# Patient Record
Sex: Female | Born: 1975
Health system: Southern US, Community
[De-identification: ages and names within clinical notes are randomized; demographics above are authoritative.]

## PROBLEM LIST (undated history)

## (undated) DIAGNOSIS — N83209 Unspecified ovarian cyst, unspecified side: Secondary | ICD-10-CM

## (undated) DIAGNOSIS — N760 Acute vaginitis: Secondary | ICD-10-CM

## (undated) DIAGNOSIS — B9689 Other specified bacterial agents as the cause of diseases classified elsewhere: Secondary | ICD-10-CM

## (undated) DIAGNOSIS — B3731 Acute candidiasis of vulva and vagina: Secondary | ICD-10-CM

## (undated) DIAGNOSIS — Z87442 Personal history of urinary calculi: Secondary | ICD-10-CM

## (undated) DIAGNOSIS — N390 Urinary tract infection, site not specified: Secondary | ICD-10-CM

## (undated) DIAGNOSIS — N119 Chronic tubulo-interstitial nephritis, unspecified: Secondary | ICD-10-CM

## (undated) DIAGNOSIS — N200 Calculus of kidney: Secondary | ICD-10-CM

## (undated) DIAGNOSIS — T8859XA Other complications of anesthesia, initial encounter: Secondary | ICD-10-CM

## (undated) DIAGNOSIS — J45909 Unspecified asthma, uncomplicated: Secondary | ICD-10-CM

## (undated) DIAGNOSIS — I10 Essential (primary) hypertension: Secondary | ICD-10-CM

## (undated) DIAGNOSIS — T4145XA Adverse effect of unspecified anesthetic, initial encounter: Secondary | ICD-10-CM

## (undated) DIAGNOSIS — B373 Candidiasis of vulva and vagina: Secondary | ICD-10-CM

## (undated) HISTORY — PX: CYSTOSCOPY: SUR368

## (undated) HISTORY — PX: APPENDECTOMY: SHX54

## (undated) HISTORY — PX: OTHER SURGICAL HISTORY: SHX169

---

## 1999-05-27 ENCOUNTER — Inpatient Hospital Stay (HOSPITAL_COMMUNITY): Admission: AD | Admit: 1999-05-27 | Discharge: 1999-05-29 | Payer: Self-pay | Admitting: *Deleted

## 1999-06-02 ENCOUNTER — Encounter: Payer: Self-pay | Admitting: Family Medicine

## 1999-06-02 ENCOUNTER — Ambulatory Visit (HOSPITAL_COMMUNITY): Admission: RE | Admit: 1999-06-02 | Discharge: 1999-06-02 | Payer: Self-pay | Admitting: Family Medicine

## 1999-06-05 ENCOUNTER — Encounter: Admission: RE | Admit: 1999-06-05 | Discharge: 1999-06-05 | Payer: Self-pay | Admitting: Obstetrics

## 1999-06-12 ENCOUNTER — Inpatient Hospital Stay (HOSPITAL_COMMUNITY): Admission: AD | Admit: 1999-06-12 | Discharge: 1999-06-17 | Payer: Self-pay | Admitting: *Deleted

## 1999-06-20 HISTORY — PX: LITHOTRIPSY: SUR834

## 1999-06-22 ENCOUNTER — Inpatient Hospital Stay (HOSPITAL_COMMUNITY): Admission: AD | Admit: 1999-06-22 | Discharge: 1999-06-26 | Payer: Self-pay | Admitting: Obstetrics

## 1999-06-23 ENCOUNTER — Encounter: Payer: Self-pay | Admitting: Obstetrics

## 1999-06-25 ENCOUNTER — Encounter: Admission: RE | Admit: 1999-06-25 | Discharge: 1999-06-25 | Payer: Self-pay | Admitting: Obstetrics & Gynecology

## 1999-06-25 ENCOUNTER — Encounter: Payer: Self-pay | Admitting: Obstetrics

## 1999-06-26 ENCOUNTER — Encounter: Payer: Self-pay | Admitting: *Deleted

## 1999-07-01 ENCOUNTER — Inpatient Hospital Stay (HOSPITAL_COMMUNITY): Admission: AD | Admit: 1999-07-01 | Discharge: 1999-07-01 | Payer: Self-pay | Admitting: *Deleted

## 1999-07-02 ENCOUNTER — Inpatient Hospital Stay (HOSPITAL_COMMUNITY): Admission: AD | Admit: 1999-07-02 | Discharge: 1999-07-02 | Payer: Self-pay | Admitting: Obstetrics

## 1999-07-02 ENCOUNTER — Encounter: Payer: Self-pay | Admitting: Obstetrics

## 1999-07-02 ENCOUNTER — Encounter (HOSPITAL_COMMUNITY): Admission: RE | Admit: 1999-07-02 | Discharge: 1999-08-18 | Payer: Self-pay | Admitting: Obstetrics & Gynecology

## 1999-07-02 ENCOUNTER — Encounter: Admission: RE | Admit: 1999-07-02 | Discharge: 1999-07-02 | Payer: Self-pay | Admitting: Obstetrics & Gynecology

## 1999-07-16 ENCOUNTER — Inpatient Hospital Stay (HOSPITAL_COMMUNITY): Admission: AD | Admit: 1999-07-16 | Discharge: 1999-07-16 | Payer: Self-pay | Admitting: Obstetrics

## 1999-07-19 ENCOUNTER — Inpatient Hospital Stay (HOSPITAL_COMMUNITY): Admission: AD | Admit: 1999-07-19 | Discharge: 1999-07-23 | Payer: Self-pay | Admitting: *Deleted

## 1999-07-30 ENCOUNTER — Encounter: Admission: RE | Admit: 1999-07-30 | Discharge: 1999-07-30 | Payer: Self-pay | Admitting: Obstetrics & Gynecology

## 1999-08-02 ENCOUNTER — Inpatient Hospital Stay (HOSPITAL_COMMUNITY): Admission: AD | Admit: 1999-08-02 | Discharge: 1999-08-02 | Payer: Self-pay | Admitting: *Deleted

## 1999-08-13 ENCOUNTER — Encounter: Admission: RE | Admit: 1999-08-13 | Discharge: 1999-08-13 | Payer: Self-pay | Admitting: Obstetrics & Gynecology

## 1999-08-13 ENCOUNTER — Inpatient Hospital Stay (HOSPITAL_COMMUNITY): Admission: AD | Admit: 1999-08-13 | Discharge: 1999-08-13 | Payer: Self-pay | Admitting: Obstetrics

## 1999-08-15 ENCOUNTER — Inpatient Hospital Stay (HOSPITAL_COMMUNITY): Admission: AD | Admit: 1999-08-15 | Discharge: 1999-08-17 | Payer: Self-pay | Admitting: Obstetrics & Gynecology

## 1999-08-25 ENCOUNTER — Ambulatory Visit (HOSPITAL_COMMUNITY): Admission: RE | Admit: 1999-08-25 | Discharge: 1999-08-25 | Payer: Self-pay | Admitting: Urology

## 1999-08-25 ENCOUNTER — Encounter: Payer: Self-pay | Admitting: Urology

## 1999-09-15 ENCOUNTER — Emergency Department (HOSPITAL_COMMUNITY): Admission: EM | Admit: 1999-09-15 | Discharge: 1999-09-15 | Payer: Self-pay | Admitting: Emergency Medicine

## 1999-09-22 ENCOUNTER — Encounter: Payer: Self-pay | Admitting: Urology

## 1999-09-22 ENCOUNTER — Ambulatory Visit (HOSPITAL_COMMUNITY): Admission: RE | Admit: 1999-09-22 | Discharge: 1999-09-22 | Payer: Self-pay | Admitting: Urology

## 1999-09-30 ENCOUNTER — Encounter: Admission: RE | Admit: 1999-09-30 | Discharge: 1999-09-30 | Payer: Self-pay | Admitting: Obstetrics & Gynecology

## 1999-10-08 ENCOUNTER — Ambulatory Visit (HOSPITAL_COMMUNITY): Admission: RE | Admit: 1999-10-08 | Discharge: 1999-10-08 | Payer: Self-pay | Admitting: Urology

## 1999-10-08 ENCOUNTER — Encounter: Payer: Self-pay | Admitting: Urology

## 1999-10-09 ENCOUNTER — Encounter: Payer: Self-pay | Admitting: Urology

## 1999-10-09 ENCOUNTER — Ambulatory Visit (HOSPITAL_COMMUNITY): Admission: RE | Admit: 1999-10-09 | Discharge: 1999-10-09 | Payer: Self-pay | Admitting: Urology

## 1999-10-22 ENCOUNTER — Encounter: Payer: Self-pay | Admitting: Urology

## 1999-10-22 ENCOUNTER — Ambulatory Visit (HOSPITAL_COMMUNITY): Admission: RE | Admit: 1999-10-22 | Discharge: 1999-10-22 | Payer: Self-pay | Admitting: Family Medicine

## 1999-10-29 ENCOUNTER — Encounter: Payer: Self-pay | Admitting: Urology

## 1999-10-29 ENCOUNTER — Ambulatory Visit (HOSPITAL_COMMUNITY): Admission: RE | Admit: 1999-10-29 | Discharge: 1999-10-29 | Payer: Self-pay | Admitting: Urology

## 1999-11-20 ENCOUNTER — Encounter: Payer: Self-pay | Admitting: Emergency Medicine

## 1999-11-21 ENCOUNTER — Inpatient Hospital Stay (HOSPITAL_COMMUNITY): Admission: EM | Admit: 1999-11-21 | Discharge: 1999-11-26 | Payer: Self-pay | Admitting: Emergency Medicine

## 1999-11-24 ENCOUNTER — Encounter: Payer: Self-pay | Admitting: Urology

## 1999-12-02 ENCOUNTER — Ambulatory Visit (HOSPITAL_COMMUNITY): Admission: RE | Admit: 1999-12-02 | Discharge: 1999-12-02 | Payer: Self-pay | Admitting: Urology

## 1999-12-18 ENCOUNTER — Ambulatory Visit (HOSPITAL_COMMUNITY): Admission: RE | Admit: 1999-12-18 | Discharge: 1999-12-18 | Payer: Self-pay | Admitting: Urology

## 2000-05-03 ENCOUNTER — Encounter: Payer: Self-pay | Admitting: Emergency Medicine

## 2000-05-03 ENCOUNTER — Emergency Department (HOSPITAL_COMMUNITY): Admission: EM | Admit: 2000-05-03 | Discharge: 2000-05-03 | Payer: Self-pay | Admitting: Emergency Medicine

## 2000-06-07 ENCOUNTER — Inpatient Hospital Stay (HOSPITAL_COMMUNITY): Admission: AD | Admit: 2000-06-07 | Discharge: 2000-06-07 | Payer: Self-pay | Admitting: Obstetrics

## 2000-06-20 ENCOUNTER — Encounter: Payer: Self-pay | Admitting: *Deleted

## 2000-06-20 ENCOUNTER — Inpatient Hospital Stay (HOSPITAL_COMMUNITY): Admission: AD | Admit: 2000-06-20 | Discharge: 2000-06-23 | Payer: Self-pay | Admitting: *Deleted

## 2000-06-25 ENCOUNTER — Encounter: Payer: Self-pay | Admitting: Urology

## 2000-06-25 ENCOUNTER — Inpatient Hospital Stay (HOSPITAL_COMMUNITY): Admission: AD | Admit: 2000-06-25 | Discharge: 2000-06-25 | Payer: Self-pay | Admitting: Obstetrics & Gynecology

## 2000-06-25 ENCOUNTER — Ambulatory Visit (HOSPITAL_COMMUNITY): Admission: RE | Admit: 2000-06-25 | Discharge: 2000-06-25 | Payer: Self-pay | Admitting: Urology

## 2000-06-29 ENCOUNTER — Inpatient Hospital Stay (HOSPITAL_COMMUNITY): Admission: AD | Admit: 2000-06-29 | Discharge: 2000-06-29 | Payer: Self-pay | Admitting: *Deleted

## 2000-06-30 ENCOUNTER — Emergency Department (HOSPITAL_COMMUNITY): Admission: EM | Admit: 2000-06-30 | Discharge: 2000-06-30 | Payer: Self-pay | Admitting: Emergency Medicine

## 2000-07-15 ENCOUNTER — Encounter: Admission: RE | Admit: 2000-07-15 | Discharge: 2000-07-15 | Payer: Self-pay | Admitting: Obstetrics

## 2000-07-21 ENCOUNTER — Encounter: Admission: RE | Admit: 2000-07-21 | Discharge: 2000-07-21 | Payer: Self-pay | Admitting: Obstetrics & Gynecology

## 2000-07-30 ENCOUNTER — Encounter: Payer: Self-pay | Admitting: Emergency Medicine

## 2000-07-30 ENCOUNTER — Encounter: Payer: Self-pay | Admitting: Obstetrics

## 2000-07-31 ENCOUNTER — Inpatient Hospital Stay (HOSPITAL_COMMUNITY): Admission: AD | Admit: 2000-07-31 | Discharge: 2000-08-02 | Payer: Self-pay | Admitting: Obstetrics

## 2000-10-06 ENCOUNTER — Encounter: Admission: RE | Admit: 2000-10-06 | Discharge: 2000-10-06 | Payer: Self-pay | Admitting: Obstetrics & Gynecology

## 2000-10-14 ENCOUNTER — Inpatient Hospital Stay (HOSPITAL_COMMUNITY): Admission: AD | Admit: 2000-10-14 | Discharge: 2000-10-20 | Payer: Self-pay | Admitting: Obstetrics & Gynecology

## 2000-10-15 ENCOUNTER — Encounter: Payer: Self-pay | Admitting: *Deleted

## 2000-10-15 ENCOUNTER — Encounter: Payer: Self-pay | Admitting: Urology

## 2000-10-16 ENCOUNTER — Encounter: Payer: Self-pay | Admitting: *Deleted

## 2000-10-18 ENCOUNTER — Encounter: Payer: Self-pay | Admitting: Urology

## 2000-10-19 HISTORY — PX: TUBAL LIGATION: SHX77

## 2000-10-20 ENCOUNTER — Inpatient Hospital Stay (HOSPITAL_COMMUNITY): Admission: AD | Admit: 2000-10-20 | Discharge: 2000-10-20 | Payer: Self-pay | Admitting: Obstetrics

## 2000-10-28 ENCOUNTER — Inpatient Hospital Stay (HOSPITAL_COMMUNITY): Admission: AD | Admit: 2000-10-28 | Discharge: 2000-10-28 | Payer: Self-pay | Admitting: Obstetrics & Gynecology

## 2000-10-29 ENCOUNTER — Encounter (INDEPENDENT_AMBULATORY_CARE_PROVIDER_SITE_OTHER): Payer: Self-pay | Admitting: Specialist

## 2000-10-29 ENCOUNTER — Inpatient Hospital Stay (HOSPITAL_COMMUNITY): Admission: AD | Admit: 2000-10-29 | Discharge: 2000-10-31 | Payer: Self-pay | Admitting: *Deleted

## 2000-11-24 ENCOUNTER — Encounter: Payer: Self-pay | Admitting: Urology

## 2000-11-24 ENCOUNTER — Ambulatory Visit (HOSPITAL_COMMUNITY): Admission: RE | Admit: 2000-11-24 | Discharge: 2000-11-24 | Payer: Self-pay | Admitting: Urology

## 2000-12-02 ENCOUNTER — Encounter: Payer: Self-pay | Admitting: Urology

## 2000-12-02 ENCOUNTER — Ambulatory Visit (HOSPITAL_COMMUNITY): Admission: RE | Admit: 2000-12-02 | Discharge: 2000-12-02 | Payer: Self-pay | Admitting: Urology

## 2000-12-15 ENCOUNTER — Ambulatory Visit (HOSPITAL_COMMUNITY): Admission: RE | Admit: 2000-12-15 | Discharge: 2000-12-15 | Payer: Self-pay | Admitting: Urology

## 2000-12-15 ENCOUNTER — Encounter: Payer: Self-pay | Admitting: Urology

## 2002-03-25 ENCOUNTER — Encounter: Payer: Self-pay | Admitting: Emergency Medicine

## 2002-03-25 ENCOUNTER — Emergency Department (HOSPITAL_COMMUNITY): Admission: EM | Admit: 2002-03-25 | Discharge: 2002-03-26 | Payer: Self-pay | Admitting: *Deleted

## 2002-05-12 ENCOUNTER — Emergency Department (HOSPITAL_COMMUNITY): Admission: EM | Admit: 2002-05-12 | Discharge: 2002-05-12 | Payer: Self-pay | Admitting: Emergency Medicine

## 2002-06-07 ENCOUNTER — Emergency Department (HOSPITAL_COMMUNITY): Admission: EM | Admit: 2002-06-07 | Discharge: 2002-06-07 | Payer: Self-pay | Admitting: Emergency Medicine

## 2002-08-07 ENCOUNTER — Emergency Department (HOSPITAL_COMMUNITY): Admission: EM | Admit: 2002-08-07 | Discharge: 2002-08-07 | Payer: Self-pay | Admitting: Emergency Medicine

## 2002-12-06 ENCOUNTER — Encounter: Payer: Self-pay | Admitting: *Deleted

## 2002-12-07 ENCOUNTER — Encounter: Payer: Self-pay | Admitting: Urology

## 2002-12-07 ENCOUNTER — Observation Stay (HOSPITAL_COMMUNITY): Admission: AD | Admit: 2002-12-07 | Discharge: 2002-12-09 | Payer: Self-pay | Admitting: Urology

## 2003-01-03 ENCOUNTER — Emergency Department (HOSPITAL_COMMUNITY): Admission: EM | Admit: 2003-01-03 | Discharge: 2003-01-03 | Payer: Self-pay | Admitting: Emergency Medicine

## 2003-01-22 ENCOUNTER — Ambulatory Visit (HOSPITAL_BASED_OUTPATIENT_CLINIC_OR_DEPARTMENT_OTHER): Admission: RE | Admit: 2003-01-22 | Discharge: 2003-01-22 | Payer: Self-pay | Admitting: Urology

## 2003-01-25 ENCOUNTER — Emergency Department (HOSPITAL_COMMUNITY): Admission: AD | Admit: 2003-01-25 | Discharge: 2003-01-25 | Payer: Self-pay | Admitting: Emergency Medicine

## 2003-03-04 ENCOUNTER — Emergency Department (HOSPITAL_COMMUNITY): Admission: EM | Admit: 2003-03-04 | Discharge: 2003-03-04 | Payer: Self-pay

## 2003-04-14 ENCOUNTER — Emergency Department (HOSPITAL_COMMUNITY): Admission: AD | Admit: 2003-04-14 | Discharge: 2003-04-14 | Payer: Self-pay | Admitting: Emergency Medicine

## 2003-04-16 ENCOUNTER — Observation Stay (HOSPITAL_COMMUNITY): Admission: EM | Admit: 2003-04-16 | Discharge: 2003-04-17 | Payer: Self-pay | Admitting: Emergency Medicine

## 2004-02-27 ENCOUNTER — Emergency Department (HOSPITAL_COMMUNITY): Admission: EM | Admit: 2004-02-27 | Discharge: 2004-02-27 | Payer: Self-pay | Admitting: Emergency Medicine

## 2004-07-05 ENCOUNTER — Emergency Department (HOSPITAL_COMMUNITY): Admission: EM | Admit: 2004-07-05 | Discharge: 2004-07-05 | Payer: Self-pay | Admitting: Emergency Medicine

## 2005-01-08 ENCOUNTER — Encounter: Admission: RE | Admit: 2005-01-08 | Discharge: 2005-01-08 | Payer: Self-pay | Admitting: Orthopedic Surgery

## 2005-04-27 ENCOUNTER — Emergency Department (HOSPITAL_COMMUNITY): Admission: EM | Admit: 2005-04-27 | Discharge: 2005-04-27 | Payer: Self-pay | Admitting: Emergency Medicine

## 2005-04-28 ENCOUNTER — Emergency Department (HOSPITAL_COMMUNITY): Admission: EM | Admit: 2005-04-28 | Discharge: 2005-04-29 | Payer: Self-pay | Admitting: Emergency Medicine

## 2006-02-15 ENCOUNTER — Emergency Department (HOSPITAL_COMMUNITY): Admission: EM | Admit: 2006-02-15 | Discharge: 2006-02-16 | Payer: Self-pay | Admitting: Emergency Medicine

## 2006-02-20 ENCOUNTER — Inpatient Hospital Stay (HOSPITAL_COMMUNITY): Admission: EM | Admit: 2006-02-20 | Discharge: 2006-02-25 | Payer: Self-pay | Admitting: Emergency Medicine

## 2006-02-26 ENCOUNTER — Emergency Department (HOSPITAL_COMMUNITY): Admission: EM | Admit: 2006-02-26 | Discharge: 2006-02-26 | Payer: Self-pay | Admitting: Emergency Medicine

## 2006-02-27 ENCOUNTER — Emergency Department (HOSPITAL_COMMUNITY): Admission: EM | Admit: 2006-02-27 | Discharge: 2006-02-28 | Payer: Self-pay | Admitting: Emergency Medicine

## 2006-03-31 ENCOUNTER — Emergency Department (HOSPITAL_COMMUNITY): Admission: EM | Admit: 2006-03-31 | Discharge: 2006-04-01 | Payer: Self-pay | Admitting: Emergency Medicine

## 2006-04-14 ENCOUNTER — Emergency Department (HOSPITAL_COMMUNITY): Admission: EM | Admit: 2006-04-14 | Discharge: 2006-04-15 | Payer: Self-pay | Admitting: Emergency Medicine

## 2006-04-16 ENCOUNTER — Inpatient Hospital Stay (HOSPITAL_COMMUNITY): Admission: EM | Admit: 2006-04-16 | Discharge: 2006-04-19 | Payer: Self-pay | Admitting: Emergency Medicine

## 2006-05-13 ENCOUNTER — Emergency Department (HOSPITAL_COMMUNITY): Admission: EM | Admit: 2006-05-13 | Discharge: 2006-05-13 | Payer: Self-pay | Admitting: Emergency Medicine

## 2007-09-23 ENCOUNTER — Emergency Department (HOSPITAL_COMMUNITY): Admission: EM | Admit: 2007-09-23 | Discharge: 2007-09-23 | Payer: Self-pay | Admitting: Family Medicine

## 2007-10-26 ENCOUNTER — Emergency Department (HOSPITAL_COMMUNITY): Admission: EM | Admit: 2007-10-26 | Discharge: 2007-10-26 | Payer: Self-pay | Admitting: Emergency Medicine

## 2007-10-27 ENCOUNTER — Inpatient Hospital Stay (HOSPITAL_COMMUNITY): Admission: EM | Admit: 2007-10-27 | Discharge: 2007-10-30 | Payer: Self-pay | Admitting: Emergency Medicine

## 2007-11-25 ENCOUNTER — Emergency Department (HOSPITAL_COMMUNITY): Admission: EM | Admit: 2007-11-25 | Discharge: 2007-11-25 | Payer: Self-pay | Admitting: Emergency Medicine

## 2007-12-18 ENCOUNTER — Emergency Department (HOSPITAL_COMMUNITY): Admission: EM | Admit: 2007-12-18 | Discharge: 2007-12-18 | Payer: Self-pay | Admitting: Emergency Medicine

## 2008-01-14 ENCOUNTER — Emergency Department (HOSPITAL_COMMUNITY): Admission: EM | Admit: 2008-01-14 | Discharge: 2008-01-14 | Payer: Self-pay | Admitting: Emergency Medicine

## 2008-01-28 ENCOUNTER — Emergency Department (HOSPITAL_COMMUNITY): Admission: EM | Admit: 2008-01-28 | Discharge: 2008-01-28 | Payer: Self-pay | Admitting: Emergency Medicine

## 2008-03-24 ENCOUNTER — Emergency Department (HOSPITAL_COMMUNITY): Admission: EM | Admit: 2008-03-24 | Discharge: 2008-03-24 | Payer: Self-pay | Admitting: Emergency Medicine

## 2008-03-29 ENCOUNTER — Emergency Department (HOSPITAL_COMMUNITY): Admission: EM | Admit: 2008-03-29 | Discharge: 2008-03-29 | Payer: Self-pay | Admitting: Emergency Medicine

## 2008-04-21 ENCOUNTER — Emergency Department (HOSPITAL_COMMUNITY): Admission: EM | Admit: 2008-04-21 | Discharge: 2008-04-21 | Payer: Self-pay | Admitting: Emergency Medicine

## 2008-05-14 ENCOUNTER — Emergency Department (HOSPITAL_COMMUNITY): Admission: EM | Admit: 2008-05-14 | Discharge: 2008-05-14 | Payer: Self-pay | Admitting: Emergency Medicine

## 2008-05-30 ENCOUNTER — Emergency Department (HOSPITAL_COMMUNITY): Admission: EM | Admit: 2008-05-30 | Discharge: 2008-05-30 | Payer: Self-pay | Admitting: Emergency Medicine

## 2008-06-13 ENCOUNTER — Emergency Department (HOSPITAL_COMMUNITY): Admission: EM | Admit: 2008-06-13 | Discharge: 2008-06-13 | Payer: Self-pay | Admitting: Emergency Medicine

## 2008-07-08 ENCOUNTER — Emergency Department (HOSPITAL_COMMUNITY): Admission: EM | Admit: 2008-07-08 | Discharge: 2008-07-09 | Payer: Self-pay | Admitting: Emergency Medicine

## 2008-07-11 ENCOUNTER — Emergency Department (HOSPITAL_COMMUNITY): Admission: EM | Admit: 2008-07-11 | Discharge: 2008-07-11 | Payer: Self-pay | Admitting: Emergency Medicine

## 2008-09-02 ENCOUNTER — Emergency Department (HOSPITAL_COMMUNITY): Admission: EM | Admit: 2008-09-02 | Discharge: 2008-09-02 | Payer: Self-pay | Admitting: Emergency Medicine

## 2008-09-28 ENCOUNTER — Emergency Department (HOSPITAL_COMMUNITY): Admission: EM | Admit: 2008-09-28 | Discharge: 2008-09-28 | Payer: Self-pay | Admitting: Emergency Medicine

## 2008-11-24 ENCOUNTER — Emergency Department (HOSPITAL_COMMUNITY): Admission: EM | Admit: 2008-11-24 | Discharge: 2008-11-24 | Payer: Self-pay | Admitting: Emergency Medicine

## 2009-02-11 ENCOUNTER — Inpatient Hospital Stay (HOSPITAL_COMMUNITY): Admission: EM | Admit: 2009-02-11 | Discharge: 2009-02-14 | Payer: Self-pay | Admitting: Emergency Medicine

## 2009-03-07 ENCOUNTER — Inpatient Hospital Stay (HOSPITAL_COMMUNITY): Admission: EM | Admit: 2009-03-07 | Discharge: 2009-03-08 | Payer: Self-pay | Admitting: Emergency Medicine

## 2009-03-29 ENCOUNTER — Emergency Department (HOSPITAL_COMMUNITY): Admission: EM | Admit: 2009-03-29 | Discharge: 2009-03-30 | Payer: Self-pay | Admitting: Emergency Medicine

## 2009-04-13 ENCOUNTER — Inpatient Hospital Stay (HOSPITAL_COMMUNITY): Admission: EM | Admit: 2009-04-13 | Discharge: 2009-04-17 | Payer: Self-pay | Admitting: Emergency Medicine

## 2009-06-07 ENCOUNTER — Inpatient Hospital Stay (HOSPITAL_COMMUNITY): Admission: EM | Admit: 2009-06-07 | Discharge: 2009-06-09 | Payer: Self-pay | Admitting: Emergency Medicine

## 2009-06-10 ENCOUNTER — Emergency Department (HOSPITAL_COMMUNITY): Admission: EM | Admit: 2009-06-10 | Discharge: 2009-06-10 | Payer: Self-pay | Admitting: Emergency Medicine

## 2009-07-15 ENCOUNTER — Emergency Department (HOSPITAL_COMMUNITY): Admission: EM | Admit: 2009-07-15 | Discharge: 2009-07-15 | Payer: Self-pay | Admitting: Emergency Medicine

## 2009-07-17 ENCOUNTER — Emergency Department (HOSPITAL_COMMUNITY): Admission: EM | Admit: 2009-07-17 | Discharge: 2009-07-17 | Payer: Self-pay | Admitting: Emergency Medicine

## 2009-07-30 ENCOUNTER — Emergency Department (HOSPITAL_COMMUNITY): Admission: EM | Admit: 2009-07-30 | Discharge: 2009-07-30 | Payer: Self-pay | Admitting: Emergency Medicine

## 2009-10-07 ENCOUNTER — Emergency Department (HOSPITAL_COMMUNITY): Admission: EM | Admit: 2009-10-07 | Discharge: 2009-10-07 | Payer: Self-pay | Admitting: Emergency Medicine

## 2009-10-31 ENCOUNTER — Emergency Department (HOSPITAL_COMMUNITY): Admission: EM | Admit: 2009-10-31 | Discharge: 2009-11-01 | Payer: Self-pay | Admitting: Emergency Medicine

## 2010-01-20 ENCOUNTER — Inpatient Hospital Stay (HOSPITAL_COMMUNITY): Admission: EM | Admit: 2010-01-20 | Discharge: 2010-01-23 | Payer: Self-pay | Admitting: Emergency Medicine

## 2010-02-07 ENCOUNTER — Inpatient Hospital Stay (HOSPITAL_COMMUNITY): Admission: EM | Admit: 2010-02-07 | Discharge: 2010-02-12 | Payer: Self-pay | Admitting: Emergency Medicine

## 2010-03-04 ENCOUNTER — Emergency Department (HOSPITAL_COMMUNITY): Admission: EM | Admit: 2010-03-04 | Discharge: 2010-03-04 | Payer: Self-pay | Admitting: Emergency Medicine

## 2010-04-09 ENCOUNTER — Emergency Department (HOSPITAL_COMMUNITY): Admission: EM | Admit: 2010-04-09 | Discharge: 2010-04-09 | Payer: Self-pay | Admitting: Emergency Medicine

## 2010-04-11 ENCOUNTER — Emergency Department (HOSPITAL_COMMUNITY): Admission: EM | Admit: 2010-04-11 | Discharge: 2010-04-11 | Payer: Self-pay | Admitting: Emergency Medicine

## 2010-05-12 ENCOUNTER — Emergency Department (HOSPITAL_COMMUNITY): Admission: EM | Admit: 2010-05-12 | Discharge: 2010-05-12 | Payer: Self-pay | Admitting: Emergency Medicine

## 2010-06-08 ENCOUNTER — Emergency Department (HOSPITAL_COMMUNITY): Admission: EM | Admit: 2010-06-08 | Discharge: 2010-06-08 | Payer: Self-pay | Admitting: Emergency Medicine

## 2010-07-07 ENCOUNTER — Emergency Department (HOSPITAL_COMMUNITY): Admission: EM | Admit: 2010-07-07 | Discharge: 2010-07-07 | Payer: Self-pay | Admitting: Emergency Medicine

## 2010-07-10 ENCOUNTER — Emergency Department (HOSPITAL_COMMUNITY): Admission: EM | Admit: 2010-07-10 | Discharge: 2010-07-10 | Payer: Self-pay | Admitting: Emergency Medicine

## 2010-07-23 ENCOUNTER — Emergency Department (HOSPITAL_COMMUNITY): Admission: EM | Admit: 2010-07-23 | Discharge: 2010-07-23 | Payer: Self-pay | Admitting: Emergency Medicine

## 2010-08-12 ENCOUNTER — Emergency Department (HOSPITAL_COMMUNITY): Admission: EM | Admit: 2010-08-12 | Discharge: 2010-08-13 | Payer: Self-pay | Admitting: Emergency Medicine

## 2010-09-08 ENCOUNTER — Emergency Department (HOSPITAL_COMMUNITY): Admission: EM | Admit: 2010-09-08 | Discharge: 2010-09-08 | Payer: Self-pay | Admitting: Emergency Medicine

## 2010-09-15 ENCOUNTER — Emergency Department (HOSPITAL_COMMUNITY): Admission: EM | Admit: 2010-09-15 | Discharge: 2010-09-16 | Payer: Self-pay | Admitting: Emergency Medicine

## 2010-10-09 ENCOUNTER — Emergency Department (HOSPITAL_COMMUNITY)
Admission: EM | Admit: 2010-10-09 | Discharge: 2010-10-09 | Payer: Self-pay | Source: Home / Self Care | Admitting: Emergency Medicine

## 2010-10-24 ENCOUNTER — Emergency Department (HOSPITAL_COMMUNITY)
Admission: EM | Admit: 2010-10-24 | Discharge: 2010-10-24 | Payer: Self-pay | Source: Home / Self Care | Admitting: Emergency Medicine

## 2010-11-03 LAB — DIFFERENTIAL
Basophils Absolute: 0 10*3/uL (ref 0.0–0.1)
Basophils Relative: 0 % (ref 0–1)
Eosinophils Absolute: 0.1 10*3/uL (ref 0.0–0.7)
Eosinophils Relative: 1 % (ref 0–5)
Lymphocytes Relative: 39 % (ref 12–46)
Lymphs Abs: 2.2 10*3/uL (ref 0.7–4.0)
Monocytes Absolute: 0.3 10*3/uL (ref 0.1–1.0)
Monocytes Relative: 6 % (ref 3–12)
Neutro Abs: 3 10*3/uL (ref 1.7–7.7)
Neutrophils Relative %: 54 % (ref 43–77)

## 2010-11-03 LAB — URINALYSIS, ROUTINE W REFLEX MICROSCOPIC
Bilirubin Urine: NEGATIVE
Hgb urine dipstick: NEGATIVE
Nitrite: NEGATIVE
Protein, ur: NEGATIVE mg/dL
Specific Gravity, Urine: 1.025 (ref 1.005–1.030)
Urine Glucose, Fasting: NEGATIVE mg/dL
Urobilinogen, UA: 1 mg/dL (ref 0.0–1.0)
pH: 6 (ref 5.0–8.0)

## 2010-11-03 LAB — CBC
HCT: 37 % (ref 36.0–46.0)
Hemoglobin: 12.3 g/dL (ref 12.0–15.0)
MCH: 32.1 pg (ref 26.0–34.0)
MCHC: 33.2 g/dL (ref 30.0–36.0)
MCV: 96.6 fL (ref 78.0–100.0)
Platelets: 255 10*3/uL (ref 150–400)
RBC: 3.83 MIL/uL — ABNORMAL LOW (ref 3.87–5.11)
RDW: 12.7 % (ref 11.5–15.5)
WBC: 5.6 10*3/uL (ref 4.0–10.5)

## 2010-11-03 LAB — BASIC METABOLIC PANEL
BUN: 8 mg/dL (ref 6–23)
CO2: 23 mEq/L (ref 19–32)
Calcium: 9 mg/dL (ref 8.4–10.5)
Chloride: 110 mEq/L (ref 96–112)
Creatinine, Ser: 0.89 mg/dL (ref 0.4–1.2)
GFR calc Af Amer: >60 mL/min (ref >60)
GFR calc non Af Amer: >60 mL/min (ref >60)
Glucose, Bld: 87 mg/dL (ref 70–99)
Potassium: 4 mEq/L (ref 3.5–5.1)
Sodium: 140 mEq/L (ref 135–145)

## 2010-11-03 LAB — URINE MICROSCOPIC-ADD ON

## 2010-11-03 LAB — POCT PREGNANCY, URINE: Preg Test, Ur: NEGATIVE

## 2010-12-29 LAB — URINALYSIS, ROUTINE W REFLEX MICROSCOPIC
Glucose, UA: NEGATIVE mg/dL
Ketones, ur: NEGATIVE mg/dL
Protein, ur: NEGATIVE mg/dL
Urobilinogen, UA: 1 mg/dL (ref 0.0–1.0)

## 2010-12-29 LAB — POCT I-STAT, CHEM 8
BUN: 10 mg/dL (ref 6–23)
Calcium, Ion: 1.16 mmol/L (ref 1.12–1.32)
Creatinine, Ser: 0.9 mg/dL (ref 0.4–1.2)
Hemoglobin: 12.9 g/dL (ref 12.0–15.0)
TCO2: 25 mmol/L (ref 0–100)

## 2010-12-29 LAB — URINE CULTURE: Colony Count: >=100000

## 2010-12-29 LAB — POCT PREGNANCY, URINE: Preg Test, Ur: NEGATIVE

## 2010-12-31 LAB — DIFFERENTIAL
Basophils Relative: 0 % (ref 0–1)
Lymphocytes Relative: 40 % (ref 12–46)
Lymphocytes Relative: 42 % (ref 12–46)
Lymphs Abs: 2.3 10*3/uL (ref 0.7–4.0)
Lymphs Abs: 2.7 10*3/uL (ref 0.7–4.0)
Monocytes Relative: 5 % (ref 3–12)
Neutro Abs: 2.8 10*3/uL (ref 1.7–7.7)
Neutro Abs: 3.5 10*3/uL (ref 1.7–7.7)
Neutrophils Relative %: 52 % (ref 43–77)
Neutrophils Relative %: 52 % (ref 43–77)

## 2010-12-31 LAB — URINE MICROSCOPIC-ADD ON

## 2010-12-31 LAB — URINALYSIS, ROUTINE W REFLEX MICROSCOPIC
Glucose, UA: NEGATIVE mg/dL
Glucose, UA: NEGATIVE mg/dL
Hgb urine dipstick: NEGATIVE
Ketones, ur: NEGATIVE mg/dL
Protein, ur: NEGATIVE mg/dL
Protein, ur: NEGATIVE mg/dL
pH: 6 (ref 5.0–8.0)

## 2010-12-31 LAB — COMPREHENSIVE METABOLIC PANEL
BUN: 7 mg/dL (ref 6–23)
CO2: 23 mEq/L (ref 19–32)
Calcium: 9.3 mg/dL (ref 8.4–10.5)
Creatinine, Ser: 0.88 mg/dL (ref 0.4–1.2)
GFR calc Af Amer: >60 mL/min (ref >60)
GFR calc non Af Amer: >60 mL/min (ref >60)
Glucose, Bld: 86 mg/dL (ref 70–99)

## 2010-12-31 LAB — CBC
HCT: 37.3 % (ref 36.0–46.0)
Hemoglobin: 12.7 g/dL (ref 12.0–15.0)
Hemoglobin: 13.3 g/dL (ref 12.0–15.0)
MCH: 33.2 pg (ref 26.0–34.0)
MCHC: 34.1 g/dL (ref 30.0–36.0)
RBC: 4 MIL/uL (ref 3.87–5.11)
WBC: 5.5 10*3/uL (ref 4.0–10.5)

## 2010-12-31 LAB — URINE CULTURE
Colony Count: >=100000
Culture  Setup Time: 201110260851

## 2010-12-31 LAB — LIPASE, BLOOD: Lipase: 24 U/L (ref 11–59)

## 2010-12-31 LAB — POCT PREGNANCY, URINE: Preg Test, Ur: NEGATIVE

## 2011-01-01 LAB — URINE MICROSCOPIC-ADD ON

## 2011-01-01 LAB — URINALYSIS, ROUTINE W REFLEX MICROSCOPIC
Glucose, UA: NEGATIVE mg/dL
Glucose, UA: NEGATIVE mg/dL
Ketones, ur: NEGATIVE mg/dL
Ketones, ur: 15 mg/dL — AB
Nitrite: NEGATIVE
Nitrite: NEGATIVE
Protein, ur: NEGATIVE mg/dL
Protein, ur: 30 mg/dL — AB
Specific Gravity, Urine: 1.029 (ref 1.005–1.030)
pH: 6 (ref 5.0–8.0)
pH: 6 (ref 5.0–8.0)
pH: 7 (ref 5.0–8.0)

## 2011-01-01 LAB — POCT I-STAT, CHEM 8
BUN: 7 mg/dL (ref 6–23)
Calcium, Ion: 0.96 mmol/L — ABNORMAL LOW (ref 1.12–1.32)
Chloride: 108 mEq/L (ref 96–112)
Chloride: 110 mEq/L (ref 96–112)
Creatinine, Ser: 0.9 mg/dL (ref 0.4–1.2)
HCT: 45 % (ref 36.0–46.0)
Potassium: 4.2 mEq/L (ref 3.5–5.1)
Sodium: 138 mEq/L (ref 135–145)

## 2011-01-01 LAB — URINE CULTURE
Colony Count: >=100000
Colony Count: >=100000
Culture  Setup Time: 201110060116

## 2011-01-01 LAB — POCT PREGNANCY, URINE: Preg Test, Ur: NEGATIVE

## 2011-01-02 LAB — URINALYSIS, ROUTINE W REFLEX MICROSCOPIC
Glucose, UA: NEGATIVE mg/dL
Hgb urine dipstick: NEGATIVE
Specific Gravity, Urine: 1.025 (ref 1.005–1.030)
pH: 6 (ref 5.0–8.0)

## 2011-01-03 LAB — URINE CULTURE: Colony Count: >=100000

## 2011-01-03 LAB — SAMPLE TO BLOOD BANK

## 2011-01-03 LAB — URINE MICROSCOPIC-ADD ON

## 2011-01-03 LAB — BASIC METABOLIC PANEL
BUN: 8 mg/dL (ref 6–23)
Calcium: 8.9 mg/dL (ref 8.4–10.5)
GFR calc non Af Amer: >60 mL/min (ref >60)
Potassium: 4 mEq/L (ref 3.5–5.1)

## 2011-01-03 LAB — URINALYSIS, ROUTINE W REFLEX MICROSCOPIC
Ketones, ur: NEGATIVE mg/dL
Nitrite: NEGATIVE
Specific Gravity, Urine: 1.019 (ref 1.005–1.030)
Urobilinogen, UA: 0.2 mg/dL (ref 0.0–1.0)
pH: 7 (ref 5.0–8.0)

## 2011-01-03 LAB — PREGNANCY, URINE: Preg Test, Ur: NEGATIVE

## 2011-01-04 LAB — URINE CULTURE: Colony Count: >=100000

## 2011-01-04 LAB — URINALYSIS, ROUTINE W REFLEX MICROSCOPIC
Bilirubin Urine: NEGATIVE
Ketones, ur: NEGATIVE mg/dL
Nitrite: NEGATIVE
Protein, ur: NEGATIVE mg/dL
Urobilinogen, UA: 1 mg/dL (ref 0.0–1.0)
pH: 6 (ref 5.0–8.0)

## 2011-01-04 LAB — URINE MICROSCOPIC-ADD ON

## 2011-01-05 LAB — URINALYSIS, ROUTINE W REFLEX MICROSCOPIC
Bilirubin Urine: NEGATIVE
Bilirubin Urine: NEGATIVE
Glucose, UA: NEGATIVE mg/dL
Hgb urine dipstick: NEGATIVE
Ketones, ur: NEGATIVE mg/dL
Ketones, ur: NEGATIVE mg/dL
Leukocytes, UA: NEGATIVE
Nitrite: NEGATIVE
Nitrite: NEGATIVE
Protein, ur: NEGATIVE mg/dL
Protein, ur: NEGATIVE mg/dL
Specific Gravity, Urine: 1.027 (ref 1.005–1.030)
Urobilinogen, UA: 1 mg/dL (ref 0.0–1.0)
Urobilinogen, UA: 1 mg/dL (ref 0.0–1.0)
pH: 7 (ref 5.0–8.0)

## 2011-01-05 LAB — CBC
HCT: 37.1 % (ref 36.0–46.0)
HCT: 38 % (ref 36.0–46.0)
Hemoglobin: 12.4 g/dL (ref 12.0–15.0)
Hemoglobin: 12.6 g/dL (ref 12.0–15.0)
MCH: 32.3 pg (ref 26.0–34.0)
MCHC: 33.1 g/dL (ref 30.0–36.0)
MCHC: 33.5 g/dL (ref 30.0–36.0)
MCV: 96.7 fL (ref 78.0–100.0)
RBC: 3.84 MIL/uL — ABNORMAL LOW (ref 3.87–5.11)
RDW: 13.6 % (ref 11.5–15.5)

## 2011-01-05 LAB — COMPREHENSIVE METABOLIC PANEL
ALT: 13 U/L (ref 0–35)
ALT: 16 U/L (ref 0–35)
Alkaline Phosphatase: 68 U/L (ref 39–117)
BUN: 8 mg/dL (ref 6–23)
CO2: 21 mEq/L (ref 19–32)
CO2: 25 mEq/L (ref 19–32)
Calcium: 8.7 mg/dL (ref 8.4–10.5)
Calcium: 8.9 mg/dL (ref 8.4–10.5)
Creatinine, Ser: 0.85 mg/dL (ref 0.4–1.2)
GFR calc Af Amer: >60 mL/min (ref >60)
GFR calc non Af Amer: >60 mL/min (ref >60)
GFR calc non Af Amer: >60 mL/min (ref >60)
Glucose, Bld: 114 mg/dL — ABNORMAL HIGH (ref 70–99)
Glucose, Bld: 91 mg/dL (ref 70–99)
Sodium: 134 mEq/L — ABNORMAL LOW (ref 135–145)
Sodium: 139 mEq/L (ref 135–145)
Total Protein: 7.5 g/dL (ref 6.0–8.3)

## 2011-01-05 LAB — URINE CULTURE
Colony Count: NO GROWTH
Culture: NO GROWTH

## 2011-01-05 LAB — DIFFERENTIAL
Basophils Relative: 1 % (ref 0–1)
Eosinophils Absolute: 0 10*3/uL (ref 0.0–0.7)
Lymphocytes Relative: 34 % (ref 12–46)
Lymphs Abs: 1.9 10*3/uL (ref 0.7–4.0)
Lymphs Abs: 2.2 10*3/uL (ref 0.7–4.0)
Monocytes Relative: 5 % (ref 3–12)
Neutrophils Relative %: 56 % (ref 43–77)
Neutrophils Relative %: 59 % (ref 43–77)

## 2011-01-05 LAB — URINE MICROSCOPIC-ADD ON

## 2011-01-05 LAB — POCT PREGNANCY, URINE: Preg Test, Ur: NEGATIVE

## 2011-01-06 LAB — URINALYSIS, ROUTINE W REFLEX MICROSCOPIC
Nitrite: POSITIVE — AB
Protein, ur: NEGATIVE mg/dL

## 2011-01-06 LAB — COMPREHENSIVE METABOLIC PANEL
ALT: 13 U/L (ref 0–35)
Alkaline Phosphatase: 64 U/L (ref 39–117)
CO2: 25 mEq/L (ref 19–32)
Calcium: 8.6 mg/dL (ref 8.4–10.5)
Chloride: 106 mEq/L (ref 96–112)
GFR calc non Af Amer: >60 mL/min (ref >60)
Glucose, Bld: 99 mg/dL (ref 70–99)
Sodium: 139 mEq/L (ref 135–145)
Total Bilirubin: 0.5 mg/dL (ref 0.3–1.2)

## 2011-01-06 LAB — CBC
Hemoglobin: 11.3 g/dL — ABNORMAL LOW (ref 12.0–15.0)
Hemoglobin: 11.6 g/dL — ABNORMAL LOW (ref 12.0–15.0)
Hemoglobin: 11.7 g/dL — ABNORMAL LOW (ref 12.0–15.0)
Hemoglobin: 12.4 g/dL (ref 12.0–15.0)
MCHC: 32.7 g/dL (ref 30.0–36.0)
MCHC: 33 g/dL (ref 30.0–36.0)
MCHC: 33 g/dL (ref 30.0–36.0)
MCHC: 33.3 g/dL (ref 30.0–36.0)
MCHC: 33.4 g/dL (ref 30.0–36.0)
MCV: 97.4 fL (ref 78.0–100.0)
MCV: 98.4 fL (ref 78.0–100.0)
MCV: 99.2 fL (ref 78.0–100.0)
Platelets: 284 10*3/uL (ref 150–400)
Platelets: 288 10*3/uL (ref 150–400)
Platelets: 309 10*3/uL (ref 150–400)
RBC: 3.46 MIL/uL — ABNORMAL LOW (ref 3.87–5.11)
RBC: 3.59 MIL/uL — ABNORMAL LOW (ref 3.87–5.11)
RBC: 3.62 MIL/uL — ABNORMAL LOW (ref 3.87–5.11)
RBC: 3.81 MIL/uL — ABNORMAL LOW (ref 3.87–5.11)
RDW: 13.6 % (ref 11.5–15.5)
RDW: 13.9 % (ref 11.5–15.5)
WBC: 4.3 10*3/uL (ref 4.0–10.5)
WBC: 7.7 10*3/uL (ref 4.0–10.5)

## 2011-01-06 LAB — BASIC METABOLIC PANEL
BUN: 9 mg/dL (ref 6–23)
CO2: 22 mEq/L (ref 19–32)
CO2: 23 mEq/L (ref 19–32)
CO2: 23 mEq/L (ref 19–32)
CO2: 24 mEq/L (ref 19–32)
Calcium: 8.1 mg/dL — ABNORMAL LOW (ref 8.4–10.5)
Calcium: 8.4 mg/dL (ref 8.4–10.5)
Calcium: 9 mg/dL (ref 8.4–10.5)
Chloride: 101 mEq/L (ref 96–112)
Chloride: 104 mEq/L (ref 96–112)
Chloride: 106 mEq/L (ref 96–112)
Creatinine, Ser: 0.85 mg/dL (ref 0.4–1.2)
Creatinine, Ser: 0.87 mg/dL (ref 0.4–1.2)
Creatinine, Ser: 0.93 mg/dL (ref 0.4–1.2)
Creatinine, Ser: 1.02 mg/dL (ref 0.4–1.2)
GFR calc Af Amer: >60 mL/min (ref >60)
GFR calc Af Amer: >60 mL/min (ref >60)
GFR calc non Af Amer: >60 mL/min (ref >60)
GFR calc non Af Amer: >60 mL/min (ref >60)
Glucose, Bld: 160 mg/dL — ABNORMAL HIGH (ref 70–99)
Glucose, Bld: 73 mg/dL (ref 70–99)
Sodium: 137 mEq/L (ref 135–145)
Sodium: 138 mEq/L (ref 135–145)
Sodium: 138 mEq/L (ref 135–145)

## 2011-01-06 LAB — DIFFERENTIAL
Basophils Relative: 0 % (ref 0–1)
Eosinophils Absolute: 0.1 10*3/uL (ref 0.0–0.7)
Monocytes Absolute: 0.3 10*3/uL (ref 0.1–1.0)
Neutrophils Relative %: 62 % (ref 43–77)

## 2011-01-06 LAB — URINE CULTURE

## 2011-01-06 LAB — PREGNANCY, URINE: Preg Test, Ur: NEGATIVE

## 2011-01-07 LAB — BASIC METABOLIC PANEL
BUN: 11 mg/dL (ref 6–23)
CO2: 21 mEq/L (ref 19–32)
CO2: 23 mEq/L (ref 19–32)
Calcium: 8.1 mg/dL — ABNORMAL LOW (ref 8.4–10.5)
Chloride: 107 mEq/L (ref 96–112)
Chloride: 108 mEq/L (ref 96–112)
Creatinine, Ser: 0.87 mg/dL (ref 0.4–1.2)
Creatinine, Ser: 1 mg/dL (ref 0.4–1.2)
GFR calc Af Amer: >60 mL/min (ref >60)
Glucose, Bld: 141 mg/dL — ABNORMAL HIGH (ref 70–99)
Potassium: 5 mEq/L (ref 3.5–5.1)

## 2011-01-07 LAB — HEPATIC FUNCTION PANEL
Alkaline Phosphatase: 57 U/L (ref 39–117)
Indirect Bilirubin: 0.5 mg/dL (ref 0.3–0.9)
Total Bilirubin: 0.7 mg/dL (ref 0.3–1.2)
Total Protein: 6.6 g/dL (ref 6.0–8.3)

## 2011-01-07 LAB — CBC
Hemoglobin: 11.9 g/dL — ABNORMAL LOW (ref 12.0–15.0)
MCHC: 32.6 g/dL (ref 30.0–36.0)
MCHC: 33 g/dL (ref 30.0–36.0)
MCV: 97.5 fL (ref 78.0–100.0)
MCV: 98.7 fL (ref 78.0–100.0)
Platelets: 235 10*3/uL (ref 150–400)
RBC: 3.69 MIL/uL — ABNORMAL LOW (ref 3.87–5.11)
RDW: 13.6 % (ref 11.5–15.5)
RDW: 13.8 % (ref 11.5–15.5)

## 2011-01-07 LAB — URINE CULTURE

## 2011-01-07 LAB — URINALYSIS, ROUTINE W REFLEX MICROSCOPIC
Ketones, ur: NEGATIVE mg/dL
Nitrite: POSITIVE — AB
Protein, ur: NEGATIVE mg/dL
Urobilinogen, UA: 1 mg/dL (ref 0.0–1.0)

## 2011-01-07 LAB — DIFFERENTIAL
Basophils Absolute: 0 10*3/uL (ref 0.0–0.1)
Basophils Relative: 1 % (ref 0–1)
Eosinophils Absolute: 0.1 10*3/uL (ref 0.0–0.7)
Neutro Abs: 1.8 10*3/uL (ref 1.7–7.7)
Neutrophils Relative %: 38 % — ABNORMAL LOW (ref 43–77)

## 2011-01-07 LAB — URINE MICROSCOPIC-ADD ON

## 2011-01-07 LAB — PREGNANCY, URINE: Preg Test, Ur: NEGATIVE

## 2011-01-07 LAB — HEMOGLOBIN A1C
Hgb A1c MFr Bld: 5.6 % (ref 4.6–6.1)
Mean Plasma Glucose: 114 mg/dL

## 2011-01-18 ENCOUNTER — Emergency Department (HOSPITAL_BASED_OUTPATIENT_CLINIC_OR_DEPARTMENT_OTHER)
Admission: EM | Admit: 2011-01-18 | Discharge: 2011-01-18 | Disposition: A | Discharge status: Home / Self Care | Payer: Self-pay | Attending: Emergency Medicine | Admitting: Emergency Medicine

## 2011-01-18 ENCOUNTER — Emergency Department (INDEPENDENT_AMBULATORY_CARE_PROVIDER_SITE_OTHER): Payer: Self-pay

## 2011-01-18 DIAGNOSIS — R109 Unspecified abdominal pain: Secondary | ICD-10-CM

## 2011-01-18 DIAGNOSIS — N390 Urinary tract infection, site not specified: Secondary | ICD-10-CM | POA: Insufficient documentation

## 2011-01-18 DIAGNOSIS — I10 Essential (primary) hypertension: Secondary | ICD-10-CM | POA: Insufficient documentation

## 2011-01-18 LAB — DIFFERENTIAL
Eosinophils Relative: 1 % (ref 0–5)
Lymphocytes Relative: 36 % (ref 12–46)
Lymphs Abs: 2.6 10*3/uL (ref 0.7–4.0)
Monocytes Relative: 11 % (ref 3–12)

## 2011-01-18 LAB — URINALYSIS, ROUTINE W REFLEX MICROSCOPIC
Hgb urine dipstick: NEGATIVE
Nitrite: NEGATIVE
Protein, ur: NEGATIVE mg/dL
Urobilinogen, UA: 1 mg/dL (ref 0.0–1.0)

## 2011-01-18 LAB — URINE MICROSCOPIC-ADD ON

## 2011-01-18 LAB — BASIC METABOLIC PANEL
BUN: 10 mg/dL (ref 6–23)
Chloride: 105 mEq/L (ref 96–112)
GFR calc non Af Amer: >60 mL/min (ref >60)
Glucose, Bld: 71 mg/dL (ref 70–99)
Potassium: 4.3 mEq/L (ref 3.5–5.1)

## 2011-01-18 LAB — CBC
HCT: 34.5 % — ABNORMAL LOW (ref 36.0–46.0)
MCV: 94 fL (ref 78.0–100.0)
RDW: 12.2 % (ref 11.5–15.5)
WBC: 7.2 10*3/uL (ref 4.0–10.5)

## 2011-01-18 LAB — PREGNANCY, URINE: Preg Test, Ur: NEGATIVE

## 2011-01-19 LAB — URINALYSIS, ROUTINE W REFLEX MICROSCOPIC
Glucose, UA: NEGATIVE mg/dL
pH: 5.5 (ref 5.0–8.0)

## 2011-01-19 LAB — URINE MICROSCOPIC-ADD ON

## 2011-01-19 LAB — URINE CULTURE

## 2011-01-19 LAB — DIFFERENTIAL
Eosinophils Absolute: 0.1 10*3/uL (ref 0.0–0.7)
Eosinophils Relative: 1 % (ref 0–5)
Lymphs Abs: 1.9 10*3/uL (ref 0.7–4.0)
Monocytes Relative: 7 % (ref 3–12)
Neutrophils Relative %: 48 % (ref 43–77)

## 2011-01-19 LAB — CBC
HCT: 39.4 % (ref 36.0–46.0)
MCV: 97.9 fL (ref 78.0–100.0)
RBC: 4.02 MIL/uL (ref 3.87–5.11)
WBC: 4.7 10*3/uL (ref 4.0–10.5)

## 2011-01-19 LAB — POCT I-STAT, CHEM 8
BUN: 7 mg/dL (ref 6–23)
Creatinine, Ser: 0.9 mg/dL (ref 0.4–1.2)
Glucose, Bld: 92 mg/dL (ref 70–99)
Hemoglobin: 14.3 g/dL (ref 12.0–15.0)
Potassium: 4 mEq/L (ref 3.5–5.1)

## 2011-01-22 LAB — DIFFERENTIAL
Eosinophils Relative: 1 % (ref 0–5)
Lymphocytes Relative: 30 % (ref 12–46)
Lymphs Abs: 1.6 10*3/uL (ref 0.7–4.0)
Monocytes Absolute: 0.3 10*3/uL (ref 0.1–1.0)

## 2011-01-22 LAB — POCT I-STAT, CHEM 8
BUN: 7 mg/dL (ref 6–23)
Creatinine, Ser: 0.9 mg/dL (ref 0.4–1.2)
Sodium: 138 mEq/L (ref 135–145)
TCO2: 24 mmol/L (ref 0–100)

## 2011-01-22 LAB — URINALYSIS, ROUTINE W REFLEX MICROSCOPIC
Bilirubin Urine: NEGATIVE
Glucose, UA: NEGATIVE mg/dL
Hgb urine dipstick: NEGATIVE
Ketones, ur: NEGATIVE mg/dL
Protein, ur: NEGATIVE mg/dL

## 2011-01-22 LAB — CBC
HCT: 35.8 % — ABNORMAL LOW (ref 36.0–46.0)
Hemoglobin: 12.1 g/dL (ref 12.0–15.0)
Platelets: 267 10*3/uL (ref 150–400)
WBC: 5.4 10*3/uL (ref 4.0–10.5)

## 2011-01-22 LAB — POCT PREGNANCY, URINE: Preg Test, Ur: NEGATIVE

## 2011-01-23 LAB — DIFFERENTIAL
Eosinophils Absolute: 0 10*3/uL (ref 0.0–0.7)
Lymphocytes Relative: 26 % (ref 12–46)
Lymphs Abs: 1.7 10*3/uL (ref 0.7–4.0)
Monocytes Relative: 4 % (ref 3–12)
Neutrophils Relative %: 70 % (ref 43–77)

## 2011-01-23 LAB — POCT I-STAT, CHEM 8
BUN: 10 mg/dL (ref 6–23)
Chloride: 106 mEq/L (ref 96–112)
Creatinine, Ser: 1 mg/dL (ref 0.4–1.2)
Potassium: 4.9 mEq/L (ref 3.5–5.1)
Sodium: 138 mEq/L (ref 135–145)

## 2011-01-23 LAB — CBC
HCT: 39.5 % (ref 36.0–46.0)
MCV: 97.1 fL (ref 78.0–100.0)
Platelets: 306 10*3/uL (ref 150–400)
RBC: 4.06 MIL/uL (ref 3.87–5.11)
WBC: 6.5 10*3/uL (ref 4.0–10.5)

## 2011-01-23 LAB — URINE MICROSCOPIC-ADD ON

## 2011-01-23 LAB — BASIC METABOLIC PANEL
CO2: 25 mEq/L (ref 19–32)
Calcium: 9.1 mg/dL (ref 8.4–10.5)
Chloride: 103 mEq/L (ref 96–112)
GFR calc Af Amer: >60 mL/min (ref >60)
Glucose, Bld: 88 mg/dL (ref 70–99)
Sodium: 137 mEq/L (ref 135–145)

## 2011-01-23 LAB — URINALYSIS, ROUTINE W REFLEX MICROSCOPIC
Glucose, UA: NEGATIVE mg/dL
Glucose, UA: NEGATIVE mg/dL
Hgb urine dipstick: NEGATIVE
Hgb urine dipstick: NEGATIVE
Protein, ur: NEGATIVE mg/dL
Specific Gravity, Urine: 1.02 (ref 1.005–1.030)
pH: 6.5 (ref 5.0–8.0)

## 2011-01-23 LAB — POCT PREGNANCY, URINE
Preg Test, Ur: NEGATIVE
Preg Test, Ur: NEGATIVE

## 2011-01-23 LAB — URINE CULTURE: Colony Count: >=100000

## 2011-01-24 LAB — BASIC METABOLIC PANEL
BUN: 5 mg/dL — ABNORMAL LOW (ref 6–23)
CO2: 22 mEq/L (ref 19–32)
Chloride: 110 mEq/L (ref 96–112)
Creatinine, Ser: 0.9 mg/dL (ref 0.4–1.2)
GFR calc Af Amer: >60 mL/min (ref >60)
GFR calc non Af Amer: 59 mL/min — ABNORMAL LOW (ref >60)
Glucose, Bld: 123 mg/dL — ABNORMAL HIGH (ref 70–99)
Potassium: 3.5 mEq/L (ref 3.5–5.1)
Potassium: 3.6 mEq/L (ref 3.5–5.1)
Sodium: 137 mEq/L (ref 135–145)

## 2011-01-24 LAB — URINALYSIS, ROUTINE W REFLEX MICROSCOPIC
Glucose, UA: NEGATIVE mg/dL
Ketones, ur: NEGATIVE mg/dL
Nitrite: POSITIVE — AB
Specific Gravity, Urine: 1.021 (ref 1.005–1.030)
pH: 7 (ref 5.0–8.0)

## 2011-01-24 LAB — URINE MICROSCOPIC-ADD ON

## 2011-01-24 LAB — CBC
HCT: 34.4 % — ABNORMAL LOW (ref 36.0–46.0)
Hemoglobin: 11.6 g/dL — ABNORMAL LOW (ref 12.0–15.0)
MCHC: 33.8 g/dL (ref 30.0–36.0)
MCHC: 34.1 g/dL (ref 30.0–36.0)
MCV: 97.6 fL (ref 78.0–100.0)
MCV: 98.6 fL (ref 78.0–100.0)
Platelets: 238 10*3/uL (ref 150–400)
RBC: 3.48 MIL/uL — ABNORMAL LOW (ref 3.87–5.11)
RDW: 13.2 % (ref 11.5–15.5)
WBC: 7.3 10*3/uL (ref 4.0–10.5)

## 2011-01-24 LAB — DIFFERENTIAL
Basophils Absolute: 0 10*3/uL (ref 0.0–0.1)
Eosinophils Relative: 1 % (ref 0–5)
Lymphocytes Relative: 40 % (ref 12–46)
Monocytes Absolute: 0.5 10*3/uL (ref 0.1–1.0)

## 2011-01-24 LAB — PREGNANCY, URINE: Preg Test, Ur: NEGATIVE

## 2011-01-24 LAB — URINE CULTURE: Colony Count: >=100000

## 2011-01-26 LAB — URINE MICROSCOPIC-ADD ON

## 2011-01-26 LAB — CBC
HCT: 35.5 % — ABNORMAL LOW (ref 36.0–46.0)
HCT: 39 % (ref 36.0–46.0)
Hemoglobin: 12 g/dL (ref 12.0–15.0)
Hemoglobin: 12.3 g/dL (ref 12.0–15.0)
Hemoglobin: 13.1 g/dL (ref 12.0–15.0)
MCHC: 33.7 g/dL (ref 30.0–36.0)
Platelets: 270 10*3/uL (ref 150–400)
Platelets: 284 10*3/uL (ref 150–400)
RDW: 13.4 % (ref 11.5–15.5)
RDW: 13.7 % (ref 11.5–15.5)
WBC: 5.1 10*3/uL (ref 4.0–10.5)
WBC: 6.2 10*3/uL (ref 4.0–10.5)

## 2011-01-26 LAB — URINALYSIS, ROUTINE W REFLEX MICROSCOPIC
Bilirubin Urine: NEGATIVE
Bilirubin Urine: NEGATIVE
Bilirubin Urine: NEGATIVE
Glucose, UA: NEGATIVE mg/dL
Hgb urine dipstick: NEGATIVE
Hgb urine dipstick: NEGATIVE
Hgb urine dipstick: NEGATIVE
Ketones, ur: NEGATIVE mg/dL
Nitrite: NEGATIVE
Protein, ur: NEGATIVE mg/dL
Protein, ur: NEGATIVE mg/dL
Specific Gravity, Urine: 1.018 (ref 1.005–1.030)
Urobilinogen, UA: 1 mg/dL (ref 0.0–1.0)
Urobilinogen, UA: 1 mg/dL (ref 0.0–1.0)
Urobilinogen, UA: 1 mg/dL (ref 0.0–1.0)
pH: 6 (ref 5.0–8.0)

## 2011-01-26 LAB — BASIC METABOLIC PANEL
BUN: 6 mg/dL (ref 6–23)
CO2: 24 mEq/L (ref 19–32)
Calcium: 8.2 mg/dL — ABNORMAL LOW (ref 8.4–10.5)
Calcium: 8.7 mg/dL (ref 8.4–10.5)
Creatinine, Ser: 0.82 mg/dL (ref 0.4–1.2)
GFR calc Af Amer: >60 mL/min (ref >60)
GFR calc non Af Amer: >60 mL/min (ref >60)
GFR calc non Af Amer: >60 mL/min (ref >60)
Glucose, Bld: 75 mg/dL (ref 70–99)
Glucose, Bld: 98 mg/dL (ref 70–99)
Potassium: 3.8 mEq/L (ref 3.5–5.1)
Sodium: 136 mEq/L (ref 135–145)
Sodium: 139 mEq/L (ref 135–145)

## 2011-01-26 LAB — COMPREHENSIVE METABOLIC PANEL
ALT: 16 U/L (ref 0–35)
Albumin: 3.9 g/dL (ref 3.5–5.2)
Alkaline Phosphatase: 71 U/L (ref 39–117)
BUN: 8 mg/dL (ref 6–23)
Chloride: 106 mEq/L (ref 96–112)
Glucose, Bld: 97 mg/dL (ref 70–99)
Potassium: 4 mEq/L (ref 3.5–5.1)
Sodium: 140 mEq/L (ref 135–145)
Total Bilirubin: 0.5 mg/dL (ref 0.3–1.2)
Total Protein: 7.4 g/dL (ref 6.0–8.3)

## 2011-01-26 LAB — DIFFERENTIAL
Basophils Absolute: 0 10*3/uL (ref 0.0–0.1)
Basophils Relative: 0 % (ref 0–1)
Eosinophils Absolute: 0.1 10*3/uL (ref 0.0–0.7)
Eosinophils Relative: 1 % (ref 0–5)
Monocytes Absolute: 0.3 10*3/uL (ref 0.1–1.0)
Monocytes Relative: 5 % (ref 3–12)
Neutro Abs: 3.2 10*3/uL (ref 1.7–7.7)

## 2011-01-26 LAB — URINE CULTURE: Colony Count: >=100000

## 2011-01-26 LAB — POCT PREGNANCY, URINE: Preg Test, Ur: NEGATIVE

## 2011-01-27 LAB — CBC
HCT: 34.5 % — ABNORMAL LOW (ref 36.0–46.0)
HCT: 36.9 % (ref 36.0–46.0)
Hemoglobin: 11.6 g/dL — ABNORMAL LOW (ref 12.0–15.0)
Hemoglobin: 12.3 g/dL (ref 12.0–15.0)
MCHC: 33 g/dL (ref 30.0–36.0)
MCHC: 33.3 g/dL (ref 30.0–36.0)
MCV: 97.4 fL (ref 78.0–100.0)
MCV: 98.2 fL (ref 78.0–100.0)
MCV: 98.9 fL (ref 78.0–100.0)
Platelets: ADEQUATE 10*3/uL (ref 150–400)
RBC: 3.79 MIL/uL — ABNORMAL LOW (ref 3.87–5.11)
RDW: 13.6 % (ref 11.5–15.5)
RDW: 13.9 % (ref 11.5–15.5)
WBC: 6 10*3/uL (ref 4.0–10.5)

## 2011-01-27 LAB — COMPREHENSIVE METABOLIC PANEL
ALT: 13 U/L (ref 0–35)
ALT: 14 U/L (ref 0–35)
AST: 17 U/L (ref 0–37)
AST: 17 U/L (ref 0–37)
AST: 18 U/L (ref 0–37)
Albumin: 3.3 g/dL — ABNORMAL LOW (ref 3.5–5.2)
Albumin: 3.6 g/dL (ref 3.5–5.2)
Albumin: 3.8 g/dL (ref 3.5–5.2)
Alkaline Phosphatase: 51 U/L (ref 39–117)
Alkaline Phosphatase: 54 U/L (ref 39–117)
Alkaline Phosphatase: 56 U/L (ref 39–117)
BUN: 7 mg/dL (ref 6–23)
Calcium: 7.9 mg/dL — ABNORMAL LOW (ref 8.4–10.5)
Chloride: 109 mEq/L (ref 96–112)
Chloride: 109 mEq/L (ref 96–112)
Chloride: 109 mEq/L (ref 96–112)
Creatinine, Ser: 0.73 mg/dL (ref 0.4–1.2)
Creatinine, Ser: 0.84 mg/dL (ref 0.4–1.2)
GFR calc Af Amer: >60 mL/min (ref >60)
GFR calc Af Amer: >60 mL/min (ref >60)
Glucose, Bld: 105 mg/dL — ABNORMAL HIGH (ref 70–99)
Potassium: 3.9 mEq/L (ref 3.5–5.1)
Potassium: 4.3 mEq/L (ref 3.5–5.1)
Potassium: 5.3 mEq/L — ABNORMAL HIGH (ref 3.5–5.1)
Sodium: 135 mEq/L (ref 135–145)
Sodium: 138 mEq/L (ref 135–145)
Sodium: 140 mEq/L (ref 135–145)
Total Bilirubin: 0.6 mg/dL (ref 0.3–1.2)
Total Bilirubin: 0.8 mg/dL (ref 0.3–1.2)
Total Bilirubin: 1 mg/dL (ref 0.3–1.2)
Total Protein: 6.7 g/dL (ref 6.0–8.3)
Total Protein: 6.7 g/dL (ref 6.0–8.3)
Total Protein: 6.8 g/dL (ref 6.0–8.3)

## 2011-01-27 LAB — DIFFERENTIAL
Eosinophils Relative: 1 % (ref 0–5)
Lymphocytes Relative: 35 % (ref 12–46)
Lymphs Abs: 2 10*3/uL (ref 0.7–4.0)
Neutro Abs: 3.2 10*3/uL (ref 1.7–7.7)

## 2011-01-27 LAB — LIPASE, BLOOD: Lipase: 17 U/L (ref 11–59)

## 2011-01-27 LAB — URINALYSIS, ROUTINE W REFLEX MICROSCOPIC
Bilirubin Urine: NEGATIVE
Hgb urine dipstick: NEGATIVE
Ketones, ur: NEGATIVE mg/dL
Specific Gravity, Urine: 1.019 (ref 1.005–1.030)
pH: 6.5 (ref 5.0–8.0)

## 2011-01-27 LAB — URINE DRUGS OF ABUSE SCREEN W ALC, ROUTINE (REF LAB)
Amphetamine Screen, Ur: NEGATIVE
Barbiturate Quant, Ur: NEGATIVE
Benzodiazepines.: NEGATIVE
Marijuana Metabolite: NEGATIVE
Methadone: NEGATIVE
Propoxyphene: NEGATIVE

## 2011-01-28 LAB — CULTURE, BLOOD (ROUTINE X 2)

## 2011-01-28 LAB — CBC
Hemoglobin: 12.2 g/dL (ref 12.0–15.0)
Hemoglobin: 13 g/dL (ref 12.0–15.0)
MCHC: 33.6 g/dL (ref 30.0–36.0)
Platelets: 273 10*3/uL (ref 150–400)
RBC: 3.69 MIL/uL — ABNORMAL LOW (ref 3.87–5.11)
RDW: 14 % (ref 11.5–15.5)
WBC: 5.8 10*3/uL (ref 4.0–10.5)

## 2011-01-28 LAB — URINE CULTURE: Colony Count: >=100000

## 2011-01-28 LAB — POCT I-STAT, CHEM 8
Calcium, Ion: 1.05 mmol/L — ABNORMAL LOW (ref 1.12–1.32)
Chloride: 107 mEq/L (ref 96–112)
HCT: 43 % (ref 36.0–46.0)
Sodium: 140 mEq/L (ref 135–145)
TCO2: 24 mmol/L (ref 0–100)

## 2011-01-28 LAB — DIFFERENTIAL
Basophils Absolute: 0 10*3/uL (ref 0.0–0.1)
Basophils Relative: 1 % (ref 0–1)
Lymphocytes Relative: 20 % (ref 12–46)
Monocytes Absolute: 0.4 10*3/uL (ref 0.1–1.0)
Neutro Abs: 5.3 10*3/uL (ref 1.7–7.7)

## 2011-01-28 LAB — BASIC METABOLIC PANEL
Calcium: 8.7 mg/dL (ref 8.4–10.5)
Chloride: 108 mEq/L (ref 96–112)
Creatinine, Ser: 0.82 mg/dL (ref 0.4–1.2)
GFR calc Af Amer: >60 mL/min (ref >60)
Sodium: 139 mEq/L (ref 135–145)

## 2011-01-28 LAB — URINALYSIS, ROUTINE W REFLEX MICROSCOPIC
Glucose, UA: NEGATIVE mg/dL
Ketones, ur: 15 mg/dL — AB
Nitrite: POSITIVE — AB
Protein, ur: 100 mg/dL — AB
Urobilinogen, UA: 1 mg/dL (ref 0.0–1.0)

## 2011-01-28 LAB — POCT PREGNANCY, URINE: Preg Test, Ur: NEGATIVE

## 2011-01-28 LAB — URINE MICROSCOPIC-ADD ON

## 2011-02-03 LAB — URINE CULTURE

## 2011-02-03 LAB — URINE MICROSCOPIC-ADD ON

## 2011-02-03 LAB — URINALYSIS, ROUTINE W REFLEX MICROSCOPIC
Bilirubin Urine: NEGATIVE
Glucose, UA: NEGATIVE mg/dL
Ketones, ur: NEGATIVE mg/dL
pH: 7 (ref 5.0–8.0)

## 2011-02-03 LAB — POCT PREGNANCY, URINE: Preg Test, Ur: NEGATIVE

## 2011-02-23 ENCOUNTER — Emergency Department (HOSPITAL_BASED_OUTPATIENT_CLINIC_OR_DEPARTMENT_OTHER)
Admission: EM | Admit: 2011-02-23 | Discharge: 2011-02-23 | Disposition: A | Discharge status: Home / Self Care | Payer: Self-pay | Attending: Emergency Medicine | Admitting: Emergency Medicine

## 2011-02-23 DIAGNOSIS — R112 Nausea with vomiting, unspecified: Secondary | ICD-10-CM | POA: Insufficient documentation

## 2011-02-23 DIAGNOSIS — I10 Essential (primary) hypertension: Secondary | ICD-10-CM | POA: Insufficient documentation

## 2011-02-23 DIAGNOSIS — N390 Urinary tract infection, site not specified: Secondary | ICD-10-CM | POA: Insufficient documentation

## 2011-02-23 LAB — URINALYSIS, ROUTINE W REFLEX MICROSCOPIC
Bilirubin Urine: NEGATIVE
Nitrite: POSITIVE — AB
Specific Gravity, Urine: 1.02 (ref 1.005–1.030)
pH: 6 (ref 5.0–8.0)

## 2011-02-23 LAB — BASIC METABOLIC PANEL
CO2: 23 mEq/L (ref 19–32)
Calcium: 9.9 mg/dL (ref 8.4–10.5)
Chloride: 102 mEq/L (ref 96–112)
Creatinine, Ser: 0.8 mg/dL (ref 0.4–1.2)
GFR calc Af Amer: >60 mL/min (ref >60)
Sodium: 136 mEq/L (ref 135–145)

## 2011-02-23 LAB — PREGNANCY, URINE: Preg Test, Ur: NEGATIVE

## 2011-02-23 LAB — URINE MICROSCOPIC-ADD ON

## 2011-02-25 LAB — URINE CULTURE: Culture  Setup Time: 201205080705

## 2011-03-03 NOTE — H&P (Signed)
[name], LINAM              ACCOUNT NO.:  [id number]   MEDICAL RECORD NO.:  [id number]          PATIENT TYPE:  INP   LOCATION:  0107                         FACILITY:  [facility]   PHYSICIAN:  Altha Harm, MDDATE OF BIRTH:  [birth year]   DATE OF ADMISSION:  10/27/2007  DATE OF DISCHARGE:                              HISTORY & PHYSICAL   CHIEF COMPLAINT:  Fevers, chills, right-sided flank pain x4 days.   HISTORY OF PRESENT ILLNESS:  This is a 35 year old African American  female who has a history of recurrent kidney stones who presents to the  emergency room with complaints of intermittent fever and chills, right  flank pain x4 days.  The patient was seen in the emergency room last  night and sent home with p.o. ciprofloxacin.  However, the patient  states that since returning home from the emergency room she has had  intractable vomiting and has been unable to tolerate anything by mouth.  The patient has had an extensive history of kidney stones.  The patient  had a cystoscopy done in August and a lithotripsy done in September.  She states that she has been unable to pass stones in the past and has  had to have either cystogram or lithotripsy for removal of stones.   PAST MEDICAL HISTORY:  Kidney stones.   FAMILY HISTORY:  Noncontributory.   SOCIAL HISTORY:  The patient smokes a half pack a day x9 years.  No  alcohol or drug use.  The patient is employed as a Chief Strategy Officer.   ALLERGIES:  1. FLAGYL.  2. PERCOCET.  3. TORADOL.   CURRENT MEDICATIONS:  Ciprofloxacin, acetaminophen, and Phenergan which  was received in the emergency room last night.   REVIEW OF SYSTEMS:  Twelve systems are reviewed, all systems are  negative except as noted in the HPI.   STUDIES:  In the emergency room, a hemogram shows a white blood cell  count of 5.6, hemoglobin 13, hematocrit 37.7, platelets of 343.  Sodium  140, potassium 4, chloride 107, bicarb 26, BUN 12, creatinine 1.09.   PHYSICAL EXAMINATION:  GENERAL:  The patient is an obese female sitting  in the bed in no acute distress.  HEENT:  The patient is normocephalic atraumatic.  Pupils are equally  round, reactive to light and accommodation.  Extraocular movements are  intact.  Tympanic membranes translucent bilaterally with good landmarks.  Oropharynx is moist.  No exudate, erythema, or lesions are noted.  NECK:  Trachea is midline.  No masses.  No thyromegaly.  No JVD.  No  carotid bruits.  RESPIRATORY:  The patient has a normal respiratory effort, equal  excursion bilaterally.  She is clear to auscultation.  CARDIOVASCULAR:  She has a normal S1 and S2.  No murmurs, rubs, or  gallops are noted.  PMI is not displaced.  No heaves or thrills on  palpation.  ABDOMEN:  The abdomen is obese, soft, nontender, nondistended.  No  masses, no hepatosplenomegaly.  The patient does have left CVA  tenderness noted.  LYMPH NODE SURVEY:  She has got no cervical, axillary, or inguinal  lymphadenopathy noted.  MUSCULOSKELETAL:  She has got no warmth, swelling, or erythema around  the joints.  NEUROLOGIC:  Cranial nerves II-XII are grossly intact.  No focal  neurologic deficits.  DTRs 2 plus bilaterally upper and lower  extremities.  Sensation intact to light touch, pinprick, and  proprioception.  PSYCHIATRIC:  The patient is alert and oriented x3.  She has got good  insight and cognition.  Good recent and remote recall.   ASSESSMENT:  This is a patient who presents with acute pyelonephritis  and intractable vomiting.  This patient is at risk for having kidney  stones.   PLAN:  Put the patient on IV Cipro, give aggressive hydration.  We will  check a renal ultrasound on the patient.  If indeed she does have any  evidence of kidney stones, urology will be consulted.  In the meantime,  we will continue Cipro until the cultures are received for sensitivity  and then taper the antibiotics per the culture.       Altha Harm, MD  Electronically Signed     MAM/MEDQ  D:  10/27/2007  T:  10/27/2007  Job:  [id number]

## 2011-03-03 NOTE — H&P (Signed)
[name], [name] NO.:  [id number]   MEDICAL RECORD NO.:  [id number]          PATIENT TYPE:  EMS   LOCATION:  ED                           FACILITY:  [facility]   PHYSICIAN:  Vania Rea, M.D. DATE OF BIRTH:  1976/01/19   DATE OF ADMISSION:  06/06/2009  DATE OF DISCHARGE:                              HISTORY & PHYSICAL   PRIMARY CARE PHYSICIAN:  Unassigned.   CHIEF COMPLAINT:  Right flank pain, nausea and vomiting x2 days.   HISTORY OF PRESENT ILLNESS:  This is a 35 year old obese African  American lady with recurrent admissions for pyelonephritis and  nephrolithiasis.  She was discharged on June 30 of this year for the  exact same complaint, and at that time she was found to have an  enterococcal pyelonephritis and nonobstructing nephrolithiasis.  Was  discharged home in satisfactory condition and now returns with a repeat  episode of the same.  The patient says she has no nausea medication at  home.  The only medication she has is her antihypertensive medications.  She feels she has been running fevers, and she has noted no blood in her  urine.   PAST MEDICAL HISTORY:  Kidney stones and nephrolithiasis as noted above.  Also history of hypertension, obesity and tobacco abuse.  She has a  history of lithotripsy.  Bilateral tubal ligation.   MEDICATIONS:  Currently Norvasc 5 mg daily.   ALLERGIES:  FLAGYL, PERCOCET, TORADOL.   SOCIAL HISTORY:  Says she is down to smoking a quarter-pack per day,  previously one-pack per day.  Denies alcohol or illicit drug use.   FAMILY HISTORY:  Significant only for diabetes.   REVIEW OF SYSTEMS:  Other than noted above, unremarkable.   PHYSICAL EXAMINATION:  An obese young African American lady lying in the  stretcher, seems distressed by pain but drowsy from the pain medication.  VITALS:  Her temperature is 99.2, pulse 69, respirations 20, blood  pressure 145/104.  She is saturating at 99% on room air.  Pupils  are  round and equal.  Mucous membranes pink and anicteric.  She is mildly  dehydrated.  No cervical lymphadenopathy or thyromegaly.  No jugular venous tension.  No carotid bruit.  She does have a thick neck.  CHEST:  Clear to auscultation bilaterally.  CARDIOVASCULAR:  Regular rhythm.  No murmur.  ABDOMEN:  Obese, soft.  There is no anterior tenderness.  She does have  right costovertebral angle tenderness.  Normal abdominal bowel sounds.  EXTREMITIES:  Without edema.  She has 2+ dorsalis pedis pulses  bilaterally.  CENTRAL NERVOUS SYSTEM: Cranial nerves II-XII are grossly intact.  She  has no focal neurologic deficit.   LABS:  Significant for the absence of a CBC or a BMP; however, her  lipase is done and it is normal.  Urinalysis done.  She has turbid urine  with a specific gravity of 1.021, pH of 7, nitrite positive, and a large  amount of leukocyte esterase.  Urine pregnancy test is negative.  Urine  microscopy shows white cells too numerous to count, red cells 7 to 10,  and many bacteria.   IMAGING:  Plain x-ray of the abdomen shows no acute findings.   Renal ultrasound shows right and left nephrolithiasis without evidence  of obstruction.  There is an echogenic focus along the anterior margin  of the right kidney, which may represent prior trauma or infection.   ASSESSMENT:  1. Recurrent pyelonephritis.  2. Kidney stones.  3. Hypertension, uncontrolled.  4. Obesity.  5. Ongoing tobacco abuse.   PLAN:  Will admit this lady for IV fluids, intravenous antibiotics, and  pain control.  We will culture her urine and modify our treatment,  depending on the results of cultures.  For the time being, we will  started with the Zosyn.  Will also get a CBC and a BMP to be sure this  lady is not in renal failure and to see the extent of her inflammatory  response.  Other plans as per orders.      Vania Rea, M.D.  Electronically Signed     LC/MEDQ  D:  06/07/2009  T:   06/07/2009  Job:  [id number]

## 2011-03-03 NOTE — Discharge Summary (Signed)
NAMECANTRELL, MARTUS              ACCOUNT NO.:  [id number]   MEDICAL RECORD NO.:  [id number]          PATIENT TYPE:  INP   LOCATION:  1528                         FACILITY:  [facility]   PHYSICIAN:  Peggye Pitt, M.D. DATE OF BIRTH:  10/16/1976   DATE OF ADMISSION:  06/06/2009  DATE OF DISCHARGE:  06/09/2009                               DISCHARGE SUMMARY   DISCHARGE DIAGNOSES:  1. Pyelonephritis secondary to pansensitive Escherichia coli.  2. History of recurrent urinary tract infections.  3. Nephrolithiasis.  4. Hypertension.  5. Morbid obesity.  6. Tobacco abuse.  7. History of lithotripsy.  8. History of bilateral tubal ligation.   DISCHARGE MEDICATIONS:  1. Norvasc 5 mg daily.  2. Cipro 500 mg twice daily for 14 days.  3. Zofran 4 mg sublingual every 6 hours as needed for nausea.   DISPOSITION AND FOLLOWUP:  [name] will be discharged home today in  stable condition.  She is instructed to follow up within about 2-3 weeks  with her outpatient urologist at [facility].   CONSULTATIONS THIS HOSPITALIZATION:  None.   IMAGES AND PROCEDURES PERFORMED DURING THIS HOSPITALIZATION:  1. Include a renal ultrasound on August 19 that showed right      nephrolithiasis without evidence of obstruction.  2. An acute abdominal series on August 19 that showed no acute      abdominal findings or evidence.   HISTORY AND PHYSICAL EXAM:  For full details, please see dictation on  August 20 by Dr. Orvan Falconer, but in brief, [name] is a very pleasant  35 year old, obese, African American woman with a history of recurrent  nephrolithiasis and pyelonephritis, last in the hospital and discharged  on June 30 with enterococcal pyelonephritis and nonobstructing  nephrolithiasis.  She returned because she feels feverish and, indeed,  she is found to have a urinary tract infection which is why we are  called to admit her for further evaluation and management.   HOSPITAL COURSE BY ACTIVE  PROBLEM:  1. Pyelonephritis.  I initially placed on IV Zosyn.  Urine cultures      have come back with pansensitive E. coli.  We will discharge her on      Cipro to complete a total of 14 days of treatment.  I have also      given her some Zofran to help with nausea.  2. Hypertension has been well controlled.  She has been continued on      her Norvasc.  3. Rest of chronic medical issues have not been a problem this      hospitalization.   VITAL SIGNS ON DAY OF DISCHARGE:  Blood pressure 120/79, heart rate 60,  respirations 18, O2 sats 98% on room air with a temperature of 98.0.      Peggye Pitt, M.D.  Electronically Signed     EH/MEDQ  D:  06/09/2009  T:  06/10/2009  Job:  [id number]

## 2011-03-03 NOTE — Discharge Summary (Signed)
[name], [name]              ACCOUNT NO.:  [id number]   MEDICAL RECORD NO.:  [id number]          PATIENT TYPE:  INP   LOCATION:  1316                         FACILITY:  [facility]   PHYSICIAN:  Hillery Aldo, M.D.   DATE OF BIRTH:  07-07-1976   DATE OF ADMISSION:  04/13/2009  DATE OF DISCHARGE:  04/17/2009                               DISCHARGE SUMMARY   PRIMARY CARE PHYSICIAN:  None.   DISCHARGE DIAGNOSES:  1. Enterococcal pyelonephritis.  2. Nonobstructive nephrolithiasis.  3. Hypertension.  4. Nausea and vomiting.  5. Obesity.  6. Tobacco abuse.   DISCHARGE MEDICATIONS:  1. Ampicillin 250 mg p.o. q.6 h. x10 days.  2. Vicodin 5/500 one-two tablets q.6 h. p.r.n. pain.  3. Phenergan 25 mg p.o. q.6 h. p.r.n. nausea.  4. Norvasc 5 mg p.o. daily.   CONSULTATIONS:  None.   BRIEF ADMISSION HISTORY OF PRESENT ILLNESS:  The patient is a 35-year-  old female with past medical history of recurrent urinary tract  infection and nonobstructive nephrolithiasis who presented to the  hospital with a chief complaint of 2 day history of right-sided flank  pain accompanied by intractable nausea and vomiting.  Upon initial  evaluation in the emergency department, the patient was noted to have  evidence of an active urinary tract infection and subsequently was  admitted to the hospital for further evaluation and treatment.  For the  full details, please see the dictated report done by Dr. Kipton Callas.   PROCEDURES/DIAGNOSTIC STUDIES:  CT scan of the abdomen and pelvis on  April 13, 2009 showed stable right renal atrophy, scarring,  calcifications and calculi.  Mild bibasilar atelectasis.  No acute  findings in the pelvis.   LABORATORY DATA:  Discharge laboratory values:  Sodium was 139,  potassium 3.8, chloride 109, bicarb 24, BUN 8, creatinine 0.9, glucose  75, calcium 8.7.  White blood cell count was 5.1, hemoglobin 12,  hematocrit 35.5, platelets 259.  Urine cultures were positive for  enterococcus sensitive to ampicillin, levofloxacin, nitrofurantoin, and  vancomycin.   HOSPITAL COURSE:  1. Enterococcal pyelonephritis:  The patient's symptoms were certainly      consistent with a diagnosis of pyelonephritis given her right-sided      flank pain and systemic symptoms such as nausea and vomiting.  The      patient was initially put on IV Levaquin and this was changed to      Unasyn on the next hospital day.  She remained on Unasyn until      culture and sensitivity data was back.  Given the fact that she is      currently uninsured, our plan is to discharge her on an additional      10 days of treatment with ampicillin.  2. Nonobstructive nephrolithiasis:  The patient is currently      attempting to qualify for Medicaid so that she can see an      urologist.  Nevertheless, there is no evidence of an obstructive      process and we would recommend follow up with the urologist at her  convenience.  3. Hypertension:  The patient was persistently hypertensive and      initially it was felt that this was due to her acute illness and      pain.  Despite treatment with pain medications, her blood pressures      remained elevated and she subsequently was started on Norvasc with      good blood pressure control.  4. Nausea and vomiting:  The patient continues to have nausea, but the      vomiting has resolved.  Her diet has been advanced.  Her p.o.      intake remains poor and we continue to use antiemetics.  If she is      able to tolerate meals for breakfast and lunch, she can be      discharged home later today.  5. Obesity:  The patient was counseled on weight loss.  6. Tobacco abuse:  The patient admits to smoking approximately 5      cigarettes per day.  She was counseled and put on a nicotine patch      at 7 mg transdermally.   DISPOSITION:  The patient is approaching medical stability and likely  will be discharged home this afternoon after she has breakfast and  lunch  to ensure that she does not have any further evidence of intractable  vomiting.   Time spent coordinating care for discharge and discharge instructions  equal to 35 minutes.      Hillery Aldo, M.D.  Electronically Signed     CR/MEDQ  D:  04/17/2009  T:  04/17/2009  Job:  [id number]

## 2011-03-03 NOTE — H&P (Signed)
[name], [name]              ACCOUNT NO.:  [id number]   MEDICAL RECORD NO.:  [id number]          PATIENT TYPE:  INP   LOCATION:  0114                         FACILITY:  [facility]   PHYSICIAN:  Pedro Earls, MD     DATE OF BIRTH:  07/04/1976   DATE OF ADMISSION:  02/11/2009  DATE OF DISCHARGE:                              HISTORY & PHYSICAL   CHIEF COMPLAINT:  Right-sided flank pain, nausea and vomiting.   HISTORY OF PRESENT ILLNESS:  This is a 32-year African American female  patient with a past medical history significant for pyelonephritis in  the past and nephrolithiasis who had a right ureteral stent placed in  March 2010 which was removed.  Was presented with a chief complaint of  right flank pain and nausea and vomiting.  According to the patient, she  was apparently asymptomatic approximately 4 days ago when she started  having some degree of pain in her right flank.  The pain was minor,  which was treated by Tylenol which improved until yesterday when the  patient became 10/10 on a scale of 1-10 when 10 is the worst pain.  Patient took some Tylenol, did not improve and today the patient has  come to the ER for further evaluation and management.  The patient's  pain was associated with nausea and some vomiting which started day  before yesterday which led to dehydration, as well.   REVIEW OF SYSTEMS:  As above.  Rest of the review of systems was  negative.   PAST MEDICAL HISTORY:  1. Recurrent right pyelo, status post nephrostomy and right ureteral      stent in January 2010.  2. History of nephrolithiasis.  3. History of urinary tract infection.   SOCIAL HISTORY:  Smokes cigarettes.  No alcohol.  No IV drug abuse.   FAMILY HISTORY:  Positive for diabetes.   PAST SURGICAL HISTORY:  1. Bilateral tubal ligation.  2. Nephrostomy and ureteral stent placement.   ALLERGIES:  FLAGYL, PERCOCET and TORADOL.   PHYSICAL EXAM:  VITALS:  Temperature 97.7, blood pressure  147-153/90s-  100, pulse 94, respiration 18, pulse oximetry  98-99%.  Patient awake, alert, oriented x3.  Does not appear to be deep distress.  HEENT:  Pupils equal, round, reactive to light.  No icterus.  No pallor.  Extraocular movements are intact.  Oral mucosa is dry.  NECK:  Supple.  No JVD.  No lymphadenopathy.  CARDIOVASCULAR:  S1, S2.  Regular.  No murmurs, heaves or gallops.  CHEST:  Clear.  ABDOMEN:  Soft.  Area of tenderness on a deep palpation of the right  flank area.  No rebound.  Bowel sounds present.  No hepatosplenomegaly.  EXTREMITIES:  Peripheral pulses present.  No clubbing, cyanosis, edema.  CNS:  Sensory and motor grossly intact.  Cranial nerves II through XII  are intact.  SKIN:  No rashes.  MUSCULOSKELETAL:  Unremarkable.   CT scan showed no stool in the pelvis.  The right ureteral stent has  been removed.  UA showed too numerous to count WBCs, positive nitrites  and leukocyte esterase.  Urine pregnancy test was negative.   IMPRESSION:  1. Acute pyelonephritis.  2. Dehydration.  3. Nausea and vomiting.   PLAN:  Admit to medical/surgical.  Start IV fluids with normal saline,  IV Rocephin, IV Dilaudid.      Pedro Earls, MD  Electronically Signed     NS/MEDQ  D:  02/11/2009  T:  02/11/2009  Job:  [id number]

## 2011-03-03 NOTE — Discharge Summary (Signed)
[name], [name]              ACCOUNT NO.:  [id number]   MEDICAL RECORD NO.:  [id number]          PATIENT TYPE:  INP   LOCATION:  1516                         FACILITY:  [facility]   PHYSICIAN:  Eduard Clos, MDDATE OF BIRTH:  1976/09/28   DATE OF ADMISSION:  02/11/2009  DATE OF DISCHARGE:                               DISCHARGE SUMMARY   COURSE IN THE HOSPITAL:  A 35 year old female with a history of  recurrent pyelonephritis presented with right flank pain.  On admission,  patient had a CAT scan of the abdomen and pelvis which did show any  acute features except for mild right perinephric stranding with no  hydronephrosis or any abscess-like features.  UA was showing features of  UTI.  Patient was admitted to a medical floor, started on empiric  antibiotics.  Urine cultures grew Staphylococcus aureus, MSSA sensitive  to Bactrim.   I did discuss with Dr. Maurice March of infectious disease, who advised to get  blood cultures.  At this time, as the patient is largely asymptomatic,  will discharge home with p.o. antibiotic for 10 days and follow the  blood cultures.  If there is any growth, we will call the patient back.   Patient has no new murmurs, is not febrile, and is not showing any  leukocytosis.  Patient is eager to go home, tolerating diet.   At the time of this dictation, patient is hemodynamically stable.   PROCEDURES DONE DURING THIS STAY:  CT abdomen and pelvis without  contrast on February 11, 2009 showed previously placed right ureteral stent  has been removed from the right kidney, and there are multiple  calcifications, but there does not appear to be detectable  hydroureteronephrosis.  There is some stranding around the kidney,  raising the possibility of infection or recent obstruction.   CT of the pelvis shows no sign of stone or disease in the pelvis.   PERTINENT LABS:  Urine culture grew MSSA sensitive to Bactrim,  doxycycline, and patient's WBC has been 5.8,  with creatinine of 0.8.  Pregnancy screen was negative.   FINAL DIAGNOSES:  1. Pyelonephritis with methicillin-susceptible staphylococcus aureus.  2. Dehydration.  3. Nausea and vomiting.   MEDICATIONS ON DISCHARGE:  1. Bactrim DS 1 tablet p.o. b.i.d. x10 days.  2. Tramadol 50 mg p.o. t.i.d. p.r.n. pain.   PLAN:  Patient is advised to follow up with her primary care physician  within a week's time.  We will follow her blood cultures.  If there is  anything growing, patient will be called back.  Patient is stable,  otherwise has no fever, tachycardia, or any WBC count.  So we will  discharge home on Bactrim.      Eduard Clos, MD  Electronically Signed     ANK/MEDQ  D:  02/14/2009  T:  02/14/2009  Job:  [id number]

## 2011-03-03 NOTE — H&P (Signed)
[name], [name]              ACCOUNT NO.:  [id number]   MEDICAL RECORD NO.:  [id number]          PATIENT TYPE:  INP   LOCATION:  1316                         [facility]ACILITY:  [facility]   PHYSICIAN:  Pedro Earls, MD     DATE OF BIRTH:  11/29/1975   DATE OF ADMISSION:  04/13/2009  DATE OF DISCHARGE:                              HISTORY & PHYSICAL   CHIEF COMPLAINT:  Right flank pain and vomiting.   HISTORY OF PRESENT ILLNESS:  This is a 35 year old African American  female patient with a past medical history of multiple UTIs and kidney  stones who presented with a 2-day history of right-sided flank pain with  nausea and intractable vomiting.  Patient had recently had a CT scan  done which revealed nephrolithiasis.  It was nonobstructing, and patient  was trying to get Medicaid so that she can go and see a urologist and  get the lithotripsy done.   Patient did not have any dysuria or any fevers at home.  She had  multiple episodes of vomiting which started 2 days ago approximately  every 30 minutes to every hour or whenever she tried to eat something or  drink something.   REVIEW OF SYSTEMS:  As above.  Rest of the review of systems is  negative.   PAST MEDICAL HISTORY:  1. Nephrolithiasis.  2. Pyelonephritis.  3. UTI.   FAMILY HISTORY:  Diabetes.   PAST SURGICAL HISTORY:  Lithotripsy, tubal ligation, kidney stone  removal.   SOCIAL HISTORY:  Patient smokes 1 cigarette per day, no alcohol or IV  drug abuse.   ALLERGIES:  PERCOCET, TORADOL, FLAGYL, TYLENOL AND ZOFRAN.   PHYSICAL EXAMINATION:  VITAL SIGNS:  Temperature is 98.4, respirations  20, pulse 80s-90s, blood pressure 152/114, pulse oximetry of 99%.  GENERAL:  Patient is awake, alert and oriented x3.  She had been having  some nausea and vomited 2 times while I was examining the patient.  HEENT:  Pupils are equal, round and reactive to light.  No icterus.  No  pallor.  Extraocular movements are intact.  Mucosa is  dry.  NECK:  Supple, no JVD, no lymphadenopathy.  CVS:  S1, S2, regular, no murmurs, heaves or gallops.  CHEST:  Clear.  ABDOMEN:  Soft, nontender, bowel sounds are present.  No  hepatosplenomegaly.  EXTREMITIES:  Peripheral pulses are present.  No clubbing, cyanosis or  edema.  CENTRAL NERVOUS SYSTEM:  Sensorimotor grossly intact.  Cranial nerves II-  XII are intact.  SKIN:  No rashes.  MUSCULOSKELETAL:  Unremarkable.   LABORATORIES:  Urine WBCs are 11-20, bacteria few, leukocyte esterase  small, nitrite negative.  Potassium 4, BUN 8, creatinine 0.86.  White  count is 6.2.   CT scan has not been done in the ER.   IMPRESSION:  1. Urinary tract infection.  2. Nausea and vomiting.  3. Dehydration.  4. History of nephrolithiasis.   PLAN:  Start IV Levaquin.  Will continue IV fluids.  Start Zofran.  Use  p.r.n. Reglan.  Check CT scan of the abdomen.  Renal stone protocol.  Will check labs  in a.m.      Pedro Earls, MD  Electronically Signed     NS/MEDQ  D:  04/13/2009  T:  04/13/2009  Job:  [id number]

## 2011-03-03 NOTE — Discharge Summary (Signed)
[name], [name]              ACCOUNT NO.:  [id number]   MEDICAL RECORD NO.:  [id number]          PATIENT TYPE:  INP   LOCATION:  1321                         FACILITY:  [facility]   PHYSICIAN:  Altha Harm, MDDATE OF BIRTH:  Feb 10, 1976   DATE OF ADMISSION:  10/27/2007  DATE OF DISCHARGE:  10/30/2007                               DISCHARGE SUMMARY   DISCHARGE DISPOSITION:  Home.   FINAL DISCHARGE DIAGNOSES:  1. Escherichia coli pyelonephritis.  2. Intractable vomiting, resolved.  3. History of nephrolithiasis with atrophic kidneys.   DISCHARGE MEDICATIONS:  1. Ciprofloxacin 500 mg p.o. b.i.d. x6 days.  2. Vicodin 5/500 one tab p.o. q.4 h. p.r.n. pain.   CONSULTATIONS:  Telephone consultation with Dr. [facility] B[facility] of [facility].   PROCEDURES:  None.   DIAGNOSTIC STUDIES:  Renal ultrasound shows chronic right renal atrophy  and right nephrolithiasis.  No hydronephrosis.   CODE STATUS:  Full code.   ALLERGIES:  1. FLAGYL.  2. PERCOCET.  3. TORADOL.   CHIEF COMPLAINT:  Fever, chills, right sided flank pain x4 days.   HISTORY OF PRESENT ILLNESS:  Please see H&P dictated by Dr. Ashley Royalty on  October 27, 2007.   HOSPITAL COURSE:  Pyelonephritis.  The patient was admitted and started  on IV hydration with IV ciprofloxacin.  The patient had been seen in the  ER the day before and cultures from the urine on the day before revealed  E. coli which was sensitive to ciprofloxacin.  The patient continued to  have intractable emesis and inability to tolerate p.o. which extended  her hospital stay.  However, as the patient's emesis and nausea  resolved, the patient was able to tolerate a diet.  The patient was  placed on diet, tolerated it well without any difficulty.  The patient  is being sent home on p.o. ciprofloxacin to complete a 10-day course.  The patient has been instructed to contact [facility] for an  outpatient appointment and Dr. Logan Bores has suggested that  the patient may  benefit from having her right kidney removed.  In light of the fact that  the patient keeps having recurring urinary tract infections and  recurrent stones that need to be removed.  The patient's condition at  the time of discharge is stable.   DIETARY RESTRICTIONS:  None.   PHYSICAL RESTRICTIONS:  None.      Altha Harm, MD  Electronically Signed     MAM/MEDQ  D:  10/30/2007  T:  10/30/2007  Job:  [id number]

## 2011-03-03 NOTE — H&P (Signed)
[name], [name]              ACCOUNT NO.:  [id number]   MEDICAL RECORD NO.:  [id number]          PATIENT TYPE:  INP   LOCATION:  0101                         FACILITY:  [facility]   PHYSICIAN:  Richarda Overlie, MD       DATE OF BIRTH:  Dec 31, 1975   DATE OF ADMISSION:  03/06/2009  DATE OF DISCHARGE:                              HISTORY & PHYSICAL   PRIMARY CARE PHYSICIAN:  Unassigned.   SUBJECTIVE:  This is a 35 year old female with a history of recurrent  pyelonephritis previous history of nephrolithiasis with a right ureteral  stent placement in March 2010 which was later removed who presents to  the ER with right flank pain.  The patient states that she has been  having right flank pain for the last 3 days with acute onset of nausea,  vomiting this morning.  She also complains of diffuse abdominal pain,  waxing and waning in nature associated with about 5-6 episodes of  vomiting in the ER and dry heaving.  She denies any fever, chills and  rigors, denies any urinary urgency, frequency or dysuria..  She denies  any chest pain, shortness of breath, palpitations, dizziness.  She is  being admitted to the ER because of intractable nausea and pain and her  workup in the ER has been essentially negative.   PAST MEDICAL HISTORY:  1. Recurrent right pyelonephritis status post nephrostomy.  2. Right ureteral stent in January 2010.  3. History of nephrolithiasis.   SOCIAL HISTORY:  Smokes a pack a day.  No alcohol.  No IV drug use.   FAMILY HISTORY:  Positive for diabetes.   SURGICAL HISTORY:  1. Bilateral tubal ligation.  2. Nephrostomy ureteral stent placement.   ALLERGIES:  FLAGYL, PERCOCET AND TORADOL.   CURRENT MEDICATIONS:  None.   REVIEW OF SYSTEMS:  As documented in HPI.   PHYSICAL EXAMINATION:  VITAL SIGNS:  Blood pressure 147/95, pulse of 70,  respirations 20.  GENERAL:  The patient appears to be in mild to moderate distress because  of nausea and dry heaving.  HEENT:   Pupils equal and reactive.  Sclerae anicteric.  No pallor.  Extraocular movements intact.  NECK:  Supple without any JVD.  No lymphadenopathy.  CARDIOVASCULAR:  Regular rate and rhythm.  No appreciable murmurs, rubs  or gallops.  CHEST:  Clear to auscultation bilaterally.  No wheezes or crackles or  rhonchi.  ABDOMEN:  Tenderness to palpation in the right flank the area and the  right upper quadrant and costovertebral angle tenderness noted.  No  hepatosplenomegaly.  Bowel sounds are normoactive.  No  hepatosplenomegaly noted.  NEUROLOGIC:  Cranial nerves II-XII grossly intact.   LABORATORY DATA:  CT scan of the abdomen and pelvis without contrast  shows no definite intrapelvic abnormalities.  She does have a stable  nonobstructive calculi scarring pattern, calcification and cyst in the  right kidney.   Sodium 140, potassium 3.9, chloride 109, glucose 112, BUN 11, creatinine  0.86, calcium 9.5, albumin 3.8, AST 18, ALT 60, T-bili is 0.6.  WBC 5.7,  hemoglobin 12.3, hematocrit 36.3, platelet count  274.  Urinalysis shows  specific gravity 1.019, negative for nitrite, leukocyte esterase.  Pregnancy test is negative.   ASSESSMENT AND PLAN:  1. Intractable nausea, vomiting, unclear etiology, probably viral      gastroenteritis.  2. Dehydration.  3. The patient does not have any history of UTI or pyelonephritis.      Therefore, will not be started on antibiotics.  She will be started      on IV hydration.  Will use p.r.n. Zofran and Phenergan for her      intractable nausea.  Will minimize narcotic medications.  Will      order a right upper quadrant ultrasound to rule out gallstones.      Will obtain a urine drug screen, probably be discharged in the      morning.      Richarda Overlie, MD  Electronically Signed     NA/MEDQ  D:  03/06/2009  T:  03/07/2009  Job:  [id number]

## 2011-03-06 NOTE — Discharge Summary (Signed)
[name], BUFFALO              ACCOUNT NO.:  [id number]   MEDICAL RECORD NO.:  [id number]          PATIENT TYPE:  INP   LOCATION:  5501                         FACILITY:  [facility]   PHYSICIAN:  Deirdre Peer. Polite, M.D. DATE OF BIRTH:  1976-08-21   DATE OF ADMISSION:  04/15/2006  DATE OF DISCHARGE:                                 DISCHARGE SUMMARY   DISCHARGE DIAGNOSES:  1.  Pyelonephritis, improved at time of discharge.  Urine culture positive      for enterococcus, sensitive to ampicillin and levofloxacin.  Being      discharged on 7 more days of levofloxacin.  2.  Nausea and vomiting secondary to #1.   DISCHARGE MEDICATIONS:  1.  Levofloxacin 500 mg daily.  2.  Phenergan 25 mg as needed for nausea.  3.  Tylenol p.r.n. pain.   DISPOSITION:  Discharge to home in stable condition.   CONSULTANTS:  None.   DATA:  Studies show urine culture positive for enterococcus.   CT of the abdomen and pelvis:  Stable appearance of the right kidney.  Findings include cortical thinning/scaring, areas of apparent calcification.  No hydronephrosis or acute process.  Normal appendix.  Pelvis:  No distal  urinary tract calculi,  2.2 cm right adnexal lesion, likely a dominant  follicle.   BMET within normal limits at the time of dictation.   HISTORY OF PRESENT ILLNESS:  This 35 year old female presented to the  hospital with nausea and vomiting.  In the ED was evaluated and found to  have pyelonephritis.  Admission was obtained at this time for further  evaluation and treatment.  Please see dictated H&P for further details.   PAST MEDICAL HISTORY:  Per admission H&P.   MEDICATIONS ON ADMISSION:  Cipro, Vicodin and Reglan.   SOCIAL HISTORY:  Positive for tobacco.   HOSPITAL COURSE:  Patient was admitted to the medicine floor for evaluation  and treatment of pyelonephritis.  Patient was treated conservatively with IV  fluids, analgesic and antiemetics.  Patient had CT results as stated  above.  Urine culture ultimately showed enterococcus.  Patient with prolonged  period, approximately 36-48 hours without eating by her report.  However  nursing states that she has been witnessed walking the halls and eating.  On  the second, patient states that she has consumed some food and is ready to  go home.  The patient is afebrile.  Hemodynamically stable.   DISCHARGE INSTRUCTIONS:  For discharge to home, continue medications as  outlined above in the medication section.  Patient has been advised to avoid  tub baths in order to decrease bacteria in her genital area.  At this time  again, patient is stable for discharge home.      Deirdre Peer. Polite, M.D.  Electronically Signed     RDP/MEDQ  D:  04/19/2006  T:  04/19/2006  Job:  [id number]

## 2011-03-06 NOTE — Op Note (Signed)
[facility]  Patient:    [name], [name]                     MRN: [id number] Proc. Date: 10/30/00 Adm. Date:  [id number] Attending:  Michaelle Copas                           Operative Report  PREOPERATIVE DIAGNOSIS:       Desires sterilization.  POSTOPERATIVE DIAGNOSIS:      Desires sterilization.  PROCEDURE:                    Bilateral partial salpingectomy ([place]                               technique.) SURGEON:                      Charles A. Clearance Coots, M.D.  ANESTHESIA:                   General.  ESTIMATED BLOOD LOSS:         Negligible.  COMPLICATIONS:                None.  SPECIMENS:                    Approximately 2-cm segments of right and left                               fallopian tubes.  DESCRIPTION OF PROCEDURE:     Patient was brought to the operating room. After satisfactory general endotracheal anesthesia, the abdomen was prepped and draped in the usual sterile fashion. A small inferior umbilical incision was made with the scalpel that was deepened down to the fascia with a pair of Mayo scissors. Fascia was grasped in the midline with Kelly forceps and was cut transversely with curved Mayo scissors. The fascial incision was extended to the left and to the right with the curved Mayo scissors. Peritoneum was entered while make the fascial incision and this was opened with curved Mayo scissors and right angle retractors were placed in the incision. The left fallopian tube was identified and was grasped with the Babcock clamp. The tube was then followed from the cornual end to the fimbrial end inferiorly and was grasped with Babcock clamps, then the tube was followed retrograde back to the isthmic area of the tube. The Babcock clamps and knuckle tube beneath the Babcock clamp and the isthmic area of tube was doubly ligated with #1 plain catgut and a section of tube above the knot was excised with Metzenbaum scissors  and submitted to pathology for evaluation. The same procedure was performed on the opposite side without complications.  The abdomen was then closed as follows: the peritoneum and fascia was closed as one with a continuous suture of 2-0 Vicryl. The subcutaneous tissue was approximated with interrupted sutures of 2-0 Vicryl. The skin was closed with a continuous subcuticular suture of 4-0 Monocryl. A sterile bandage was applied to the incision closure. The patient tolerated the procedure well and was transported to the recovery room in satisfactory condition. DD:  10/30/00 TD:  10/31/00 Job: [id number]

## 2011-03-06 NOTE — Discharge Summary (Signed)
[name], [name]              ACCOUNT NO.:  [id number]   MEDICAL RECORD NO.:  [id number]          PATIENT TYPE:  INP   LOCATION:  1319                         FACILITY:  [facility]   PHYSICIAN:  Hillery Aldo, M.D.   DATE OF BIRTH:  12-12-1975   DATE OF ADMISSION:  03/06/2009  DATE OF DISCHARGE:  03/08/2009                               DISCHARGE SUMMARY   PRIMARY CARE PHYSICIAN:  Unassigned.   NEUROLOGIST:  Dr. Teofilo Pod at [facility]   DISCHARGE DIAGNOSES:  1. Intractable nausea and vomiting.  2. Right-sided flank pain with history of right nephrolithiasis, no      evidence of obstructive uropathy.  3. Tobacco abuse.  4. Obesity.  5. Mild normocytic anemia.   DISCHARGE MEDICATIONS:  1. Vicodin 5/500 one tablet q.4 h. p.r.n. pain.  2. Phenergan 25 mg p.o. q.6 h. p.r.n. nausea.   CONSULTATIONS:  None.   BRIEF ADMISSION HISTORY OF PRESENT ILLNESS:  The patient is a 35-year-  old female who has a longstanding history of recurrent pyelonephritis  from nephrolithiasis who has actually had to have a right ureteral stent  placed in the past who presented to the hospital with nausea, vomiting,  and right-sided flank pain.  The patient was admitted for further  evaluation and workup.  For the full details, please see the dictated  report done by Dr. Susie Cassette.   PROCEDURES AND DIAGNOSTIC STUDIES:  1. CT scan of the abdomen and pelvis on Mar 06, 2009, showed stable      nonobstructive calculi, scarring, parenchymal calcification and      cyst in the right kidney.  No acute upper abdominal abnormalities.      No definite intrapelvic abnormalities.  2. Abdominal ultrasound on Mar 07, 2009, showed no evidence for      gallbladder disease.  Right-sided nephrolithiasis and parenchymal      calcifications.   DISCHARGE LABORATORY VALUES:  White blood cell count was 6, hemoglobin  11.6, hematocrit 34.5; platelets were clumped.  Sodium was 138,  potassium  5.3 (hemolyzed specimen), chloride 109, bicarb 23, BUN 7,  creatinine 0.84, glucose 105, calcium 8.3.  Liver function studies were  within normal limits.  Total protein was 5.7, albumin 3.3.  Urine drug  screen was negative.  Lipase was 17.  Urinalysis was negative for blood,  nitrites, and leukocytes.  Urine pregnancy testing was negative.   HOSPITAL COURSE BY PROBLEM:  1. Intractable nausea and vomiting:  The patient was put on IV fluids      and antiemetics.  She was put on bowel rest and her diet slowly      advanced from clears to full to a regular diet as tolerated.  Once      the patient was able to demonstrate that she could tolerate solid      foods, she was discharged home.  2. Right flank pain with history of nephrolithiasis:  Despite an      extensive workup, there was no obstructive uropathy found, no      evidence of active stone obstruction.  She had  no blood on her      urinalysis and no signs of infection.  CT scan and ultrasonography      failed to demonstrate a specific pathology that would explain her      degree of pain.  The patient was treated symptomatically and      improved to the point where she could be discharged after less than      24 hours.  3. Tobacco abuse:  The patient was counseled on importance of      cessation and provided with nicotine patches if needed.  4. Obesity:  The patient was encouraged to lose weight.   DISPOSITION:  The patient was medically stable by the afternoon of Mar 08, 2009, when her diet had been fully advanced.  She was encouraged to  follow up with her urologist, Dr. Teofilo Pod, as needed.   Time spent coordinating care for discharge and discharge instructions  equals 25 minutes.      Hillery Aldo, M.D.  Electronically Signed     CR/MEDQ  D:  03/11/2009  T:  03/11/2009  Job:  [id number]   cc:   Annye Asa, MD

## 2011-03-06 NOTE — Discharge Summary (Signed)
[name], [name]              ACCOUNT NO.:  [id number]   MEDICAL RECORD NO.:  [id number]          PATIENT TYPE:  INP   LOCATION:  5002                         FACILITY:  [facility]   PHYSICIAN:  Kela Millin, M.D.DATE OF BIRTH:  [birth year]   DATE OF ADMISSION:  02/20/2006  DATE OF DISCHARGE:  02/25/2006                                 DISCHARGE SUMMARY   DISCHARGE DIAGNOSES:  1.  Right renal stones/calcifications - status post surgery at [facility] in      September by Dr. Garfield Cornea.  2.  Urinary tract infection.  3.  Volume depletion - resolved.   HISTORY OF PRESENT ILLNESS:  The patient is a 35 year old black female with  a longstanding history of right-sided staghorn calculus, who presented with  nausea, vomiting, and intermittent right flank pain.  She reported that  prior to 2004, she had had several episodes of nephrolithiasis related to  the staghorn calculi and underwent at least 1 percutaneous nephrostomy with  several lithotripsies.  She was admitted in 2004 to the hospital and, at  that time, underwent a cystoscopy with right retrograde __________pyelogram  and a double-J stent placed by Dr. Retta Diones.  Since that time, the patient  has had another lithotripsy in 2005 and September 2006.  An operative  procedure was also done at [facility] and stones removed.  She  reported that she did well until about 8-9 days ago when she had sudden  onset of right flank pain.  She tried to ignore it, but the pain became  quite severe and eventually on May 1, she came to the [facility].  At  that time, she was treated with IV fluids, antiemetics as well as pain  medications, had a CT scan of the abdomen which showed stable right renal  stones with calcifications, no hydronephrosis, and no ureteral calculi.  The  patient was felt not to be obstructed.  She was given antibiotics as well as  Phenergan and Percocet and discharged home.  She developed persistent  nausea, vomiting,  and was unable to take these medications and reported that  she had continued to vomit for the 3 days prior to this admission.  She also  reported that she had intermittent flank pain.  She returned to the ER on  the day of admission and was noted to have a temperature of 98.2 with a  blood pressure of 144/98.  Pulse was initially 132 but at the time she was  seen, had come down to 70.  The patient on May 1 had a urinalysis which  showed 7-10 WBC and many bacteria, and a culture obtained at that time  showed greater than 100,000 colonies of multiple bacterial pathogens which  were not further characterized.  On the day of admission, she had a UA which  showed 21-50 white cells, few bacteria, and the second UA only showed few  bacteria.  The patient was admitted for management of vomiting, volume  depletion, and flank pain possibly secondary to kidney stones and a presumed  urinary tract infection.   Her physical exam upon admission as  per Dr. Tresa Endo revealed a temperature  of 98.2, blood pressure 144/98, pulse of 70, and the pertinent findings on  exam were on her back, there was a healed right flank incision, tenderness  in the right flank area, and the rest of her physical exam was noted to be  within normal limits.   On the laboratory data, the UA results are as stated above, and her white  cell count was 8.9, hemoglobin 13.9, hematocrit 41.3, platelet count 347,  neutrophil count 87%, and her BUN was 14 with a creatinine of 0.9.   ASSESSMENT/PLAN:  1.  Right renal stones - Upon admission, the patient was started on IV      fluids, kept NPO, and started on IV analgesics for pain control.      Urology was consulted, and Dr. Wanda Plump saw the patient.  Following      review of the CT scan, he stated that there were no obstructive stones      on the right and that a scar was noted that was consistent with her      previous surgery.  No further inpatient interventions were  recommended,      and Dr. Wanda Plump recommended for the patient to follow up with Dr. Garfield Cornea      at [facility] upon discharge.  2.  Urinary tract infection - Upon admission, the patient was empirically      started on IV antibiotics, and urine cultures were done.  The urine      cultures again only grew multiple bacterial microphytes was      nonpredominant.  The patient remained afebrile with no leukocytosis      throughout her hospital stay.  Her symptoms improved, and she is      tolerating p.o. at this time.  She will be discharged home at this time      on oral antibiotics.  She is to follow up with her primary care      physician and urologist.  3.  Volume depletion - The patient was hydrated during her hospital stay,      and she is tolerating p.o. well at this time, resolved.   DISCHARGE MEDICATIONS:  1.  Percocet 1-2 tabs p.o. q.6h. p.r.n. number prescribed - 30.  2.  Cipro 500 mg p.o. BID   FOLLOW UP CARE:  1.  Primary care physician as scheduled.  2.  Dr. Garfield Cornea.  The patient to call for an appointment.   DISCHARGE CONDITION:  Improved/stable.      Kela Millin, M.D.  Electronically Signed     ACV/MEDQ  D:  02/25/2006  T:  02/25/2006  Job:  [id number]

## 2011-03-06 NOTE — H&P (Signed)
[name], [name]              ACCOUNT NO.:  [id number]   MEDICAL RECORD NO.:  [id number]          PATIENT TYPE:  INP   LOCATION:  5501                         FACILITY:  [facility]   PHYSICIAN:  Corinna L. Lendell Caprice, MDDATE OF BIRTH:  May 14, 1976   DATE OF ADMISSION:  04/16/2006  DATE OF DISCHARGE:                                HISTORY & PHYSICAL   CHIEF COMPLAINT:  Vomiting.   HISTORY OF PRESENT ILLNESS:  [name] is a 35 year old black female with a  history of recurrent nephrolithiasis and recurrent urinary tract infections.  She was in the emergency room 2 nights ago and was diagnosed with  pyelonephritis.  She was sent home with ciprofloxacin, Reglan and Vicodin.  She had had vomiting and right-sided flank pain and presents again with  continued vomiting and pain.  She is unable to keep down her medications.   PAST MEDICAL HISTORY:  As above.   MEDICATIONS:  Cipro, Vicodin, Reglan.   SOCIAL HISTORY:  She smokes.  She does not drink or use drugs.  She is  unemployed.   FAMILY HISTORY:  Noncontributory.   REVIEW OF SYSTEMS:  CONSTITUTIONAL:  She has had some chills.  Otherwise,  review of systems, as above, are negative.   PHYSICAL EXAMINATION:  VITAL SIGNS:  Temperature is 98, blood pressure  130/95, pulse 76, respiratory rate 20, oxygen saturation 100% on room air.  GENERAL:  The patient is an obese black female in no acute distress.  HEENT:  Normocephalic, atraumatic.  Pupils equal, round, and reactive to  light.  Moist mucous membranes.  NECK:  Supple.  ABDOMEN:  Soft.  Mild suprapubic tenderness.  GU AND RECTAL:  Deferred.  EXTREMITIES:  No clubbing, cyanosis or edema.  BACK:  She does have right right-sided CVA tenderness.  SKIN:  No rash.  PSYCHIATRIC:  Normal affect.  NEUROLOGIC:  Alert and oriented.  Cranial nerves and sensorimotor exam are  grossly intact.   LABORATORY DATA:  CBC is unremarkable.  Basic metabolic panel unremarkable.  Urine pregnancy from  April 14, 2006 negative.  UA shows specific gravity of  1.028, pH 6.5, small bilirubin, 15 ketones, negative blood, 30 of protein, 2  urobilinogen, negative nitrite, moderate leukocyte esterase, many squamous  epithelial cells, 21-50 white cells, rare bacteria, 0-2 red cells.  On April 14, 2006 she had a few squamous epithelial cells and had negative nitrates  and moderate leukocyte esterase.  Urine culture from the 27th has not grown  anything out.   CT of the kidneys done on April 14, 2006 shows no stone.   ASSESSMENT AND PLAN:  Pyelonephritis with continued vomiting.  The patient  will be admitted for intravenous antibiotics, intravenous fluids antiemetics  and pain medications.      Corinna L. Lendell Caprice, MD  Electronically Signed     CLS/MEDQ  D:  04/16/2006  T:  04/16/2006  Job:  [id number]

## 2011-03-06 NOTE — Op Note (Signed)
   [name], [name]                        ACCOUNT NO.:  [id number]   MEDICAL RECORD NO.:  [id number]                   PATIENT TYPE:  AMB   LOCATION:  [facility]                                 FACILITY:  [facility]   PHYSICIAN:  Bertram Millard. Dahlstedt, M.D.          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   PRINCIPAL PROCEDURE:  Cystoscopy, double J stent exchange.   SURGEON:  Bertram Millard. Dahlstedt, M.D.   ANESTHESIA:  General with LMA.   COMPLICATIONS:  None.   BRIEF HISTORY:  A 35 year old female with a long history of right renal  calculi. She has an untreated, so far, right staghorn calculus. She is  noncompliant with her therapy. I placed a stent approximately 1 month ago.  This has migrated distally into her bladder with significant bladder pain.  She presents at this time for stent exchange. She is scheduled to have  consultation at the [facility] Department of Urology soon for  definitive management of her right staghorn calculus.   DESCRIPTION OF PROCEDURE:  The patient was administered a general anesthetic  after IV antibiotics were administered. She was placed in the dorsal  lithotomy position. Her genitalia and perineum were prepped and draped. A  cystoscope was advanced into her bladder which was normal except for a stent  curled within her bladder which was calcified. The stent was extracted  intact. A guidewire was then placed fluoroscopically up into her right upper  pole. Over top of this, a 6 French x 24 cm double J stent was advanced using  fluoroscopic guidance. After adequate positioning was present, good curls  were seen proximally and distally after the guidewire was removed.   The patient  tolerated the procedure well. Her bladder was drained, the scope  removed, and she was transported to the PACU in stable condition.                                               Bertram Millard. Dahlstedt, M.D.    SMD/MEDQ  D:  01/22/2003  T:  01/22/2003  Job:  [id number]
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
## 2011-03-06 NOTE — H&P (Signed)
[name], [name]              ACCOUNT NO.:  [id number]   MEDICAL RECORD NO.:  [id number]          PATIENT TYPE:  INP   LOCATION:  5002                         FACILITY:  [facility]   PHYSICIAN:  Sherin Quarry, MD      DATE OF BIRTH:  1976/06/03   DATE OF ADMISSION:  02/20/2006  DATE OF DISCHARGE:                                HISTORY & PHYSICAL   HISTORY OF PRESENT ILLNESS:  [name] is a 35 year old lady with a  very longstanding history of a right-sided staghorn calculus.  Prior to  2004, the patient had several episodes of nephrolithiasis related to this  staghorn calculus and underwent at least one percutaneous nephrostomy and  several lithotripsies.  She was admitted in 2004 to the hospital and at that  time underwent a cystoscopy with right retrograde ureteropyelogram and  double-J stent placement by Dr. Retta Diones.  I gather that the double stent  had to be exchanged in April 2004.  Since that time she apparently had  another lithotripsy in 2005 and in September 2006 had some type of operative  procedure done at [facility] which involved a right flank incision.  She said that the stones were removed as result of this procedure.  She then  did well until about 8 or 9 days ago when she had sudden onset of right  flank pain.  She tried to ignore it but the pain became quite severe and  then eventually on Feb 16, 2006 she presented to [facility].  There  she was treated with intravenous fluids, pain and nausea medication, and  underwent a CT scan of the abdomen which showed stable right renal stones  with calcifications, no hydronephrosis and no ureteral calculi.  Thus the  patient was felt not to be obstructed.  She was given antibiotics, possibly  doxycycline, as well as Phenergan 25 mg for nausea and Percocet for pain.  Unfortunately she developed persistent vomiting and was unable to take these  medications.  She has continued to vomit for the last 3 days.   She has had  intermittent pain in her flank area.  Eventually she returned to the  emergency room today and on presentation her temperature is 98.2, blood  pressure 144/98, her pulse was initially 132 but it has come down to 70.  Her white count is 8900, creatinine is 0.9, BUN 14.  There have been several  urinalyses obtained.  The first one which was done on Feb 16, 2006 showed 7-  10 white cells per high-power field and many bacteria.  The urine culture  obtained at that time showed greater than 100,000 colonies of multiple  bacterial pathogens which were not further characterized.  Today she has had  two urinalyses done apparently.  The first showed 21-50 white cells and few  bacteria, the second showed only a few bacteria.  The patient is admitted at  this time for management of vomiting and dehydration with flank pain  possibly secondary to kidney stones and presumed pyelonephritis.   PAST MEDICAL HISTORY:   MEDICATIONS:  The patient takes no medications  on a regular basis.   ALLERGIES:  SHE HAS NO KNOWN DRUG ALLERGIES.   OPERATIONS:  The only operative procedure that has been performed was  probably a percutaneous nephrostomy and a recent procedure done at [facility]  in September.   MEDICAL ILLNESSES:  Otherwise none.   FAMILY HISTORY:  Unremarkable.   SOCIAL HISTORY:  The patient reports that she does not drink any alcohol.  She does smoke about one pack of cigarettes per day.  She does not abuse  drugs.   REVIEW OF SYSTEMS:  HEAD:  She denies headache or dizziness.  EYES:  She  denies visual blurring or diplopia.  EAR, NOSE AND THROAT:  Denies earache,  sinus pain or sore throat.  CHEST:  Denies coughing, wheezing or chest  congestion.  CARDIOVASCULAR:  Denies orthopnea, PND or ankle edema.  GI:  See above, note that there has been no hematemesis or melena, there has been  no abdominal pain only pain in the right flank, there has been no change in  bowel habits.  GU:  She  has not really noticed any dysuria.  NEURO:  There  is no history of seizure or stroke.  ENDO:  Denies excessive thirst, urinary  frequency or nocturia.   PHYSICAL EXAMINATION:  HEENT:  Exam is within normal limits.  CHEST:  The chest is clear.  BACK:  Examination of the back reveals a healed right flank incision.  There  is tenderness in the right flank area.  CARDIOVASCULAR:  Exam reveals normal S1 and S2 without rubs, murmurs or  gallops.  ABDOMEN:  The abdomen is quite benign.  There are good bowel sounds.  No  masses or tenderness.  No guarding or rebound.  NEUROLOGIC TESTING AND EXAMINATION OF EXTREMITIES:  Normal.   IMPRESSION:  1.  Right-sided calculus described as staghorn calculus status post      lithotripsy, status post percutaneous nephrostomy, status post double-J      stent 2004, status post lithotripsy 2005, status post open procedure      September 2006 at [facility].  2.  Probable pyelonephritis.  3.  Nausea, vomiting and dehydration.   PLAN:  The patient will be admitted for intravenous fluids.  We will  administer broad-spectrum antibiotics.  A CT scan of the abdomen was already  done on Feb 16, 2006 and I do not think it really needs to be repeated.  We  will obtain a urology consult for further assessment of this problem.           ______________________________  Sherin Quarry, MD     SY/MEDQ  D:  02/20/2006  T:  02/20/2006  Job:  [id number]   cc:   [place] Service, M.D.  Fax: [phone]

## 2011-03-06 NOTE — Discharge Summary (Signed)
[facility]  Patient:    [name], [name]                     MRN: [id number] Adm. Date:  [id number] Disc. Date: 10/20/00 Attending:  Antionette Char Dictator:   Ebbie Ridge, M.D. CC:         Bertram Millard. Dahlstedt, M.D.   Discharge Summary  DATE OF BIRTH:                [birth year]  DISCHARGE DIAGNOSES:          1. A 34-5/7-week intrauterine pregnancy.                               2. Urinary tract infection.                               3. Nephrolithiasis.                               4. History of medical noncompliance.  DISCHARGE MEDICATIONS:        1. Darvocet-N 100 one pill q.6h. p.r.n. pain,                                  #30 given.                               2. Keflex 500 mg p.o. q.d., #30 given.                               3. Prenatal vitamins 1 p.o. q.d., #30 with three                                  refills given.                               4. Iron sulfate 325 mg 1 p.o. q.d., #30 with                                  three refills given.                               5. Compazine 5 mg 1 p.o. q.6h. p.r.n. nausea,                                  #30 with no refills given.  PROCEDURES:                   1. Renal ultrasound revealed right                                  hydronephrosis.  2. Right percutaneous nephrostomy tube placement                                  on October 18, 2000.  No complications.  LABORATORY DATA:              Urinalysis on admission showed yellow turbid urine with specific gravity 1.025, pH 6.0, 15 ketones, large blood, 100 protein, positive nitrite, and large leukocyte with too numerous to count white blood cells, too numerous to count red blood cells, and many bacteria. CBC on admission revealed a normal white count of 8.1 with hemoglobin 10.8 and hematocrit 31.8.  Urine culture grew greater than 100,000 colonies of Staphylococcus aureus, sensitive to  everything except for penicillin.  On day of discharge, white blood count had lowered to 6.3.  HISTORY OF PRESENT ILLNESS:   [name] is a 35 year old G5, P1 3 0 3 who presented to [facility] ED complaining of nausea and vomiting x 2-1/2 days and right flank pain.  She also reported fever and chills x 2 days.  Pain was 7/10.  She denied hematuria or dysuria.  Given her history of kidney stones and report of currently having a kidney stone, she was admitted for IV antibiotics.  HOSPITAL COURSE:              The patient received Cefotan IV for a total of seven days.  After renal ultrasound showed right hydronephrosis, urology was contacted.  A right percutaneous nephrostomy tube was placed on October 18, 2000.  The patient tolerated the procedure without complications. The patient remained afebrile throughout her admission.  She required Vicodin and Darvocet for pain and Compazine for nausea.  On day #2 following nephrostomy tube placement, her pain had improved and she was without complaints.  Clear urine was draining from the tube.  She was discharged on Keflex for suppressive therapy, Darvocet for pain, and Compazine for nausea.  DISCHARGE INSTRUCTIONS:       She is to swab around the catheter daily for cleaning.  She should change the dressing as needed.  She is to call Dr. Barbra Sarks office to schedule an appointment for early to mid next week. She has a followup appointment previously scheduled at the high-risk clinic for October 27, 2000. DD:  10/20/00 TD:  10/20/00 Job: [id number]

## 2011-03-06 NOTE — Consult Note (Signed)
[facility]  Patient:    [name], [name]                     MRN: [id number] Proc. Date: 06/20/00 Adm. Date:  [id number] Attending:  Michaelle Copas                          Consultation Report  REFERRING PHYSICIAN:          Conni Elliot, M.D.  REASON FOR CONSULTATION:      Pyelonephritis and malfunctioning right stent with hydronephrosis.  HISTORY OF PRESENT ILLNESS:   This 35 year old black female is admitted with right flank pain and pelvic pain of at least one months duration.  It has been worse recently.  She is [redacted] weeks pregnant with her fifth child.  Also has been released from our practice due to underlying chronic noncompliance. We are giving her only emergency care at this time until she establishes a new physician.  The problem is her right ureteral stent, which was inserted February 2001 prior to scheduled lithotripsy, but she missed three different lithotripsy appointments for the actual procedure itself between February and March 2001.  She was advised to have the stent out, but she failed even to keep that appointment.  She saw Dr. Retta Diones on August 29 and May 17, 2000. She had a Pseudomonas UTI on May 17, 2000, which was sensitive only to Cipro, Tobramycin, and Levaquin.  She could not take that, since she was pregnant. She had another culture done August 29, but there were multiple species and sensitivities are not done routinely on that.  We requested those to be done anyway, and they are still pending at this time.  She has been afebrile but complained of back and side pain.  A right renal ultrasound showed moderate right hydronephrosis probably secondary to malfunction of the stent and numerous right renal calculi.  KUB shows a 10 x 9 mm stone in the right midkidney with some smaller stones also present.  The stent is present in good position, and the kidney part of the stent seems to be free of any stones.  The  bladder end, however, has a large stone that measures about 2.5 x 1 cm at least.  There are no stones seen along the course of the stent.  PHYSICAL EXAMINATION:  VITAL SIGNS:                  The patient is afebrile.  ABDOMEN:                      Soft and benign with some right CVA tenderness. Her uterus is palpable.  LYMPHATIC:                    No inguinal, cervical, or axillary adenopathy.  CHEST:                        Normal.  HEART:                        Normal.  IMPRESSION:                   1. Malfunction, right ureteral stent inserted February 2001 but not removed due to patients chronic noncompliance.  2. Large calcifications at the bladder end of the stent.                               3. Pyelonephritis with Pseudomonas.                               4. Intrauterine pregnancy of 11 weeks.  RECOMMENDATION:               Treat the patient with Cefotan as you are doing for at least 24-36 more hours, then discontinue.  The patient will call our office this week and make an appointment to have an outpatient procedure done at either [facility] or [facility].  She needs electrohydraulic lithotripsy of the stone on the bladder end of the stent before it can be removed. DD:  06/20/00 TD:  06/21/00 Job: [id number]

## 2011-03-06 NOTE — Op Note (Signed)
   [name], [name]                        ACCOUNT NO.:  [id number]   MEDICAL RECORD NO.:  [id number]                   PATIENT TYPE:  INP   LOCATION:  0378                                 FACILITY:  [facility]   PHYSICIAN:  Bertram Millard. Dahlstedt, M.D.          DATE OF BIRTH:  1976-09-10   DATE OF PROCEDURE:  12/08/2002  DATE OF DISCHARGE:                                 OPERATIVE REPORT   PREOPERATIVE DIAGNOSIS:  Right hydronephrosis and staghorn calculus.   POSTOPERATIVE DIAGNOSIS:  Right hydronephrosis and staghorn calculus.   PROCEDURE PERFORMED:  1. Cystoscopy.  2. Right retrograde ureteropyelogram.  3. Double-J stent placement.  4. Interpretive fluoroscopy.   SURGEON:  Bertram Millard. Dahlstedt, M.D.   FIRST ASSISTANT:  Crecencio Mc, M.D.   ANESTHESIA:  General.   COMPLICATIONS:  None.   BRIEF HISTORY:  A 35 year old female, who has had multiple right kidney  stones.  She has had noncompliant follow-up for stones.  She has had  obstruction and prior lithotripsies, at least 2-3 in [place].  She splits  her time between [place].  She has been back approximately  six months.  She recently presented with high fever and right flank pain and  CT urogram two days ago revealed right hydronephrosis and a staghorn  calculus.  Additionally, she had pyuria.  She was admitted by Valetta Fuller, M.D. and presents at this time for double-J stent placement.  She  will eventually need a percutaneous access to the kidney and a percutaneous  nephrolithotomy.   She is aware of risks and complications of anesthetic as well as stent  placement.  She desires to proceed.   DESCRIPTION OF PROCEDURE:  The patient was administered a general  anesthetic.  She was placed in the dorsal lithotomy position with genitalia  and perineum prepped and draped.  A 25 French panendoscope was placed into  her bladder which was normal.  A right retrograde ureteropyelogram was  performed  showing clubbing of the upper pole calices and filling defects in  these areas.  There was no ureteral obstruction.  A guidewire was then  placed, and a double-J stent, 24 cm x 6 [place], was placed over the  guidewire.  Fluoroscopically, there was adequate positioning of the stent  after the wire was removed.  The bladder was drained and the procedure  terminated.   The patient tolerated the procedure well.  She was awakened, extubated, and  taken to the PACU in stable condition.                                              Bertram Millard. Dahlstedt, M.D.   SMD/MEDQ  D:  12/08/2002  T:  12/08/2002  Job:  [id number]

## 2011-03-06 NOTE — Discharge Summary (Signed)
[facility]  Patient:    SARANDA, LEGRANDE                     MRN: [id number] Adm. Date:  [id number] Disc. Date: [id number] Attending:  Liborio Nixon Dictator:   Ebbie Ridge, M.D.                           Discharge Summary  CONSULTATIONS:                Urology, Rozanna Boer., M.D.  PROCEDURES:                   Renal ultrasound on June 20, 2000 demonstrated a right-sided hydronephrosis, increased from a prior ultrasound of June 23, 1999. There was apparent incrustation of the distal end of the patients right ureteral stent. Right renal calculi present.  DISCHARGE DIAGNOSES:          1. Pyelonephritis.                               2. Malfunction of the right ureteral stent.                               3. Large calcification of the bladder end of the                                  stent.                               4. Intrauterine pregnancy, 11-4/7 weeks.                               5. History of noncompliance.                               6. Pseudomonas infection.                               7. Mild antepartum hyperemesis.                               8. Abdominal pain.  DISCHARGE MEDICATIONS:        1. Tobramycin 100 mg IV q.8h. x six days (to be                                  administered by [facility].)                               2. Amoxicillin 500 mg p.o. t.i.d.                               3. Compazine 5 mg p.o. q.6h. p.r.n. nausea.  4. Vicodin one to two tabs q.6h. p.r.n. pain.                               5. Pyridium 200 mg p.o. q.8h. p.r.n. pain.  HOSPITAL COURSE:              Briefly, [name] is a 35 year old, G5, P1-3-0-3 who presented to [facility] via transfer from [facility] ED at 11 weeks 2 days intrauterine pregnancy complaining of fever, chills, right flank pain, and nausea. The patient had a history of pyelonephritis  with resistant organisms. She had being seeing Dr. Retta Diones for a malfunctioning ureteral stent with a large calcification of the bladder and of the stent. She was supposed to have outpatient lithotripsy done on several occasions, but did not keep her appointments. Dr. Aldean Ast very kindly saw the patient while she was in the hospital here and informed us that the patient had had a Pseudomonas UTI in the past that was sensitive only to Cipro, tobramycin, and Levaquin. A urine specimen has been obtained August 29 by his office and cultures and sensitivities were pending. Once those results were obtained, Dr. Aldean Ast recommended a seven day course of IV tobramycin and oral amoxicillin. At the end of those seven days the patient will follow up with Dr. Ala Bent office to have outpatient electrohydraulic lithotripsy of the stone at the bladder end of the stent. IV access was placed and arrangements were made for home health to administer medications. The patient was discharged on June 23, 2000 after a total of 84 hours of IV, in hospital, antibiotic therapy.  DISCHARGE FOLLOWUP:           [name] is to call Dr. Ala Bent office on the morning after discharge to schedule and outpatient appointment. She also has an appointment at [facility] to receive prenatal care on June 24, 2000 at 8 a.m. DD:  03/31/01 TD:  03/31/01 Job: [id number]

## 2011-03-06 NOTE — Op Note (Signed)
[facility]  Patient:    [name], [name]                     MRN: [id number] Proc. Date: 06/25/00 Adm. Date:  [id number] Disc. Date: [id number] Attending:  Antionette Char                           Operative Report  PREOPERATIVE DIAGNOSES: 1. Intrauterine pregnancy. 2. Right renal pelvic stone. 3. Right hydronephrosis. 4. Obstructed right ureteral stent. 5. Stone encrustation of distal stent - in bladder.  PRINCIPAL PROCEDURE:  Cystoscope, bladder stone lithotripsy, double-J stent exchange.  SURGEON:  Bertram Millard. Dahlstedt, M.D.  ANESTHESIA:  General.  COMPLICATIONS:  None.  BRIEF HISTORY:  A 35 year old female pregnant for the 5th time.  She has been very noncompliant with evaluation and management of stone disease.  She has had a stent in place since February.  She has not made three lithotripsy appointments.  She has presented with a Pseudomonas urinary tract infection, an obstructed right ureteral stent, a right renal pelvic stone (this was known beforehand), and a stone in her distal ureteral stent, within her bladder. She has had significant symptoms, low-grade fever, and has been on home IV antibiotics as management of this pyelonephritis.  As the stent is most likely obstructed and she is quite uncomfortable with in in place, it was recommended that she undergo cystoscopy, lithotripsy of her right distal ureteral stent stone, and stent exchange.  She is aware of risks and complications of the procedure which include bladder injury, infection, fever, bleeding, and loss of her child.  OB consultation has been obtained preoperatively.  She desires to proceed.  DESCRIPTION OF PROCEDURE:  The patient was administered a general anesthetic, placed in the dorsal lithotomy position.  Genitalia and perineum were cleansed and draped.  The resectoscope sheath was placed in her bladder, and a large bladder stone identified.  It was crushed with  the Lithotrite.  After adequate crushing, the stent was removed.  There were several encrustations along the stent.  A double-J stent was replaced in her right ureter using fluoroscopic guidance over a guidewire.  A 24 cm x 6 French stent was placed.  The stone fragments were irrigated from the bladder.  The patient tolerated the procedure well.  She was awakened and taken to the PACU in stable condition. DD:  06/25/00 TD:  06/27/00 Job: [id number]

## 2011-03-06 NOTE — H&P (Signed)
[name], [name]                        ACCOUNT NO.:  [id number]   MEDICAL RECORD NO.:  [id number]                   PATIENT TYPE:  OBV   LOCATION:  5531                                 FACILITY:  [facility]   PHYSICIAN:  Valetta Fuller, M.D.               DATE OF BIRTH:  Aug 30, 1976   DATE OF ADMISSION:  12/07/2002  DATE OF DISCHARGE:                                HISTORY & PHYSICAL   ADMITTING DIAGNOSES:  Right partial staghorn calculus, right flank pain, and  possible urosepsis.   HISTORY OF PRESENT ILLNESS:  The patient is a 34 year old female.  She is a  previous patient of Dr. Boston Service and  Dr. Bertram Millard. Dahlstedt.  She is a poor historian, and I do not have all of  her medical records.  It appears that she has had several episodes of  nephrolithiasis which sounds like at least a partial staghorn stone.  She  has required at least one percutaneous nephrostomy tube in the past and has  had several lithotripsies.  She does not recall when the last time that she  saw Dr. Retta Diones was, and does not appear to be especially complaint with  either stone prevention or routine followup.  The patient states that she  began experiencing some flank pain approximately two days ago.  She reports  at home that she has had fever and chills with a fever as high as 103.6  degrees.  She has been able to take p.o. reasonably well and has been  tolerating a general diet reasonably well.  She has had no dysuria, nor has  she had any gross hematuria.   PAST MEDICAL HISTORY:  Otherwise relatively unremarkable.  She tells me she  has had one or two bouts with asthma.  She reports no regular medications  and denies any allergies.   FAMILY HISTORY AND REVIEW OF SYSTEMS:  Otherwise noncontributory.   PHYSICAL EXAMINATION:  GENERAL:  She is a well-developed, well-nourished  female.  She appears to be in mid to moderate distress.  She is alert and  cooperative.  VITAL SIGNS:  Initial  temperature was afebrile, but she has been as high as  102 degrees in the emergency room.  Blood pressure most recently was 112/69  with a heart rate of 70 and respiratory rate of 20.  BACK:  Shows some mild right CVA tenderness.  ABDOMEN:  Otherwise benign.   LABORATORY DATA:  White blood cell count is normal at 5.5 thousand.  Renal  function is also normal with a creatinine of 0.8.  Urine shows 11-20 white  blood cells with 7-10 red cells and many bacteria.   LABORATORY DATA:  Non contrast CT shows partial staghorn stone with partial  obstruction of the right kidney and no obvious stones within the ureter.   ASSESSMENT:  Right partial staghorn stone.  This appears to be causing  partial obstruction, and  the patient does appear to have concurrent urinary  tract infection.  She is not grossly toxic in the emergency room here  tonight with a very low-grade temperature and a normal white blood cell  count.  On the other hand, the patient reports a temperature as high as 103  degrees at home with some shaking chills, suggesting the possibility of  urosepsis.  I think the prudent course of action is at least overnight  admission to monitor her clinical situation.  She is also requiring a  moderate amount of analgesia and will be admitted for pain control as well  as IV hydration and intravenous antibiotics.  If she remains relatively  afebrile and her pain control is better, she may be able to be discharged  tomorrow with a course of oral antibiotics and oral pain medicine with a  more definitive management down the road.  If she continues to be febrile or  continues to have significant pain, then she will require at least some type  of decompressive maneuver either with the double J stent placement or  percutaneous nephrostomy tube.  We will have Dr. Retta Diones  assume her care in the morning.                                               Valetta Fuller, M.D.    DSG/MEDQ  D:   12/07/2002  T:  12/07/2002  Job:  [id number]

## 2011-05-13 ENCOUNTER — Emergency Department (HOSPITAL_BASED_OUTPATIENT_CLINIC_OR_DEPARTMENT_OTHER)
Admission: EM | Admit: 2011-05-13 | Discharge: 2011-05-13 | Disposition: A | Discharge status: Home / Self Care | Payer: Medicaid Other | Attending: Emergency Medicine | Admitting: Emergency Medicine

## 2011-05-13 ENCOUNTER — Encounter: Payer: Self-pay | Admitting: Student

## 2011-05-13 DIAGNOSIS — R110 Nausea: Secondary | ICD-10-CM | POA: Insufficient documentation

## 2011-05-13 DIAGNOSIS — R1011 Right upper quadrant pain: Secondary | ICD-10-CM

## 2011-05-13 DIAGNOSIS — R51 Headache: Secondary | ICD-10-CM | POA: Insufficient documentation

## 2011-05-13 HISTORY — DX: Essential (primary) hypertension: I10

## 2011-05-13 HISTORY — DX: Calculus of kidney: N20.0

## 2011-05-13 LAB — CBC
HCT: 36.7 % (ref 36.0–46.0)
Hemoglobin: 12.5 g/dL (ref 12.0–15.0)
MCV: 94.6 fL (ref 78.0–100.0)
RBC: 3.88 MIL/uL (ref 3.87–5.11)
WBC: 5.5 10*3/uL (ref 4.0–10.5)

## 2011-05-13 LAB — DIFFERENTIAL
Basophils Absolute: 0 10*3/uL (ref 0.0–0.1)
Eosinophils Relative: 1 % (ref 0–5)
Lymphocytes Relative: 45 % (ref 12–46)
Lymphs Abs: 2.5 10*3/uL (ref 0.7–4.0)
Monocytes Absolute: 0.3 10*3/uL (ref 0.1–1.0)
Monocytes Relative: 6 % (ref 3–12)
Neutro Abs: 2.7 10*3/uL (ref 1.7–7.7)

## 2011-05-13 LAB — COMPREHENSIVE METABOLIC PANEL
AST: 16 U/L (ref 0–37)
BUN: 8 mg/dL (ref 6–23)
CO2: 22 mEq/L (ref 19–32)
Calcium: 9.3 mg/dL (ref 8.4–10.5)
Chloride: 103 mEq/L (ref 96–112)
Creatinine, Ser: 0.7 mg/dL (ref 0.50–1.10)
GFR calc Af Amer: >60 mL/min (ref >60)
GFR calc non Af Amer: >60 mL/min (ref >60)
Glucose, Bld: 86 mg/dL (ref 70–99)
Total Bilirubin: 0.2 mg/dL — ABNORMAL LOW (ref 0.3–1.2)

## 2011-05-13 LAB — URINALYSIS, ROUTINE W REFLEX MICROSCOPIC
Bilirubin Urine: NEGATIVE
Glucose, UA: NEGATIVE mg/dL
Hgb urine dipstick: NEGATIVE
Specific Gravity, Urine: 1.026 (ref 1.005–1.030)
Urobilinogen, UA: 0.2 mg/dL (ref 0.0–1.0)

## 2011-05-13 LAB — PREGNANCY, URINE: Preg Test, Ur: NEGATIVE

## 2011-05-13 LAB — WET PREP, GENITAL

## 2011-05-13 MED ORDER — METOPROLOL TARTRATE 25 MG PO TABS: 25.0000 mg | ORAL_TABLET | Freq: Every day | ORAL | Status: DC

## 2011-05-13 MED ORDER — ONDANSETRON HCL 4 MG PO TABS: 4.0000 mg | ORAL_TABLET | Freq: Four times a day (QID) | ORAL | Status: AC

## 2011-05-13 MED ORDER — MORPHINE SULFATE 4 MG/ML IJ SOLN
4.0000 mg | Freq: Once | INTRAMUSCULAR | Status: AC
  Administered 2011-05-13: 4 mg via INTRAVENOUS
  Filled 2011-05-13: qty 1

## 2011-05-13 MED ORDER — METOPROLOL TARTRATE 50 MG PO TABS: 25.0000 mg | ORAL_TABLET | Freq: Once | ORAL | Status: DC

## 2011-05-13 MED ORDER — SODIUM CHLORIDE 0.9 % IV BOLUS (SEPSIS)
1000.0000 mL | Freq: Once | INTRAVENOUS | Status: AC
  Administered 2011-05-13 (×2): 1000 mL via INTRAVENOUS

## 2011-05-13 MED ORDER — ONDANSETRON HCL 4 MG/2ML IJ SOLN
4.0000 mg | Freq: Once | INTRAMUSCULAR | Status: AC
  Administered 2011-05-13: 4 mg via INTRAVENOUS
  Filled 2011-05-13: qty 2

## 2011-05-13 MED ORDER — METOPROLOL TARTRATE 100 MG PO TABS: 25.0000 mg | ORAL_TABLET | Freq: Every day | ORAL | Status: DC

## 2011-05-13 MED ORDER — DIPHENHYDRAMINE HCL 50 MG/ML IJ SOLN
25.0000 mg | Freq: Once | INTRAMUSCULAR | Status: AC
  Administered 2011-05-13: 25 mg via INTRAVENOUS
  Filled 2011-05-13: qty 1

## 2011-05-13 NOTE — ED Provider Notes (Signed)
History     Chief Complaint  Patient presents with  . Headache  . Nausea  . Flank Pain    right side radiating to r abd region   HPI Comments: Patient states she has a history of frequent urinary tract infections and a staghorn calculus. I reviewed her old records and she also has a history of ureteral stent. Patient feels like she is having similar symptoms to that. The pain is located in her right flank and right abdomen. The pain seems to radiate from her flank towards her abdomen area. She denies any vaginal discharge or bleeding. No burning when she urinates. She started vomiting today but has not had any diarrhea.  Patient is a 35 y.o. female presenting with flank pain. The history is provided by the patient.  Flank Pain The current episode started more than 1 week ago. The problem occurs constantly. The problem has been gradually worsening. Associated symptoms include abdominal pain and headaches. Pertinent negatives include no chest pain and no shortness of breath. The symptoms are aggravated by nothing. The symptoms are relieved by nothing. She has tried acetaminophen for the symptoms.    Past Medical History  Diagnosis Date  . Kidney stones   . Hypertension     Past Surgical History  Procedure Date  . Lithotripsy   . Tubal ligation     History reviewed. No pertinent family history.  History  Substance Use Topics  . Smoking status: Current Everyday Smoker  . Smokeless tobacco: Never Used  . Alcohol Use: No    OB History    Grav Para Term Preterm Abortions TAB SAB Ect Mult Living                  Review of Systems  Constitutional: Positive for chills. Negative for fever.  Respiratory: Negative for shortness of breath.   Cardiovascular: Negative for chest pain.  Gastrointestinal: Positive for abdominal pain.  Genitourinary: Positive for flank pain. Negative for dysuria, vaginal bleeding, vaginal discharge and difficulty urinating.  Neurological: Positive for  headaches.  All other systems reviewed and are negative.    Physical Exam  BP 149/102  Pulse 95  Temp(Src) 98.7 F (37.1 C) (Oral)  Resp 20  Wt 212 lb (96.163 kg)  LMP 04/22/2011 hypertensive Physical Exam  Constitutional: She appears well-developed and well-nourished. No distress.  HENT:  Head: Normocephalic and atraumatic.  Right Ear: External ear normal.  Left Ear: External ear normal.  Eyes: Conjunctivae are normal. Right eye exhibits no discharge. Left eye exhibits no discharge. No scleral icterus.  Neck: Neck supple. No tracheal deviation present.  Cardiovascular: Normal rate, regular rhythm and intact distal pulses.   Pulmonary/Chest: Effort normal and breath sounds normal. No stridor. No respiratory distress. She has no wheezes. She has no rales.  Abdominal: Soft. Bowel sounds are normal. She exhibits no distension, no abdominal bruit, no ascites and no mass. There is tenderness in the right lower quadrant. There is CVA tenderness. There is no rigidity, no rebound, no guarding, no tenderness at McBurney's point and negative Murphy's sign. No hernia.  Genitourinary: Vagina normal and uterus normal. Pelvic exam was performed with patient supine. There is no rash, tenderness or lesion on the right labia. There is no rash, tenderness or lesion on the left labia. Cervix exhibits no motion tenderness and no discharge. Right adnexum displays no mass, no tenderness and no fullness. Left adnexum displays no mass, no tenderness and no fullness.  Musculoskeletal: She exhibits no edema  and no tenderness.  Neurological: She is alert. She has normal strength. No sensory deficit. Cranial nerve deficit:  no gross defecits noted. She exhibits normal muscle tone. She displays no seizure activity. Coordination normal.  Skin: Skin is warm and dry. No rash noted.  Psychiatric: She has a normal mood and affect.    ED Course  Procedures Labs Reviewed  URINALYSIS, ROUTINE W REFLEX MICROSCOPIC -  Abnormal; Notable for the following:    Appearance CLOUDY (*)    All other components within normal limits  PREGNANCY, URINE  CBC  DIFFERENTIAL  COMPREHENSIVE METABOLIC PANEL   Pt given IV fluids, morphine and zofran.  3:11 PM Pt is having a reaction to the morphine with a localized rash. Will give dose of benadryl.  No dyspnea.  Will continue to monitor  3:42 PM the patient feels better after the Benadryl  signs of airway compromise. No wheezing no angioedema. Repeat abdominal exam patient has only mild to palpation flank no abdominal tenderness in the right lower quadrant patient also complains of some mild discomfort or right upper quadrant this time.  MDM All labs were reviewed vital signs and nursing notes were reviewed. Patient was reexamined and does not have any significant pain. I doubt appendicitis no signs to suggest structuring. No signs to suggest ovarian torsion or PID based on pelvic exam. Biliary etiology is a possibility however the symptoms are rather mild and she has not really noticed any significant change with eating. I discussed possibly doing an ultrasound of her abdomen today to evaluate her kidney as well as her gallbladder however this time the patient states she is feeling better and unless absolutely necessary  feels comfortable going home and following up. Considering her examiner findings today I do feel it is reasonable. precautions were given      Celene Kras, MD 05/13/11 [phone]

## 2011-05-13 NOTE — ED Notes (Signed)
Pt given rx for lopressor 25 mg

## 2011-05-13 NOTE — ED Notes (Signed)
Pt in with c/o headache since yesterday with sporadic episodes (1-2) of V with associated N. Reports onset of R Flank pain radiating to R  Lower ABD region. Denies dysuria or other associated s/sx.

## 2011-05-14 LAB — GC/CHLAMYDIA PROBE AMP, GENITAL: Chlamydia, DNA Probe: NEGATIVE

## 2011-06-06 ENCOUNTER — Other Ambulatory Visit (INDEPENDENT_AMBULATORY_CARE_PROVIDER_SITE_OTHER): Payer: Self-pay

## 2011-06-18 ENCOUNTER — Emergency Department (HOSPITAL_BASED_OUTPATIENT_CLINIC_OR_DEPARTMENT_OTHER)
Admission: EM | Admit: 2011-06-18 | Discharge: 2011-06-18 | Disposition: A | Discharge status: Home / Self Care | Payer: Medicaid Other | Attending: Emergency Medicine | Admitting: Emergency Medicine

## 2011-06-18 ENCOUNTER — Emergency Department (INDEPENDENT_AMBULATORY_CARE_PROVIDER_SITE_OTHER): Payer: Medicaid Other

## 2011-06-18 ENCOUNTER — Encounter (HOSPITAL_BASED_OUTPATIENT_CLINIC_OR_DEPARTMENT_OTHER): Payer: Self-pay | Admitting: Family Medicine

## 2011-06-18 DIAGNOSIS — N200 Calculus of kidney: Secondary | ICD-10-CM

## 2011-06-18 DIAGNOSIS — R109 Unspecified abdominal pain: Secondary | ICD-10-CM

## 2011-06-18 DIAGNOSIS — I10 Essential (primary) hypertension: Secondary | ICD-10-CM | POA: Insufficient documentation

## 2011-06-18 LAB — URINALYSIS, ROUTINE W REFLEX MICROSCOPIC
Glucose, UA: NEGATIVE mg/dL
Nitrite: NEGATIVE
Specific Gravity, Urine: 1.031 — ABNORMAL HIGH (ref 1.005–1.030)
pH: 5.5 (ref 5.0–8.0)

## 2011-06-18 LAB — URINE MICROSCOPIC-ADD ON

## 2011-06-18 MED ORDER — HYDROMORPHONE HCL 1 MG/ML IJ SOLN
1.0000 mg | Freq: Once | INTRAMUSCULAR | Status: AC
  Administered 2011-06-18: 1 mg via INTRAMUSCULAR

## 2011-06-18 MED ORDER — PROMETHAZINE HCL 25 MG/ML IJ SOLN
25.0000 mg | Freq: Once | INTRAMUSCULAR | Status: AC
  Administered 2011-06-18: 25 mg via INTRAMUSCULAR
  Filled 2011-06-18: qty 1

## 2011-06-18 MED ORDER — HYDROMORPHONE HCL 1 MG/ML IJ SOLN
1.0000 mg | Freq: Once | INTRAMUSCULAR | Status: DC
  Filled 2011-06-18: qty 1

## 2011-06-18 MED ORDER — HYDROMORPHONE HCL 1 MG/ML IJ SOLN
1.0000 mg | Freq: Once | INTRAMUSCULAR | Status: AC
  Administered 2011-06-18: 1 mg via INTRAMUSCULAR
  Filled 2011-06-18: qty 1

## 2011-06-18 MED ORDER — SODIUM CHLORIDE 0.9 % IV BOLUS (SEPSIS): 1000.0000 mL | Freq: Once | INTRAVENOUS | Status: DC

## 2011-06-18 MED ORDER — ONDANSETRON 8 MG PO TBDP
8.0000 mg | ORAL_TABLET | Freq: Once | ORAL | Status: AC
  Administered 2011-06-18: 8 mg via ORAL
  Filled 2011-06-18: qty 1

## 2011-06-18 MED ORDER — ONDANSETRON 4 MG PO TBDP: 4.0000 mg | ORAL_TABLET | Freq: Three times a day (TID) | ORAL | Status: AC | PRN

## 2011-06-18 NOTE — ED Notes (Signed)
Pt c/o right flank pain x 2-3 days with n/v. Pt reports "cloudy" urine and h/o uti and kidney stones.

## 2011-06-18 NOTE — ED Notes (Signed)
Charge nurse at bedside attempting IV

## 2011-06-18 NOTE — ED Provider Notes (Signed)
History    Patient presents with right flank pain. This she notes that her pain started gradually 3 days prior to admission. Since onset she has had nearly consistent sharp crampy pain about the right flank, with radiation towards the anterior suprapubic area. No other pain. No dysuria. No vaginal complaints. She does complain of fevers and chills, though has no objective fever at home. No headache, no other complaints. She notes no recent interventions for her remarkable history of multiple kidney stones, though she has had lithotripsy, nephrostomies, and stenting in the past. She has a urologist in [place]. CSN: [id number] Arrival date & time: 06/18/2011  7:42 AM  Chief Complaint  Patient presents with  . Flank Pain   HPI  Past Medical History  Diagnosis Date  . Kidney stones   . Hypertension     Past Surgical History  Procedure Date  . Lithotripsy   . Tubal ligation     No family history on file.  History  Substance Use Topics  . Smoking status: Current Everyday Smoker  . Smokeless tobacco: Never Used  . Alcohol Use: No    OB History    Grav Para Term Preterm Abortions TAB SAB Ect Mult Living                  Review of Systems Gen: Per HPI HEENT: No HA CV: No CP Resp: No dyspnea Abd: Per HPI, otherwise negative Musk: Per HPI, otherwise negative Neuro: No dysesthesia, or focal changes GU: Per HPI, otherwise negative Skin: Neg Psych: Neg    Physical Exam  BP 135/95  Pulse 88  Temp(Src) 98.1 F (36.7 C) (Oral)  Resp 18  Ht 5\' 4"  (1.626 m)  Wt 212 lb (96.163 kg)  BMI 36.39 kg/m2  SpO2 100%  Physical Exam  ED Course  Procedures 9:19 AM. Unable to secure IV access. Patient remained stable, afebrile. She will receive IM medications. X-ray demonstrates right nephrolithiasis. Urinalysis notable for crystals, and significant blood, no overt sign of infection.  10 AM. Patient notes her pain is down from 8-5. Additional analgesia ordered. If she remains  stable, with improvement pain, and no new complaints, she will be discharged to follow up with her urologist.  1:30 p.m. following multiple rounds of antiemetics, the patient has been until better, she tolerating liquids.  MDM Female history of kidney stones, chronic abdominal pain now presents with worsening of the pain and nausea. Following initial analgesics the pain is significantly improved, but the patient remained nauseous, though this improved with multiple rounds of antibiotics. Labs, and x-ray consistent with right ureteral stone. Absent fevers, any signs of distress, and by mouth tolerance the patient is safe to follow up with the urologist. She notes that she has a urologist, issues or prior multiple invasive procedures for removal of stones the past, and is capable of following up with him in the next one or 2 days.      Gerhard Munch, MD 06/18/11 1336

## 2011-06-18 NOTE — ED Notes (Signed)
Attempted IV without success. Pt taken to xr at this time.

## 2011-06-18 NOTE — ED Notes (Signed)
MD at bedside. 

## 2011-07-09 LAB — DIFFERENTIAL
Basophils Absolute: 0.1
Basophils Relative: 1
Eosinophils Relative: 1
Eosinophils Relative: 1
Lymphocytes Relative: 45
Lymphocytes Relative: 50 — ABNORMAL HIGH
Lymphs Abs: 2.4
Lymphs Abs: 2.8
Monocytes Absolute: 0.3
Monocytes Absolute: 0.4
Monocytes Relative: 5
Monocytes Relative: 7
Neutro Abs: 2.4
Neutro Abs: 3
Neutrophils Relative %: 46

## 2011-07-09 LAB — CBC
HCT: 37.7
Hemoglobin: 13
MCHC: 33.3
MCHC: 34.4
MCV: 94.5
MCV: 95.8
Platelets: 292
Platelets: 341
RBC: 3.99
RDW: 13.5
RDW: 13.7
WBC: 5.2

## 2011-07-09 LAB — HEPATIC FUNCTION PANEL
ALT: 12
Alkaline Phosphatase: 56
Total Bilirubin: 0.6
Total Protein: 6.1

## 2011-07-09 LAB — COMPREHENSIVE METABOLIC PANEL
AST: 17
Albumin: 3.1 — ABNORMAL LOW
Alkaline Phosphatase: 63
BUN: 4 — ABNORMAL LOW
Calcium: 8 — ABNORMAL LOW
Creatinine, Ser: 0.97
Creatinine, Ser: 0.99
GFR calc Af Amer: >60
GFR calc non Af Amer: >60
Glucose, Bld: 111 — ABNORMAL HIGH
Potassium: 3.7
Sodium: 133 — ABNORMAL LOW
Total Bilirubin: 0.6
Total Protein: 5.8 — ABNORMAL LOW
Total Protein: 6.1

## 2011-07-09 LAB — BASIC METABOLIC PANEL
BUN: 12
BUN: 8
BUN: 8
CO2: 25
Calcium: 8.1 — ABNORMAL LOW
Chloride: 107
Chloride: 110
Creatinine, Ser: 0.92
Creatinine, Ser: 0.98
GFR calc non Af Amer: >60
Glucose, Bld: 83
Potassium: 3.9
Potassium: 4

## 2011-07-09 LAB — URINALYSIS, ROUTINE W REFLEX MICROSCOPIC
Glucose, UA: NEGATIVE
Hgb urine dipstick: NEGATIVE
Nitrite: NEGATIVE
Protein, ur: NEGATIVE
Specific Gravity, Urine: 1.026
Urobilinogen, UA: 1
pH: 6

## 2011-07-09 LAB — URINE MICROSCOPIC-ADD ON

## 2011-07-09 LAB — URINE CULTURE
Colony Count: >=100000
Colony Count: >=100000

## 2011-07-09 LAB — POCT PREGNANCY, URINE
Operator id: 285841
Preg Test, Ur: NEGATIVE

## 2011-07-10 LAB — URINE CULTURE: Colony Count: >=100000

## 2011-07-10 LAB — URINALYSIS, ROUTINE W REFLEX MICROSCOPIC
Bilirubin Urine: NEGATIVE
Nitrite: POSITIVE — AB
Protein, ur: NEGATIVE
Specific Gravity, Urine: 1.024
Urobilinogen, UA: 1

## 2011-07-10 LAB — URINE MICROSCOPIC-ADD ON

## 2011-07-10 LAB — PREGNANCY, URINE: Preg Test, Ur: NEGATIVE

## 2011-07-13 LAB — URINALYSIS, ROUTINE W REFLEX MICROSCOPIC
Bilirubin Urine: NEGATIVE
Glucose, UA: NEGATIVE
Hgb urine dipstick: NEGATIVE
Ketones, ur: NEGATIVE
Nitrite: NEGATIVE
Protein, ur: NEGATIVE
Protein, ur: NEGATIVE
Specific Gravity, Urine: 1.016
Urobilinogen, UA: 1

## 2011-07-13 LAB — DIFFERENTIAL
Basophils Absolute: 0
Basophils Relative: 0
Eosinophils Absolute: 0
Eosinophils Relative: 1
Lymphocytes Relative: 33
Monocytes Absolute: 0.2

## 2011-07-13 LAB — CBC
HCT: 37
MCHC: 34.1
Platelets: 316
RDW: 13.3

## 2011-07-13 LAB — BASIC METABOLIC PANEL WITH GFR
BUN: 8
CO2: 24
Calcium: 9
Chloride: 107
Creatinine, Ser: 0.79
GFR calc non Af Amer: >60
Glucose, Bld: 97
Potassium: 4.4
Sodium: 138

## 2011-07-13 LAB — URINE CULTURE: Colony Count: 4000

## 2011-07-13 LAB — URINE MICROSCOPIC-ADD ON

## 2011-07-13 LAB — PREGNANCY, URINE: Preg Test, Ur: NEGATIVE

## 2011-07-14 LAB — URINALYSIS, ROUTINE W REFLEX MICROSCOPIC
Bilirubin Urine: NEGATIVE
Ketones, ur: NEGATIVE
Nitrite: NEGATIVE
Protein, ur: 30 — AB
Urobilinogen, UA: 0.2
pH: 7.5

## 2011-07-14 LAB — DIFFERENTIAL
Basophils Relative: 1
Lymphocytes Relative: 16
Monocytes Absolute: 0.3
Monocytes Relative: 4
Neutro Abs: 6.3
Neutrophils Relative %: 78 — ABNORMAL HIGH

## 2011-07-14 LAB — URINE CULTURE

## 2011-07-14 LAB — CBC
Hemoglobin: 12
MCHC: 33.4
RBC: 3.75 — ABNORMAL LOW
WBC: 8.1

## 2011-07-14 LAB — PREGNANCY, URINE: Preg Test, Ur: NEGATIVE

## 2011-07-16 ENCOUNTER — Emergency Department (HOSPITAL_BASED_OUTPATIENT_CLINIC_OR_DEPARTMENT_OTHER)
Admission: EM | Admit: 2011-07-16 | Discharge: 2011-07-16 | Disposition: A | Discharge status: Home / Self Care | Payer: Medicaid Other | Attending: Emergency Medicine | Admitting: Emergency Medicine

## 2011-07-16 ENCOUNTER — Encounter (HOSPITAL_BASED_OUTPATIENT_CLINIC_OR_DEPARTMENT_OTHER): Payer: Self-pay | Admitting: *Deleted

## 2011-07-16 DIAGNOSIS — R300 Dysuria: Secondary | ICD-10-CM

## 2011-07-16 DIAGNOSIS — I10 Essential (primary) hypertension: Secondary | ICD-10-CM | POA: Insufficient documentation

## 2011-07-16 DIAGNOSIS — F17200 Nicotine dependence, unspecified, uncomplicated: Secondary | ICD-10-CM | POA: Insufficient documentation

## 2011-07-16 LAB — POCT I-STAT, CHEM 8
BUN: 9
Calcium, Ion: 1.1 — ABNORMAL LOW
Chloride: 104
Glucose, Bld: 112 — ABNORMAL HIGH
TCO2: 24

## 2011-07-16 LAB — DIFFERENTIAL
Basophils Relative: 0
Eosinophils Relative: 0
Lymphocytes Relative: 5 — ABNORMAL LOW
Monocytes Absolute: 0.7
Monocytes Relative: 4
Neutro Abs: 13.6 — ABNORMAL HIGH

## 2011-07-16 LAB — URINE MICROSCOPIC-ADD ON

## 2011-07-16 LAB — CBC
HCT: 37.2
Hemoglobin: 12.9
MCHC: 34.6
RBC: 3.93
RDW: 13.5

## 2011-07-16 LAB — RAPID STREP SCREEN (MED CTR MEBANE ONLY): Streptococcus, Group A Screen (Direct): POSITIVE — AB

## 2011-07-16 LAB — URINALYSIS, ROUTINE W REFLEX MICROSCOPIC
Bilirubin Urine: NEGATIVE
Glucose, UA: NEGATIVE
Glucose, UA: NEGATIVE mg/dL
Leukocytes, UA: NEGATIVE
Protein, ur: NEGATIVE mg/dL
Specific Gravity, Urine: 1.019
Specific Gravity, Urine: 1.03 (ref 1.005–1.030)
pH: 6.5 (ref 5.0–8.0)

## 2011-07-16 LAB — URINE CULTURE: Colony Count: 70000

## 2011-07-16 LAB — PREGNANCY, URINE: Preg Test, Ur: NEGATIVE

## 2011-07-16 MED ORDER — ONDANSETRON 4 MG PO TBDP
4.0000 mg | ORAL_TABLET | Freq: Once | ORAL | Status: AC
  Administered 2011-07-16: 4 mg via ORAL
  Filled 2011-07-16: qty 1

## 2011-07-16 NOTE — ED Notes (Signed)
Pt. Is using computer in the stretcher and fully clothed.  Asked pt. To change incase of any assessment changes.

## 2011-07-16 NOTE — ED Provider Notes (Signed)
History     CSN: [id number] Arrival date & time: 07/16/2011  1:05 PM  Chief Complaint  Patient presents with  . Dysuria    (Consider location/radiation/quality/duration/timing/severity/associated sxs/prior treatment) HPI Comments: Pt states that it doesn't feel like previous stones  Patient is a 35 y.o. female presenting with dysuria. The history is provided by the patient.  Dysuria  This is a recurrent problem. The current episode started more than 2 days ago. The problem occurs every urination. The problem has been gradually worsening. The quality of the pain is described as aching. The pain is moderate. The maximum temperature recorded prior to her arrival was 100 to 100.9 F. Associated symptoms include nausea and flank pain. Pertinent negatives include no vomiting. She has tried acetaminophen for the symptoms. Her past medical history is significant for kidney stones.    Past Medical History  Diagnosis Date  . Kidney stones   . Hypertension     Past Surgical History  Procedure Date  . Lithotripsy   . Tubal ligation     No family history on file.  History  Substance Use Topics  . Smoking status: Current Everyday Smoker  . Smokeless tobacco: Never Used  . Alcohol Use: No    OB History    Grav Para Term Preterm Abortions TAB SAB Ect Mult Living                  Review of Systems  Gastrointestinal: Positive for nausea. Negative for vomiting.  Genitourinary: Positive for dysuria and flank pain. Negative for vaginal bleeding and vaginal discharge.  All other systems reviewed and are negative.    Allergies  Flagyl; Percocet; Reglan; and Toradol  Home Medications   Current Outpatient Rx  Name Route Sig Dispense Refill  . METOPROLOL TARTRATE 25 MG PO TABS Oral Take 1 tablet (25 mg total) by mouth daily. 30 tablet 0    BP 145/103  Pulse 76  Temp(Src) 98.4 F (36.9 C) (Oral)  Resp 20  SpO2 100%  Physical Exam  Nursing note and vitals  reviewed. Constitutional: She is oriented to person, place, and time. She appears well-developed and well-nourished.  HENT:  Head: Normocephalic.  Neck: Normal range of motion.  Cardiovascular: Normal rate and regular rhythm.   Pulmonary/Chest: Effort normal and breath sounds normal.  Abdominal: Soft. She exhibits no distension.  Musculoskeletal: Normal range of motion.  Neurological: She is alert and oriented to person, place, and time.  Skin: Skin is warm and dry.  Psychiatric: She has a normal mood and affect.    ED Course  Procedures (including critical care time)  Results for orders placed during the hospital encounter of 07/16/11  URINALYSIS, ROUTINE W REFLEX MICROSCOPIC      Component Value Range   Color, Urine YELLOW  YELLOW    Appearance CLOUDY (*) CLEAR    Specific Gravity, Urine 1.030  1.005 - 1.030    pH 6.5  5.0 - 8.0    Glucose, UA NEGATIVE  NEGATIVE (mg/dL)   Hgb urine dipstick LARGE (*) NEGATIVE    Bilirubin Urine NEGATIVE  NEGATIVE    Ketones, ur NEGATIVE  NEGATIVE (mg/dL)   Protein, ur NEGATIVE  NEGATIVE (mg/dL)   Urobilinogen, UA 1.0  0.0 - 1.0 (mg/dL)   Nitrite NEGATIVE  NEGATIVE    Leukocytes, UA NEGATIVE  NEGATIVE   PREGNANCY, URINE      Component Value Range   Preg Test, Ur NEGATIVE    URINE MICROSCOPIC-ADD ON  Component Value Range   Squamous Epithelial / LPF RARE  RARE    RBC / HPF 3-6  <3 (RBC/hpf)   Bacteria, UA RARE  RARE        MDM  Pt tolerating po without any problem:urine sent for culture for blood in the urine:pt states that it doesn't feel like stone:no need for imagining  At this time        Teressa Lower, NP 07/16/11 1353

## 2011-07-16 NOTE — ED Notes (Signed)
3 days of dysuria, nausea, and lower back pain into her right flank.

## 2011-07-16 NOTE — ED Provider Notes (Signed)
Medical screening examination/treatment/procedure(s) were performed by non-physician practitioner and as supervising physician I was immediately available for consultation/collaboration.   Dayton Bailiff, MD 07/16/11 [phone]

## 2011-07-16 NOTE — ED Notes (Signed)
Pt. In no distress and no nausea or vomiting noted.

## 2011-07-17 LAB — POCT I-STAT, CHEM 8
BUN: 9
Calcium, Ion: 1.1 — ABNORMAL LOW
Creatinine, Ser: 1
Hemoglobin: 14.3
TCO2: 24

## 2011-07-17 LAB — DIFFERENTIAL
Basophils Absolute: 0
Eosinophils Absolute: 0
Eosinophils Relative: 0
Lymphocytes Relative: 30
Lymphs Abs: 2.2
Monocytes Absolute: 0.5

## 2011-07-17 LAB — URINALYSIS, ROUTINE W REFLEX MICROSCOPIC
Bilirubin Urine: NEGATIVE
Glucose, UA: NEGATIVE
Hgb urine dipstick: NEGATIVE
Ketones, ur: 15 — AB
Ketones, ur: NEGATIVE
Nitrite: NEGATIVE
Protein, ur: NEGATIVE
Protein, ur: NEGATIVE
pH: 7

## 2011-07-17 LAB — POCT PREGNANCY, URINE: Preg Test, Ur: NEGATIVE

## 2011-07-17 LAB — CBC
HCT: 39.3
Hemoglobin: 13.1
MCV: 93.5
RDW: 14.3

## 2011-07-17 LAB — URINE CULTURE
Colony Count: 25000
Colony Count: >=100000

## 2011-07-17 LAB — URINE MICROSCOPIC-ADD ON

## 2011-07-20 LAB — URINE CULTURE: Colony Count: >=100000

## 2011-07-20 LAB — DIFFERENTIAL
Eosinophils Absolute: 0.1
Eosinophils Relative: 1
Lymphs Abs: 2.5
Monocytes Relative: 6

## 2011-07-20 LAB — POCT I-STAT, CHEM 8
Calcium, Ion: 1.03 — ABNORMAL LOW
Creatinine, Ser: 1
Creatinine, Ser: 1
Glucose, Bld: 92
Hemoglobin: 14.3
Hemoglobin: 14.3
Sodium: 140
Sodium: 140
TCO2: 23
TCO2: 24

## 2011-07-20 LAB — URINALYSIS, ROUTINE W REFLEX MICROSCOPIC
Bilirubin Urine: NEGATIVE
Glucose, UA: NEGATIVE
Nitrite: POSITIVE — AB
Protein, ur: 100 — AB
Protein, ur: 100 — AB
Urobilinogen, UA: 1

## 2011-07-20 LAB — CBC
HCT: 39.2
MCHC: 33.3
MCV: 97.4
MCV: 97.5
Platelets: 295
Platelets: 301
WBC: 6.2

## 2011-07-20 LAB — URINE MICROSCOPIC-ADD ON

## 2011-07-20 LAB — POCT PREGNANCY, URINE: Preg Test, Ur: NEGATIVE

## 2011-07-22 LAB — URINALYSIS, ROUTINE W REFLEX MICROSCOPIC
Hgb urine dipstick: NEGATIVE
Ketones, ur: NEGATIVE
Protein, ur: NEGATIVE
Urobilinogen, UA: 1

## 2011-07-22 LAB — URINE CULTURE

## 2011-07-22 LAB — URINE MICROSCOPIC-ADD ON

## 2011-07-22 LAB — POCT PREGNANCY, URINE: Preg Test, Ur: NEGATIVE

## 2011-07-24 LAB — URINALYSIS, ROUTINE W REFLEX MICROSCOPIC
Glucose, UA: NEGATIVE mg/dL
Hgb urine dipstick: NEGATIVE
Protein, ur: NEGATIVE mg/dL

## 2011-07-24 LAB — URINE MICROSCOPIC-ADD ON

## 2011-07-24 LAB — WET PREP, GENITAL
Trich, Wet Prep: NONE SEEN
Yeast Wet Prep HPF POC: NONE SEEN

## 2011-07-24 LAB — GC/CHLAMYDIA PROBE AMP, GENITAL: GC Probe Amp, Genital: NEGATIVE

## 2011-07-27 LAB — POCT URINALYSIS DIP (DEVICE)
Operator id: 116391
Protein, ur: 100 — AB
Specific Gravity, Urine: >=1.03
Urobilinogen, UA: 1

## 2011-07-27 LAB — POCT PREGNANCY, URINE
Operator id: 116391
Preg Test, Ur: NEGATIVE

## 2011-09-14 ENCOUNTER — Emergency Department (HOSPITAL_BASED_OUTPATIENT_CLINIC_OR_DEPARTMENT_OTHER)
Admission: EM | Admit: 2011-09-14 | Discharge: 2011-09-14 | Disposition: A | Discharge status: Home / Self Care | Payer: Medicaid Other | Attending: Emergency Medicine | Admitting: Emergency Medicine

## 2011-09-14 ENCOUNTER — Encounter (HOSPITAL_BASED_OUTPATIENT_CLINIC_OR_DEPARTMENT_OTHER): Payer: Self-pay | Admitting: *Deleted

## 2011-09-14 ENCOUNTER — Emergency Department (INDEPENDENT_AMBULATORY_CARE_PROVIDER_SITE_OTHER): Payer: Medicaid Other

## 2011-09-14 DIAGNOSIS — R110 Nausea: Secondary | ICD-10-CM

## 2011-09-14 DIAGNOSIS — R109 Unspecified abdominal pain: Secondary | ICD-10-CM

## 2011-09-14 DIAGNOSIS — N2889 Other specified disorders of kidney and ureter: Secondary | ICD-10-CM

## 2011-09-14 DIAGNOSIS — Z87442 Personal history of urinary calculi: Secondary | ICD-10-CM

## 2011-09-14 LAB — URINALYSIS, ROUTINE W REFLEX MICROSCOPIC
Glucose, UA: NEGATIVE mg/dL
Ketones, ur: NEGATIVE mg/dL
Leukocytes, UA: NEGATIVE
Protein, ur: NEGATIVE mg/dL

## 2011-09-14 MED ORDER — ONDANSETRON HCL 4 MG/2ML IJ SOLN: 4.0000 mg | Freq: Once | INTRAMUSCULAR | Status: DC

## 2011-09-14 MED ORDER — ONDANSETRON 4 MG PO TBDP
4.0000 mg | ORAL_TABLET | Freq: Once | ORAL | Status: AC
  Administered 2011-09-14: 4 mg via ORAL
  Filled 2011-09-14: qty 1

## 2011-09-14 NOTE — ED Provider Notes (Addendum)
History     CSN: [id number] Arrival date & time: 09/14/2011  8:00 PM   First MD Initiated Contact with Patient 09/14/11 2002      Chief Complaint  Patient presents with  . Flank Pain    (Consider location/radiation/quality/duration/timing/severity/associated sxs/prior treatment) HPI Patient with right flank pain began a week ago.  Patient states pain is sharp and feels like previous kidney stone.  She denies hematuria but has noted dark urine.  Patient seen by pmd last week for bp follow up and had urine dipped and reported negative.  Patient has had 14 kidney stones in past.  Patient with nausea but no vomiting.  No fever or chills.  Patient was followed by urology at [facility].  Last kidney stone one year ago.  No uti symptoms.   Past Medical History  Diagnosis Date  . Kidney stones   . Hypertension     Past Surgical History  Procedure Date  . Lithotripsy   . Tubal ligation     No family history on file.  History  Substance Use Topics  . Smoking status: Current Everyday Smoker  . Smokeless tobacco: Never Used  . Alcohol Use: No    OB History    Grav Para Term Preterm Abortions TAB SAB Ect Mult Living                  Review of Systems  All other systems reviewed and are negative.    Allergies  Flagyl; Percocet; Reglan; and Toradol  Home Medications   Current Outpatient Rx  Name Route Sig Dispense Refill  . METOPROLOL TARTRATE 25 MG PO TABS Oral Take 1 tablet (25 mg total) by mouth daily. 30 tablet 0    BP 133/97  Pulse 76  Temp(Src) 98.2 F (36.8 C) (Oral)  Resp 22  SpO2 100%  Physical Exam  Nursing note and vitals reviewed. Constitutional: She is oriented to person, place, and time. She appears well-developed and well-nourished.  HENT:  Head: Normocephalic and atraumatic.  Eyes: Conjunctivae and EOM are normal. Pupils are equal, round, and reactive to light.  Neck: Normal range of motion. Neck supple.  Cardiovascular: Normal rate and regular  rhythm.   Pulmonary/Chest: Effort normal and breath sounds normal.  Abdominal: Soft.  Musculoskeletal: Normal range of motion.  Neurological: She is alert and oriented to person, place, and time. She has normal reflexes.  Skin: Skin is warm and dry.  Psychiatric: She has a normal mood and affect.    ED Course  Procedures (including critical care time)  Labs Reviewed  URINALYSIS, ROUTINE W REFLEX MICROSCOPIC - Abnormal; Notable for the following:    Appearance CLOUDY (*)    All other components within normal limits  PREGNANCY, URINE   No results found.   No diagnosis found.    MDM  Patient without evidence of right ureteral calculus and has had greater than forty ct scans.  She is advised to follow up with urology.       Hilario Quarry, MD 09/14/11 [id number]  Hilario Quarry, MD 09/14/11 2100

## 2011-09-14 NOTE — ED Notes (Signed)
Right flank pain x 4 days. Nausea.

## 2011-09-14 NOTE — ED Notes (Signed)
Unable to draw labs. MD aware and orders canceled.

## 2012-03-25 ENCOUNTER — Emergency Department (HOSPITAL_BASED_OUTPATIENT_CLINIC_OR_DEPARTMENT_OTHER): Payer: Medicaid Other

## 2012-03-25 ENCOUNTER — Encounter (HOSPITAL_BASED_OUTPATIENT_CLINIC_OR_DEPARTMENT_OTHER): Payer: Self-pay | Admitting: *Deleted

## 2012-03-25 ENCOUNTER — Emergency Department (HOSPITAL_BASED_OUTPATIENT_CLINIC_OR_DEPARTMENT_OTHER)
Admission: EM | Admit: 2012-03-25 | Discharge: 2012-03-26 | Disposition: A | Discharge status: Home / Self Care | Payer: Medicaid Other | Attending: Emergency Medicine | Admitting: Emergency Medicine

## 2012-03-25 DIAGNOSIS — R319 Hematuria, unspecified: Secondary | ICD-10-CM | POA: Insufficient documentation

## 2012-03-25 DIAGNOSIS — F17200 Nicotine dependence, unspecified, uncomplicated: Secondary | ICD-10-CM | POA: Insufficient documentation

## 2012-03-25 DIAGNOSIS — R109 Unspecified abdominal pain: Secondary | ICD-10-CM

## 2012-03-25 DIAGNOSIS — N390 Urinary tract infection, site not specified: Secondary | ICD-10-CM | POA: Insufficient documentation

## 2012-03-25 DIAGNOSIS — Z79899 Other long term (current) drug therapy: Secondary | ICD-10-CM | POA: Insufficient documentation

## 2012-03-25 DIAGNOSIS — I10 Essential (primary) hypertension: Secondary | ICD-10-CM | POA: Insufficient documentation

## 2012-03-25 DIAGNOSIS — Z87442 Personal history of urinary calculi: Secondary | ICD-10-CM | POA: Insufficient documentation

## 2012-03-25 LAB — URINALYSIS, ROUTINE W REFLEX MICROSCOPIC
Bilirubin Urine: NEGATIVE
Specific Gravity, Urine: 1.024 (ref 1.005–1.030)
Urobilinogen, UA: 0.2 mg/dL (ref 0.0–1.0)
pH: 6 (ref 5.0–8.0)

## 2012-03-25 LAB — URINE MICROSCOPIC-ADD ON

## 2012-03-25 MED ORDER — METOPROLOL TARTRATE 12.5 MG HALF TABLET
12.5000 mg | ORAL_TABLET | Freq: Once | ORAL | Status: DC
  Filled 2012-03-25: qty 1

## 2012-03-25 MED ORDER — HYDROMORPHONE HCL PF 2 MG/ML IJ SOLN
INTRAMUSCULAR | Status: AC
  Administered 2012-03-25: 2 mg
  Filled 2012-03-25: qty 1

## 2012-03-25 MED ORDER — ONDANSETRON HCL 4 MG/2ML IJ SOLN
4.0000 mg | Freq: Once | INTRAMUSCULAR | Status: DC
  Filled 2012-03-25: qty 2

## 2012-03-25 MED ORDER — SODIUM CHLORIDE 0.9 % IV BOLUS (SEPSIS)
1000.0000 mL | Freq: Once | INTRAVENOUS | Status: AC
  Administered 2012-03-26: 1000 mL via INTRAVENOUS

## 2012-03-25 MED ORDER — DEXTROSE 5 % IV SOLN
1.0000 g | Freq: Once | INTRAVENOUS | Status: AC
  Administered 2012-03-26: 1 g via INTRAVENOUS
  Filled 2012-03-25: qty 10

## 2012-03-25 MED ORDER — MORPHINE SULFATE 4 MG/ML IJ SOLN
4.0000 mg | Freq: Once | INTRAMUSCULAR | Status: AC
  Administered 2012-03-25: 4 mg via INTRAVENOUS
  Filled 2012-03-25: qty 1

## 2012-03-25 MED ORDER — METOPROLOL TARTRATE 50 MG PO TABS
ORAL_TABLET | ORAL | Status: AC
  Administered 2012-03-26: 12.5 mg
  Filled 2012-03-25: qty 1

## 2012-03-25 MED ORDER — ONDANSETRON HCL 4 MG/2ML IJ SOLN
4.0000 mg | Freq: Once | INTRAMUSCULAR | Status: AC
  Administered 2012-03-25: 4 mg via INTRAVENOUS

## 2012-03-25 MED ORDER — ONDANSETRON 4 MG PO TBDP: 4.0000 mg | ORAL_TABLET | Freq: Once | ORAL | Status: DC

## 2012-03-25 NOTE — ED Notes (Signed)
Right flank pain x 2 days. Nauseated. Crying at triage. Hx of kidney stones

## 2012-03-25 NOTE — ED Provider Notes (Signed)
History     CSN: [id number]  Arrival date & time 03/25/12  2151   First MD Initiated Contact with Patient 03/25/12 2204      Chief Complaint  Patient presents with  . Flank Pain    (Consider location/radiation/quality/duration/timing/severity/associated sxs/prior treatment) HPI Patient is a 36 year old female with history of staghorn renal calculi who presents today complaining of right-sided flank pain with associated nausea and vomiting for the past 2 days. Patient was seen last week at [facility] with similar symptoms and reports having a CAT scan which demonstrated numerous stones. Patient reports that today her symptoms are much worse. She has history of nephrostomy tube placement and has been followed in the past at [facility]. She had Vicodin at home are reported that this was not handling her symptoms. Patient has history of hypertension and took her morning medications but did not take her evening medication. Patient was very hypertensive as well as tachycardic on presentation. She has associated nausea. Patient reports that she is never able to pass her stones due to history of a narrow urethra. She denies any abdominal pain.  Patient rates her pain as sharp and a 10 out of 10.There are no other associated or modifying factors.  Past Medical History  Diagnosis Date  . Kidney stones   . Hypertension     Past Surgical History  Procedure Date  . Lithotripsy   . Tubal ligation     History reviewed. No pertinent family history.  History  Substance Use Topics  . Smoking status: Current Everyday Smoker  . Smokeless tobacco: Never Used  . Alcohol Use: No    OB History    Grav Para Term Preterm Abortions TAB SAB Ect Mult Living                  Review of Systems  Constitutional: Negative.   HENT: Negative.   Eyes: Negative.   Respiratory: Negative.   Cardiovascular: Negative.   Gastrointestinal: Positive for nausea.  Genitourinary: Positive for flank  pain.  Musculoskeletal: Negative.   Skin: Negative.   Neurological: Negative.   Hematological: Negative.   Psychiatric/Behavioral: Negative.   All other systems reviewed and are negative.    Allergies  Flagyl; Ketorolac tromethamine; Metoclopramide hcl; and Percocet  Home Medications   Current Outpatient Rx  Name Route Sig Dispense Refill  . METOPROLOL TARTRATE 25 MG PO TABS Oral Take 1 tablet (25 mg total) by mouth daily. 30 tablet 0  . ONDANSETRON 4 MG PO TBDP Oral Take 4 mg by mouth every 8 (eight) hours as needed. Patient used this medication for nausea.      BP 193/123  Pulse 108  Temp(Src) 99.3 F (37.4 C) (Oral)  Resp 22  SpO2 100%  LMP 03/20/2012  Physical Exam  Nursing note and vitals reviewed. GEN: Well-developed, well-nourished female pacing the room and very uncomfortable HEENT: Atraumatic, normocephalic. Oropharynx clear without erythema EYES: PERRLA BL, no scleral icterus. NECK: Trachea midline, no meningismus CV: Mildly tachycardic with regular rhythm. No murmurs, rubs, or gallops PULM: No respiratory distress.  No crackles, wheezes, or rales. GI: soft, non-tender. No guarding, rebound, or tenderness. + bowel sounds  GU: Right CVA tenderness Neuro: cranial nerves 2-12 intact, no abnormalities of strength or sensation, A and O x 3 MSK: Patient moves all 4 extremities symmetrically, no deformity, edema, or injury noted Skin: No rashes petechiae, purpura, or jaundice Psych: no abnormality of mood   ED Course  Procedures (including critical care  time)  Labs Reviewed  URINALYSIS, ROUTINE W REFLEX MICROSCOPIC - Abnormal; Notable for the following:    APPearance TURBID (*)    Hgb urine dipstick LARGE (*)    Protein, ur >300 (*)    Nitrite POSITIVE (*)    Leukocytes, UA SMALL (*)    All other components within normal limits  URINE MICROSCOPIC-ADD ON - Abnormal; Notable for the following:    Squamous Epithelial / LPF FEW (*)    Bacteria, UA MANY (*)     All other components within normal limits  BASIC METABOLIC PANEL - Abnormal; Notable for the following:    Glucose, Bld 122 (*)    GFR calc non Af Amer 82 (*)    All other components within normal limits  PREGNANCY, URINE  CBC  DIFFERENTIAL  URINE CULTURE   Ct Abdomen Pelvis Wo Contrast  03/26/2012  *RADIOLOGY REPORT*  Clinical Data: Right flank pain, history of kidney stones  CT ABDOMEN AND PELVIS WITHOUT CONTRAST  Technique:  Multidetector CT imaging of the abdomen and pelvis was performed following the standard protocol without intravenous contrast.  Comparison: 09/14/2011  Findings: Mild linear scarring versus atelectasis in the lingula and right middle lobe.  Liver, spleen, adrenal glands are within normal limits.  Gallbladder is unremarkable.  No intrahepatic or extrahepatic ductal dilatation.  Chronic right renal scarring with scattered renal calculi/parenchymal calcification.  Mild fullness of the right renal collecting system without frank hydronephrosis.  Left kidney is within normal limits.  No renal calculi or hydronephrosis.  No evidence of bowel obstruction.  Normal appendix.  No evidence of abdominal aortic aneurysm.  No abdominopelvic ascites.  No suspicious pelvic lymphadenopathy.  Uterus and bilateral ovaries are unremarkable.  Mild dilatation of the proximal right ureter with surrounding periureteral stranding (series 2/image 45).  No ureteral or bladder calculi.  Mild degenerative changes of the visualized thoracolumbar spine.  IMPRESSION: Mild dilatation of the proximal right ureter with surrounding periureteral stranding, raising the possibility of recent passage of a right renal/ureteral calculus.  Chronic renal scarring with scattered renal calculi/parenchymal calcifications.  Mild fullness of the right renal collecting system without frank hydronephrosis.  No ureteral or bladder calculi.  Original Report Authenticated By: Charline Bills, M.D.     1. UTI (urinary tract  infection)   2. Flank pain       MDM  Patient was evaluated by myself. Based on presentation patient had urinalysis and urine pregnancy performed. BMP and CBC were ordered. Patient received 2 mg of Dilaudid IM as she was difficult IV access. Urine pregnancy was negative. Urinalysis was concerning for urinary tract infection as well as significant hematuria. Records were requested from [facility] and repeat scan was performed given that patient feels much worse. A urine culture was sent. IV Rocephin was ordered given evidence of infection. Patient was feeling better after receiving pain medication and her blood pressure was improved to 150/100. Her home dose of metoprolol 12.5 mg by mouth was given.  1:23 AM Patient had no compromise of renal function. CT scans showed no signs of stones in the ureters though previous stones had been seen in the kidneys. CT from a previous [place] regional visit was obtained and reviewed by myself. This showed 1 nonobstructing 1 mm stone. This is no longer present on today's CAT scan. Patient will be discharged with 10 days of Keflex. She has pain medication at home but was given a prescription for Zofran. A copy of her CAT scan  was obtained for her to take to her followup urology appointment at [facility]. Patient was discharged in good condition. All vital signs have improved.        Cyndra Numbers, MD 03/26/12 [phone]

## 2012-03-26 LAB — CBC
Hemoglobin: 12.7 g/dL (ref 12.0–15.0)
MCH: 32.6 pg (ref 26.0–34.0)
MCHC: 34.9 g/dL (ref 30.0–36.0)
MCV: 93.3 fL (ref 78.0–100.0)
RBC: 3.9 MIL/uL (ref 3.87–5.11)

## 2012-03-26 LAB — BASIC METABOLIC PANEL
BUN: 9 mg/dL (ref 6–23)
Calcium: 8.8 mg/dL (ref 8.4–10.5)
Creatinine, Ser: 0.9 mg/dL (ref 0.50–1.10)
GFR calc Af Amer: >90 mL/min (ref >90)
GFR calc non Af Amer: 82 mL/min — ABNORMAL LOW (ref >90)
Glucose, Bld: 122 mg/dL — ABNORMAL HIGH (ref 70–99)
Potassium: 3.7 mEq/L (ref 3.5–5.1)

## 2012-03-26 LAB — DIFFERENTIAL
Basophils Relative: 0 % (ref 0–1)
Eosinophils Absolute: 0 10*3/uL (ref 0.0–0.7)
Eosinophils Relative: 0 % (ref 0–5)
Lymphs Abs: 1.9 10*3/uL (ref 0.7–4.0)
Monocytes Absolute: 0.6 10*3/uL (ref 0.1–1.0)
Monocytes Relative: 6 % (ref 3–12)
Neutrophils Relative %: 74 % (ref 43–77)

## 2012-03-26 MED ORDER — ONDANSETRON HCL 4 MG/2ML IJ SOLN
4.0000 mg | Freq: Once | INTRAMUSCULAR | Status: AC
  Administered 2012-03-26: 4 mg via INTRAVENOUS
  Filled 2012-03-26: qty 2

## 2012-03-26 MED ORDER — ONDANSETRON 8 MG PO TBDP: 8.0000 mg | ORAL_TABLET | Freq: Three times a day (TID) | ORAL | Status: AC | PRN

## 2012-03-26 MED ORDER — CEPHALEXIN 500 MG PO CAPS: 500.0000 mg | ORAL_CAPSULE | Freq: Four times a day (QID) | ORAL | Status: AC

## 2012-03-26 NOTE — Discharge Instructions (Signed)
Flank Pain  Flank pain refers to pain that is located on the side of the body between the upper abdomen and the back. It can be caused by many things.  CAUSES   Some of the more common causes of flank pain include:   Muscle strain.   Muscle spasms.   A disease of your spine (vertebral disk disease).   A lung infection (pneumonia).   Fluid around your lungs (pulmonary edema).   A kidney infection.   Kidney stones.   A very painful skin rash on only one side of your body (shingles).   Gallbladder disease.  DIAGNOSIS   Blood tests, urine tests, and X-rays may help your caregiver determine what is wrong.  TREATMENT   The treatment of pain depends on the cause. Your caregiver will determine what treatment will work best for you.  HOME CARE INSTRUCTIONS    Home care will depend on the cause of your pain.   Some medications may help relieve the pain. Take medication for relief of pain as directed by your caregiver.   Tell your caregiver about any changes in your pain.   Follow up with your caregiver.  SEEK IMMEDIATE MEDICAL CARE IF:    Your pain is not controlled with medication.   The pain increases.   You have abdominal pain.   You have shortness of breath.   You have persistent nausea or vomiting.   You have swelling in your abdomen.   You feel faint or pass out.   You have a temperature by mouth above 102 F (38.9 C), not controlled by medicine.  MAKE SURE YOU:    Understand these instructions.   Will watch your condition.   Will get help right away if you are not doing well or get worse.  Document Released: 11/26/2005 Document Revised: 09/24/2011 Document Reviewed: 03/22/2010  [employer] Patient Information 2012 [employer]  Urinary Tract Infection  Infections of the urinary tract can start in several places. A bladder infection (cystitis), a kidney infection (pyelonephritis), and a prostate infection (prostatitis) are different types of urinary tract infections (UTIs). They usually get  better if treated with medicines (antibiotics) that kill germs. Take all the medicine until it is gone. You or your child may feel better in a few days, but TAKE ALL MEDICINE or the infection may not respond and may become more difficult to treat.  HOME CARE INSTRUCTIONS    Drink enough water and fluids to keep the urine clear or pale yellow. Cranberry juice is especially recommended, in addition to large amounts of water.   Avoid caffeine, tea, and carbonated beverages. They tend to irritate the bladder.   Alcohol may irritate the prostate.   Only take over-the-counter or prescription medicines for pain, discomfort, or fever as directed by your caregiver.  To prevent further infections:   Empty the bladder often. Avoid holding urine for long periods of time.   After a bowel movement, women should cleanse from front to back. Use each tissue only once.   Empty the bladder before and after sexual intercourse.  FINDING OUT THE RESULTS OF YOUR TEST  Not all test results are available during your visit. If your or your child's test results are not back during the visit, make an appointment with your caregiver to find out the results. Do not assume everything is normal if you have not heard from your caregiver or the medical facility. It is important for you to follow up on all test results.    with a rectal temperature of 100.5 F (38.1 C) or higher for more than 1 day.   Your or your child's problems (symptoms) are no better in 3 days. Return sooner if you or your child is getting worse.  SEEK IMMEDIATE MEDICAL CARE IF:   There is severe back pain or lower abdominal pain.   You or your child develops chills.   You have a fever.   Your baby is older than 3 months with a rectal temperature of 102 F (38.9 C) or higher.   Your baby is 30 months old or younger with a rectal temperature of  100.4 F (38 C) or higher.   There is nausea or vomiting.   There is continued burning or discomfort with urination.  MAKE SURE YOU:   Understand these instructions.   Will watch your condition.   Will get help right away if you are not doing well or get worse.  Document Released: 07/15/2005 Document Revised: 09/24/2011 Document Reviewed: 02/17/2007 [facility] Patient Information 2012 [place], [place].Urinary Tract Infection Infections of the urinary tract can start in several places. A bladder infection (cystitis), a kidney infection (pyelonephritis), and a prostate infection (prostatitis) are different types of urinary tract infections (UTIs). They usually get better if treated with medicines (antibiotics) that kill germs. Take all the medicine until it is gone. You or your child may feel better in a few days, but TAKE ALL MEDICINE or the infection may not respond and may become more difficult to treat. HOME CARE INSTRUCTIONS   Drink enough water and fluids to keep the urine clear or pale yellow. Cranberry juice is especially recommended, in addition to large amounts of water.   Avoid caffeine, tea, and carbonated beverages. They tend to irritate the bladder.   Alcohol may irritate the prostate.   Only take over-the-counter or prescription medicines for pain, discomfort, or fever as directed by your caregiver.  To prevent further infections:  Empty the bladder often. Avoid holding urine for long periods of time.   After a bowel movement, women should cleanse from front to back. Use each tissue only once.   Empty the bladder before and after sexual intercourse.  FINDING OUT THE RESULTS OF YOUR TEST Not all test results are available during your visit. If your or your child's test results are not back during the visit, make an appointment with your caregiver to find out the results. Do not assume everything is normal if you have not heard from your caregiver or the medical facility. It  is important for you to follow up on all test results. SEEK MEDICAL CARE IF:   There is back pain.   Your baby is older than 3 months with a rectal temperature of 100.5 F (38.1 C) or higher for more than 1 day.   Your or your child's problems (symptoms) are no better in 3 days. Return sooner if you or your child is getting worse.  SEEK IMMEDIATE MEDICAL CARE IF:   There is severe back pain or lower abdominal pain.   You or your child develops chills.   You have a fever.   Your baby is older than 3 months with a rectal temperature of 102 F (38.9 C) or higher.   Your baby is 55 months old or younger with a rectal temperature of 100.4 F (38 C) or higher.   There is nausea or vomiting.   There is continued burning or discomfort with urination.  MAKE SURE YOU:   Understand  these instructions.   Will watch your condition.   Will get help right away if you are not doing well or get worse.  Document Released: 07/15/2005 Document Revised: 09/24/2011 Document Reviewed: 02/17/2007 [facility] Patient Information 2012 [place], [place].

## 2012-03-28 LAB — URINE CULTURE

## 2012-03-29 NOTE — ED Notes (Signed)
+   Urine Patient treated with keflex-sensitive to same-chart appended per protocol MD. 

## 2012-04-20 ENCOUNTER — Encounter (HOSPITAL_BASED_OUTPATIENT_CLINIC_OR_DEPARTMENT_OTHER): Payer: Self-pay

## 2012-04-20 ENCOUNTER — Inpatient Hospital Stay (HOSPITAL_BASED_OUTPATIENT_CLINIC_OR_DEPARTMENT_OTHER)
Admission: EM | Admit: 2012-04-20 | Discharge: 2012-04-22 | DRG: 690 | Disposition: A | Discharge status: Home / Self Care | Payer: Medicaid Other | Attending: Internal Medicine | Admitting: Internal Medicine

## 2012-04-20 ENCOUNTER — Inpatient Hospital Stay (HOSPITAL_COMMUNITY): Payer: Self-pay

## 2012-04-20 DIAGNOSIS — Z888 Allergy status to other drugs, medicaments and biological substances status: Secondary | ICD-10-CM

## 2012-04-20 DIAGNOSIS — L298 Other pruritus: Secondary | ICD-10-CM | POA: Diagnosis present

## 2012-04-20 DIAGNOSIS — L2989 Other pruritus: Secondary | ICD-10-CM | POA: Diagnosis present

## 2012-04-20 DIAGNOSIS — I10 Essential (primary) hypertension: Secondary | ICD-10-CM | POA: Diagnosis present

## 2012-04-20 DIAGNOSIS — R1110 Vomiting, unspecified: Secondary | ICD-10-CM | POA: Diagnosis present

## 2012-04-20 DIAGNOSIS — J45909 Unspecified asthma, uncomplicated: Secondary | ICD-10-CM | POA: Diagnosis present

## 2012-04-20 DIAGNOSIS — Z87442 Personal history of urinary calculi: Secondary | ICD-10-CM

## 2012-04-20 DIAGNOSIS — N12 Tubulo-interstitial nephritis, not specified as acute or chronic: Principal | ICD-10-CM | POA: Diagnosis present

## 2012-04-20 DIAGNOSIS — F17200 Nicotine dependence, unspecified, uncomplicated: Secondary | ICD-10-CM | POA: Diagnosis present

## 2012-04-20 DIAGNOSIS — Z87440 Personal history of urinary (tract) infections: Secondary | ICD-10-CM

## 2012-04-20 DIAGNOSIS — T4275XA Adverse effect of unspecified antiepileptic and sedative-hypnotic drugs, initial encounter: Secondary | ICD-10-CM | POA: Diagnosis present

## 2012-04-20 DIAGNOSIS — A498 Other bacterial infections of unspecified site: Secondary | ICD-10-CM | POA: Diagnosis present

## 2012-04-20 DIAGNOSIS — Z881 Allergy status to other antibiotic agents status: Secondary | ICD-10-CM

## 2012-04-20 HISTORY — DX: Unspecified asthma, uncomplicated: J45.909

## 2012-04-20 HISTORY — DX: Urinary tract infection, site not specified: N39.0

## 2012-04-20 HISTORY — DX: Other complications of anesthesia, initial encounter: T88.59XA

## 2012-04-20 HISTORY — DX: Adverse effect of unspecified anesthetic, initial encounter: T41.45XA

## 2012-04-20 HISTORY — DX: Chronic tubulo-interstitial nephritis, unspecified: N11.9

## 2012-04-20 LAB — URINALYSIS, ROUTINE W REFLEX MICROSCOPIC
Bilirubin Urine: NEGATIVE
Glucose, UA: NEGATIVE mg/dL
Ketones, ur: NEGATIVE mg/dL
Protein, ur: 30 mg/dL — AB
Urobilinogen, UA: 1 mg/dL (ref 0.0–1.0)

## 2012-04-20 LAB — BASIC METABOLIC PANEL
CO2: 27 mEq/L (ref 19–32)
Calcium: 8.6 mg/dL (ref 8.4–10.5)
Creatinine, Ser: 0.9 mg/dL (ref 0.50–1.10)
GFR calc non Af Amer: 82 mL/min — ABNORMAL LOW (ref >90)
Glucose, Bld: 111 mg/dL — ABNORMAL HIGH (ref 70–99)
Sodium: 138 mEq/L (ref 135–145)

## 2012-04-20 LAB — CBC WITH DIFFERENTIAL/PLATELET
Basophils Absolute: 0 10*3/uL (ref 0.0–0.1)
Eosinophils Absolute: 0.1 10*3/uL (ref 0.0–0.7)
Eosinophils Relative: 1 % (ref 0–5)
HCT: 32.1 % — ABNORMAL LOW (ref 36.0–46.0)
Lymphocytes Relative: 26 % (ref 12–46)
Lymphs Abs: 1.5 10*3/uL (ref 0.7–4.0)
MCH: 31.7 pg (ref 26.0–34.0)
MCV: 94.1 fL (ref 78.0–100.0)
Monocytes Absolute: 0.5 10*3/uL (ref 0.1–1.0)
Platelets: 255 10*3/uL (ref 150–400)
RDW: 11.9 % (ref 11.5–15.5)
WBC: 5.7 10*3/uL (ref 4.0–10.5)

## 2012-04-20 LAB — CBC
HCT: 33.7 % — ABNORMAL LOW (ref 36.0–46.0)
Platelets: 248 10*3/uL (ref 150–400)
RBC: 3.51 MIL/uL — ABNORMAL LOW (ref 3.87–5.11)
RDW: 12.9 % (ref 11.5–15.5)
WBC: 5.9 10*3/uL (ref 4.0–10.5)

## 2012-04-20 LAB — URINE MICROSCOPIC-ADD ON

## 2012-04-20 LAB — CREATININE, SERUM
Creatinine, Ser: 0.88 mg/dL (ref 0.50–1.10)
GFR calc non Af Amer: 85 mL/min — ABNORMAL LOW (ref >90)

## 2012-04-20 MED ORDER — DIPHENHYDRAMINE HCL 50 MG/ML IJ SOLN
INTRAMUSCULAR | Status: AC
  Filled 2012-04-20: qty 1

## 2012-04-20 MED ORDER — FLEET ENEMA 7-19 GM/118ML RE ENEM
1.0000 | ENEMA | Freq: Once | RECTAL | Status: AC | PRN
  Filled 2012-04-20: qty 1

## 2012-04-20 MED ORDER — PROMETHAZINE HCL 25 MG/ML IJ SOLN
INTRAMUSCULAR | Status: AC
  Administered 2012-04-20: 25 mg via INTRAVENOUS
  Filled 2012-04-20: qty 1

## 2012-04-20 MED ORDER — DIPHENHYDRAMINE HCL 50 MG/ML IJ SOLN
12.5000 mg | Freq: Three times a day (TID) | INTRAMUSCULAR | Status: DC | PRN
  Administered 2012-04-20 – 2012-04-21 (×2): 12.5 mg via INTRAVENOUS
  Filled 2012-04-20 (×2): qty 1

## 2012-04-20 MED ORDER — ONDANSETRON HCL 4 MG/2ML IJ SOLN: 4.0000 mg | Freq: Three times a day (TID) | INTRAMUSCULAR | Status: DC | PRN

## 2012-04-20 MED ORDER — CIPROFLOXACIN IN D5W 400 MG/200ML IV SOLN
400.0000 mg | Freq: Once | INTRAVENOUS | Status: AC
  Administered 2012-04-20: 400 mg via INTRAVENOUS
  Filled 2012-04-20: qty 200

## 2012-04-20 MED ORDER — DEXTROSE 5 % IV SOLN
1.0000 g | INTRAVENOUS | Status: DC
  Administered 2012-04-20 – 2012-04-21 (×2): 1 g via INTRAVENOUS
  Filled 2012-04-20 (×3): qty 10

## 2012-04-20 MED ORDER — METOPROLOL TARTRATE 12.5 MG HALF TABLET
12.5000 mg | ORAL_TABLET | Freq: Two times a day (BID) | ORAL | Status: DC
  Administered 2012-04-20 – 2012-04-22 (×4): 12.5 mg via ORAL
  Filled 2012-04-20 (×5): qty 1

## 2012-04-20 MED ORDER — PROMETHAZINE HCL 25 MG/ML IJ SOLN
25.0000 mg | Freq: Once | INTRAMUSCULAR | Status: AC
  Administered 2012-04-20: 25 mg via INTRAVENOUS

## 2012-04-20 MED ORDER — POLYETHYLENE GLYCOL 3350 17 G PO PACK
17.0000 g | PACK | Freq: Every day | ORAL | Status: DC
  Administered 2012-04-20 – 2012-04-22 (×3): 17 g via ORAL
  Filled 2012-04-20 (×3): qty 1

## 2012-04-20 MED ORDER — SODIUM CHLORIDE 0.9 % IV SOLN: INTRAVENOUS | Status: DC

## 2012-04-20 MED ORDER — HYDROMORPHONE HCL PF 1 MG/ML IJ SOLN
1.0000 mg | INTRAMUSCULAR | Status: DC | PRN
  Administered 2012-04-20 – 2012-04-21 (×5): 1 mg via INTRAVENOUS
  Filled 2012-04-20 (×5): qty 1

## 2012-04-20 MED ORDER — ALBUTEROL SULFATE (5 MG/ML) 0.5% IN NEBU: 2.5000 mg | INHALATION_SOLUTION | RESPIRATORY_TRACT | Status: DC | PRN

## 2012-04-20 MED ORDER — ONDANSETRON HCL 4 MG/2ML IJ SOLN
4.0000 mg | Freq: Once | INTRAMUSCULAR | Status: AC
  Administered 2012-04-20: 4 mg via INTRAVENOUS
  Filled 2012-04-20: qty 2

## 2012-04-20 MED ORDER — ONDANSETRON HCL 4 MG/2ML IJ SOLN
4.0000 mg | Freq: Four times a day (QID) | INTRAMUSCULAR | Status: DC | PRN
  Administered 2012-04-20 – 2012-04-21 (×4): 4 mg via INTRAVENOUS
  Filled 2012-04-20 (×4): qty 2

## 2012-04-20 MED ORDER — HYDROCODONE-ACETAMINOPHEN 5-325 MG PO TABS
1.0000 | ORAL_TABLET | ORAL | Status: DC | PRN
  Administered 2012-04-21 – 2012-04-22 (×3): 2 via ORAL
  Filled 2012-04-20 (×3): qty 2

## 2012-04-20 MED ORDER — ALUM & MAG HYDROXIDE-SIMETH 200-200-20 MG/5ML PO SUSP: 30.0000 mL | Freq: Four times a day (QID) | ORAL | Status: DC | PRN

## 2012-04-20 MED ORDER — HYDROMORPHONE HCL PF 1 MG/ML IJ SOLN
INTRAMUSCULAR | Status: AC
  Filled 2012-04-20: qty 1

## 2012-04-20 MED ORDER — SODIUM CHLORIDE 0.9 % IV SOLN
INTRAVENOUS | Status: DC
  Administered 2012-04-20 – 2012-04-22 (×2): via INTRAVENOUS

## 2012-04-20 MED ORDER — ENOXAPARIN SODIUM 40 MG/0.4ML ~~LOC~~ SOLN
40.0000 mg | SUBCUTANEOUS | Status: DC
  Administered 2012-04-20 – 2012-04-21 (×2): 40 mg via SUBCUTANEOUS
  Filled 2012-04-20 (×3): qty 0.4

## 2012-04-20 MED ORDER — HYDROMORPHONE HCL PF 1 MG/ML IJ SOLN: 1.0000 mg | INTRAMUSCULAR | Status: DC | PRN

## 2012-04-20 MED ORDER — PNEUMOCOCCAL VAC POLYVALENT 25 MCG/0.5ML IJ INJ
0.5000 mL | INJECTION | INTRAMUSCULAR | Status: AC
  Administered 2012-04-21: 0.5 mL via INTRAMUSCULAR
  Filled 2012-04-20: qty 0.5

## 2012-04-20 MED ORDER — DIPHENHYDRAMINE HCL 50 MG/ML IJ SOLN
12.5000 mg | Freq: Once | INTRAMUSCULAR | Status: AC
  Administered 2012-04-20: 12.5 mg via INTRAVENOUS

## 2012-04-20 MED ORDER — BISACODYL 5 MG PO TBEC: 5.0000 mg | DELAYED_RELEASE_TABLET | Freq: Every day | ORAL | Status: DC | PRN

## 2012-04-20 MED ORDER — PROMETHAZINE HCL 25 MG/ML IJ SOLN
12.5000 mg | Freq: Four times a day (QID) | INTRAMUSCULAR | Status: DC | PRN
  Administered 2012-04-20 – 2012-04-22 (×4): 12.5 mg via INTRAVENOUS
  Filled 2012-04-20 (×4): qty 1

## 2012-04-20 MED ORDER — ACETAMINOPHEN 650 MG RE SUPP: 650.0000 mg | Freq: Four times a day (QID) | RECTAL | Status: DC | PRN

## 2012-04-20 MED ORDER — ACETAMINOPHEN 325 MG PO TABS: 650.0000 mg | ORAL_TABLET | Freq: Four times a day (QID) | ORAL | Status: DC | PRN

## 2012-04-20 MED ORDER — SENNA 8.6 MG PO TABS
1.0000 | ORAL_TABLET | Freq: Two times a day (BID) | ORAL | Status: DC
  Administered 2012-04-20 – 2012-04-22 (×4): 8.6 mg via ORAL
  Filled 2012-04-20 (×5): qty 1

## 2012-04-20 MED ORDER — HYDROMORPHONE HCL PF 1 MG/ML IJ SOLN
1.0000 mg | Freq: Once | INTRAMUSCULAR | Status: AC
  Administered 2012-04-20: 1 mg via INTRAVENOUS

## 2012-04-20 MED ORDER — ONDANSETRON HCL 4 MG PO TABS: 4.0000 mg | ORAL_TABLET | Freq: Four times a day (QID) | ORAL | Status: DC | PRN

## 2012-04-20 MED ORDER — HYDROMORPHONE HCL PF 1 MG/ML IJ SOLN
INTRAMUSCULAR | Status: AC
  Administered 2012-04-20: 1 mg via INTRAVENOUS
  Filled 2012-04-20: qty 1

## 2012-04-20 MED ORDER — HYDROMORPHONE HCL PF 1 MG/ML IJ SOLN
1.0000 mg | Freq: Once | INTRAMUSCULAR | Status: AC
  Administered 2012-04-20 (×2): 1 mg via INTRAVENOUS
  Filled 2012-04-20: qty 1

## 2012-04-20 NOTE — ED Notes (Signed)
Family at bedside. 

## 2012-04-20 NOTE — ED Provider Notes (Signed)
History     CSN: [id number]  Arrival date & time 04/20/12  0804   First MD Initiated Contact with Patient 04/20/12 [phone]      Chief Complaint  Patient presents with  . Flank Pain  . Urinary Tract Infection  . Emesis    (Consider location/radiation/quality/duration/timing/severity/associated sxs/prior treatment) HPI Pt with long history of kidney stones, chronic recurrent UTI, previously had nephrostomy reports several days of increasing back pain, fever, vomiting and general malaise. Denies any dysuria but has had a strong odor to her urine. Her pain is moderate, aching, no particular provoking or relieving factors.  She was seen for similar symptoms about 4 weeks ago. Had culture which was pan-sensitive E. Coli. She was seen by her urologist at [facility] in the meantime who states she has a 'twist' in her ureter which predisposes her to infection. She also had a CT scan at that visit which showed several small renal calculi, but no ureteral stones.   Past Medical History  Diagnosis Date  . Hypertension   . Kidney stones   . Pyelonephritis, chronic   . Chronic UTI     Past Surgical History  Procedure Date  . Lithotripsy   . Tubal ligation     No family history on file.  History  Substance Use Topics  . Smoking status: Current Everyday Smoker  . Smokeless tobacco: Never Used  . Alcohol Use: No    OB History    Grav Para Term Preterm Abortions TAB SAB Ect Mult Living                  Review of Systems All other systems reviewed and are negative except as noted in HPI.   Allergies  Flagyl; Ketorolac tromethamine; Metoclopramide hcl; and Percocet  Home Medications   Current Outpatient Rx  Name Route Sig Dispense Refill  . METOPROLOL TARTRATE 25 MG PO TABS Oral Take 12.5 mg by mouth daily.    Marland Kitchen ONDANSETRON 4 MG PO TBDP Oral Take 4 mg by mouth every 8 (eight) hours as needed. Patient used this medication for nausea.      BP 150/103  Pulse 86  Temp 98.4 F (36.9  C) (Oral)  Resp 20  Ht 5\' 4"  (1.626 m)  Wt 243 lb (110.224 kg)  BMI 41.71 kg/m2  SpO2 100%  LMP 04/07/2012  Physical Exam  Nursing note and vitals reviewed. Constitutional: She is oriented to person, place, and time. She appears well-developed and well-nourished.  HENT:  Head: Normocephalic and atraumatic.  Eyes: EOM are normal. Pupils are equal, round, and reactive to light.  Neck: Normal range of motion. Neck supple.  Cardiovascular: Normal rate, normal heart sounds and intact distal pulses.   Pulmonary/Chest: Effort normal and breath sounds normal.  Abdominal: Bowel sounds are normal. She exhibits no distension. There is no tenderness.  Genitourinary:       R CVA tenderness is mild  Musculoskeletal: Normal range of motion. She exhibits no edema and no tenderness.  Neurological: She is alert and oriented to person, place, and time. She has normal strength. No cranial nerve deficit or sensory deficit.  Skin: Skin is warm and dry. No rash noted.  Psychiatric: She has a normal mood and affect.    ED Course  Procedures (including critical care time)  Labs Reviewed  URINALYSIS, ROUTINE W REFLEX MICROSCOPIC - Abnormal; Notable for the following:    APPearance CLOUDY (*)     Hgb urine dipstick MODERATE (*)  Protein, ur 30 (*)     Leukocytes, UA LARGE (*)     All other components within normal limits  URINE MICROSCOPIC-ADD ON - Abnormal; Notable for the following:    Bacteria, UA MANY (*)     All other components within normal limits  PREGNANCY, URINE  CBC WITH DIFFERENTIAL  BASIC METABOLIC PANEL  URINE CULTURE   No results found.   No diagnosis found.    MDM  Pt is a difficult IV stick and blood draw. She has a recurrent UTI. Will give IVF and IV Cipro in the ED. Continue with symptomatic care. If unable to control symptoms may need admission. Advised to follow with her urologist at [facility] regarding prophylactic antibiotics.    2:06 PM Pt still having pain and  nausea. Not controlled with meds here. Discussed admission with Hospitalist at [facility] who will accept for transfer.      Charles B. Bernette Mayers, MD 04/20/12 1407

## 2012-04-20 NOTE — ED Notes (Signed)
Attempted IV access - unsuccessful.

## 2012-04-20 NOTE — ED Notes (Signed)
Patient ambulatory to the bathroom without assistance to obtain UA.

## 2012-04-20 NOTE — ED Notes (Signed)
Correction to previous entry.  Blood was not collected, Dr. Bernette Mayers informed.

## 2012-04-20 NOTE — H&P (Signed)
PATIENT DETAILS Name: [name] Age: 36 y.o. Sex: female Date of Birth: 1975/10/23 Admit Date: 04/20/2012 PCP:No primary provider on file.   CHIEF COMPLAINT:  Right flank pain and fever for the past 3 days  HPI: Patient is a 36 year old old African American female with a past medical history of chronic UTIs, chronic nephrolithiasis previously requiring nephrostomy and numerous stent placements presents to the hospital as a transfer from med center height 0.4 evaluation of the above noted complaints. Per patient she was in her usual state of health around 3 days ago when she started developing right-sided flank pain. She describes the pain as dull in nature, 10/10 in severity at its worst, with no radiation. She had some associated nausea and vomiting. There is no history of diarrhea. No particular aggravating or relieving factors. She claims she's had a fever up to 101F. She presented med center [facility], and was subsequently transferred to [facility] for further evaluation and treatment. She denies any headache, neck pain, chest pain, shortness of breath. Denies any diarrhea. She claims she has foul-smelling urine, but denies any frank hematuria.   ALLERGIES:   Allergies  Allergen Reactions  . Flagyl (Metronidazole Hcl) Hives    hives  . Ketorolac Tromethamine Hives  . Metoclopramide Hcl     Facial spasm  . Percocet (Oxycodone-Acetaminophen) Hives and Itching    PAST MEDICAL HISTORY: Past Medical History  Diagnosis Date  . Hypertension   . Kidney stones   . Pyelonephritis, chronic   . Chronic UTI     PAST SURGICAL HISTORY: Past Surgical History  Procedure Date  . Lithotripsy   . Tubal ligation     MEDICATIONS AT HOME: Prior to Admission medications   Medication Sig Start Date End Date Taking? Authorizing Provider  acetaminophen (TYLENOL) 500 MG tablet Take 1,000 mg by mouth every 6 (six) hours as needed. Headache ,cramps ,kidney pain   Yes Historical  Provider, MD  cephALEXin (KEFLEX) 500 MG capsule Take 500 mg by mouth 3 (three) times daily. Finished on 04-17-2012   Yes Historical Provider, MD  metoprolol tartrate (LOPRESSOR) 25 MG tablet Take 12.5 mg by mouth 2 (two) times daily.  05/13/11  Yes Geoffery Lyons, MD  ondansetron (ZOFRAN-ODT) 4 MG disintegrating tablet Take 4 mg by mouth every 8 (eight) hours as needed. Nausea and vomiting   Yes Historical Provider, MD    FAMILY HISTORY: No family history of coronary artery disease  SOCIAL HISTORY:  reports that she has been smoking.  She has never used smokeless tobacco. She reports that she does not drink alcohol or use illicit drugs.  REVIEW OF SYSTEMS:  Constitutional:   No  weight loss, night sweats,   fatigue.  HEENT:    No headaches, Difficulty swallowing,Tooth/dental problems,Sore throat,  No sneezing, itching, ear ache, nasal congestion, post nasal drip,   Cardio-vascular: No chest pain,  Orthopnea, PND, swelling in lower extremities, anasarca, dizziness, palpitations  GI:  No heartburn, indigestion, abdominal pain, nausea, vomiting, diarrhea, change in bowel habits, loss of appetite  Resp: No shortness of breath with exertion or at rest.  No excess mucus, no productive cough, No non-productive cough,  No coughing up of blood.No change in color of mucus.No wheezing.No chest wall deformity  Skin:  no rash or lesions.  GU:  no dysuria, change in color of urine, no urgency or frequency.    Musculoskeletal: No joint pain or swelling.  No decreased range of motion.  No back pain.  Psych: No change in mood or affect. No depression or anxiety.  No memory loss.   PHYSICAL EXAM: Blood pressure 113/94, pulse 67, temperature 97.4 F (36.3 C), temperature source Oral, resp. rate 18, height 5\' 4"  (1.626 m), weight 110.224 kg (243 lb), last menstrual period 04/07/2012, SpO2 97.00%.  General appearance :Awake, alert, not in any distress. Speech Clear. Not toxic Looking HEENT:  Atraumatic and Normocephalic, pupils equally reactive to light and accomodation Neck: supple, no JVD. No cervical lymphadenopathy.  Chest:Good air entry bilaterally, no added sounds  CVS: S1 S2 regular, no murmurs.  Abdomen: Bowel sounds present, Non tender and not distended with no gaurding, rigidity or rebound. Mild to moderate right CVA tenderness Extremities: B/L Lower Ext shows no edema, both legs are warm to touch, with  dorsalis pedis pulses palpable. Neurology: Awake alert, and oriented X 3, CN II-XII intact, Non focal, Deep Tendon Reflex-2+ all over, plantar's downgoing B/L, sensory exam is grossly intact.  Skin:No Rash Wounds:N/A  LABS ON ADMISSION:   Basename 04/20/12 1230  NA 138  K 3.7  CL 102  CO2 27  GLUCOSE 111*  BUN 8  CREATININE 0.90  CALCIUM 8.6  MG --  PHOS --   No results found for this basename: AST:2,ALT:2,ALKPHOS:2,BILITOT:2,PROT:2,ALBUMIN:2 in the last 72 hours No results found for this basename: LIPASE:2,AMYLASE:2 in the last 72 hours  Basename 04/20/12 1230  WBC 5.7  NEUTROABS 3.7  HGB 10.8*  HCT 32.1*  MCV 94.1  PLT 255   No results found for this basename: CKTOTAL:3,CKMB:3,CKMBINDEX:3,TROPONINI:3 in the last 72 hours No results found for this basename: DDIMER:2 in the last 72 hours No components found with this basename: POCBNP:3   RADIOLOGIC STUDIES ON ADMISSION: Ct Abdomen Pelvis Wo Contrast  03/26/2012  *RADIOLOGY REPORT*  Clinical Data: Right flank pain, history of kidney stones  CT ABDOMEN AND PELVIS WITHOUT CONTRAST  Technique:  Multidetector CT imaging of the abdomen and pelvis was performed following the standard protocol without intravenous contrast.  Comparison: 09/14/2011  Findings: Mild linear scarring versus atelectasis in the lingula and right middle lobe.  Liver, spleen, adrenal glands are within normal limits.  Gallbladder is unremarkable.  No intrahepatic or extrahepatic ductal dilatation.  Chronic right renal scarring with  scattered renal calculi/parenchymal calcification.  Mild fullness of the right renal collecting system without frank hydronephrosis.  Left kidney is within normal limits.  No renal calculi or hydronephrosis.  No evidence of bowel obstruction.  Normal appendix.  No evidence of abdominal aortic aneurysm.  No abdominopelvic ascites.  No suspicious pelvic lymphadenopathy.  Uterus and bilateral ovaries are unremarkable.  Mild dilatation of the proximal right ureter with surrounding periureteral stranding (series 2/image 45).  No ureteral or bladder calculi.  Mild degenerative changes of the visualized thoracolumbar spine.  IMPRESSION: Mild dilatation of the proximal right ureter with surrounding periureteral stranding, raising the possibility of recent passage of a right renal/ureteral calculus.  Chronic renal scarring with scattered renal calculi/parenchymal calcifications.  Mild fullness of the right renal collecting system without frank hydronephrosis.  No ureteral or bladder calculi.  Original Report Authenticated By: Charline Bills, M.D.    ASSESSMENT AND PLAN: Present on Admission:  .Pyelonephritis-right-sided  -We'll hydrate with intravenous fluids, and empirically place on IV ceftriaxone and obtain urine culture  -Given the fact that she has had numerous stents and nephrostomy placement on the same side, I will obtain a CT scan of the abdomen to better assess the anatomy and make sure she does not  have hydronephrosis.  -We'll also get blood cultures if temperature is more than 101  -Depending on her clinical course, we may need to consult urology   .HTN (hypertension) -Continue with Lopressor   .Asthma -Currently lungs are clear -As needed albuterol nebulizer  Further plan will depend as patient's clinical course evolves and further radiologic and laboratory data become available. Patient will be monitored closely.  DVT Prophylaxis: Prophylactic Lovenox  Code Status: Full Code  Total  time spent for admission equals 45 minutes.  Jeoffrey Massed 04/20/2012, 4:20 PM

## 2012-04-20 NOTE — ED Notes (Signed)
Pt reports generalized itching that started after receiving Dilaudid.  She states she always has itching after receiving medication and takes benadryl.  No hives present.

## 2012-04-20 NOTE — ED Notes (Signed)
Unable to collect labs, Dr. Renae Gloss informed and new orders received.

## 2012-04-20 NOTE — ED Notes (Signed)
MD at bedside. 

## 2012-04-20 NOTE — Progress Notes (Signed)
Pt admitted to the unit at 1530. Pt is alert and oriented. Pt oriented to room, staff, and call bell. Bed in lowest position. Skin is intact. Full assessment charted in [facility]. Call bell with in reach. Low fall risk.

## 2012-04-20 NOTE — ED Notes (Signed)
Pt reports right flank pain and urinary symptoms that started 3 days ago.  She has vomited several times since yesterday.

## 2012-04-21 DIAGNOSIS — R1110 Vomiting, unspecified: Secondary | ICD-10-CM | POA: Diagnosis present

## 2012-04-21 LAB — COMPREHENSIVE METABOLIC PANEL
ALT: 11 U/L (ref 0–35)
AST: 12 U/L (ref 0–37)
CO2: 24 mEq/L (ref 19–32)
Chloride: 104 mEq/L (ref 96–112)
GFR calc non Af Amer: 79 mL/min — ABNORMAL LOW (ref >90)
Sodium: 140 mEq/L (ref 135–145)
Total Bilirubin: 0.3 mg/dL (ref 0.3–1.2)

## 2012-04-21 LAB — CBC
Platelets: 264 10*3/uL (ref 150–400)
RBC: 3.66 MIL/uL — ABNORMAL LOW (ref 3.87–5.11)
WBC: 5.4 10*3/uL (ref 4.0–10.5)

## 2012-04-21 LAB — PROTIME-INR: INR: 1.16 (ref 0.00–1.49)

## 2012-04-21 MED ORDER — SODIUM CHLORIDE 0.9 % IJ SOLN
10.0000 mL | INTRAMUSCULAR | Status: DC | PRN
  Administered 2012-04-22: 10 mL

## 2012-04-21 NOTE — Progress Notes (Signed)
Peripherally Inserted Central Catheter/Midline Placement  The IV Nurse has discussed with the patient and/or persons authorized to consent for the patient, the purpose of this procedure and the potential benefits and risks involved with this procedure.  The benefits include less needle sticks, lab draws from the catheter and patient may be discharged home with the catheter.  Risks include, but not limited to, infection, bleeding, blood clot (thrombus formation), and puncture of an artery; nerve damage and irregular heat beat.  Alternatives to this procedure were also discussed.  PICC/Midline Placement Documentation  PICC / Midline Single Lumen 04/21/12 PICC Left Brachial (Active)       [name], [name] 04/21/2012, 10:50 PM

## 2012-04-21 NOTE — Progress Notes (Signed)
PATIENT DETAILS Name: [name] Age: 36 y.o. Sex: female Date of Birth: 29-Apr-1976 Admit Date: 04/20/2012 PCP:No primary provider on file.  Subjective: Still having Right Flank pain Vomited twice overnight Afebrile however Itching-likely from narcotics  Objective: Vital signs in last 24 hours: Filed Vitals:   04/20/12 1424 04/20/12 1600 04/20/12 2042 04/21/12 0436  BP: 113/94 130/80 109/80 111/78  Pulse: 67 60 71 67  Temp: 97.4 F (36.3 C) 97.6 F (36.4 C) 98.2 F (36.8 C) 97.6 F (36.4 C)  TempSrc: Oral Oral Oral Oral  Resp:  14 16 18   Height:  5\' 4"  (1.626 m)    Weight:  109.5 kg (241 lb 6.5 oz)    SpO2: 97% 95% 96% 99%    Weight change:   Body mass index is 41.44 kg/(m^2).  Intake/Output from previous day:  Intake/Output Summary (Last 24 hours) at 04/21/12 0917 Last data filed at 04/21/12 0438  Gross per 24 hour  Intake    240 ml  Output      0 ml  Net    240 ml    PHYSICAL EXAM: Gen Exam: Awake and alert with clear speech.   Neck: Supple, No JVD.   Chest: B/L Clear.   CVS: S1 S2 Regular, no murmurs.  Abdomen: soft, BS +, non tender, non distended. Mild to moderate Right CVA tenderness Extremities: no edema, lower extremities warm to touch. Neurologic: Non Focal.   Skin: No Rash.   Wounds: N/A.    CONSULTS:  None  LAB RESULTS: CBC  Lab 04/21/12 0615 04/20/12 2203 04/20/12 1230  WBC 5.4 5.9 5.7  HGB 11.6* 11.3* 10.8*  HCT 35.2* 33.7* 32.1*  PLT 264 248 255  MCV 96.2 96.0 94.1  MCH 31.7 32.2 31.7  MCHC 33.0 33.5 33.6  RDW 12.7 12.9 11.9  LYMPHSABS -- -- 1.5  MONOABS -- -- 0.5  EOSABS -- -- 0.1  BASOSABS -- -- 0.0  BANDABS -- -- --    Chemistries   Lab 04/21/12 0615 04/20/12 1913 04/20/12 1230  NA 140 -- 138  K 3.6 -- 3.7  CL 104 -- 102  CO2 24 -- 27  GLUCOSE 110* -- 111*  BUN 6 -- 8  CREATININE 0.93 0.88 0.90  CALCIUM 8.6 -- 8.6  MG -- -- --    CBG: No results found for this basename: GLUCAP:5 in the last 168  hours  GFR Estimated Creatinine Clearance: 103.1 ml/min (by C-G formula based on Cr of 0.93).  Coagulation profile  Lab 04/21/12 0615  INR 1.16  PROTIME --    Cardiac Enzymes No results found for this basename: CK:3,CKMB:3,TROPONINI:3,MYOGLOBIN:3 in the last 168 hours  No components found with this basename: POCBNP:3 No results found for this basename: DDIMER:2 in the last 72 hours No results found for this basename: HGBA1C:2 in the last 72 hours No results found for this basename: CHOL:2,HDL:2,LDLCALC:2,TRIG:2,CHOLHDL:2,LDLDIRECT:2 in the last 72 hours No results found for this basename: TSH,T4TOTAL,FREET3,T3FREE,THYROIDAB in the last 72 hours No results found for this basename: VITAMINB12:2,FOLATE:2,FERRITIN:2,TIBC:2,IRON:2,RETICCTPCT:2 in the last 72 hours No results found for this basename: LIPASE:2,AMYLASE:2 in the last 72 hours  Urine Studies No results found for this basename: UACOL:2,UAPR:2,USPG:2,UPH:2,UTP:2,UGL:2,UKET:2,UBIL:2,UHGB:2,UNIT:2,UROB:2,ULEU:2,UEPI:2,UWBC:2,URBC:2,UBAC:2,CAST:2,CRYS:2,UCOM:2,BILUA:2 in the last 72 hours  MICROBIOLOGY: No results found for this or any previous visit (from the past 240 hour(s)).  RADIOLOGY STUDIES/RESULTS: Ct Abdomen Pelvis Wo Contrast  04/20/2012  *RADIOLOGY REPORT*  Clinical Data: Left-sided flank pain.  Recent UTI.  Fever. Evaluate for hydronephrosis/pyelonephritis.  CT ABDOMEN AND PELVIS  WITHOUT CONTRAST  Technique:  Multidetector CT imaging of the abdomen and pelvis was performed following the standard protocol without intravenous contrast.  Comparison: Multiple prior exams most recent  03/26/2012.  Findings: Previously noted mild dilation of proximal right ureter has cleared in the interim.  The right kidney is markedly abnormal scarred with low density structures and calcifications majority which have been noted on several prior exams.  No findings of left sided hydronephrosis, left renal calculi or ureteral calculi.   Evaluation for pyelonephritis is limited by unenhanced CT.  No extraluminal bowel inflammatory process, free fluid or free air.  Remainder of unenhanced CT without significant change since prior exam as previously described.  IMPRESSION: Interval clearing of the proximal right ureteral dilation.  No findings of left sided hydronephrosis, left renal calculi or ureteral calculi.  Evaluation for pyelonephritis is limited by unenhanced CT.  Original Report Authenticated By: Fuller Canada, M.D.   Ct Abdomen Pelvis Wo Contrast  03/26/2012  *RADIOLOGY REPORT*  Clinical Data: Right flank pain, history of kidney stones  CT ABDOMEN AND PELVIS WITHOUT CONTRAST  Technique:  Multidetector CT imaging of the abdomen and pelvis was performed following the standard protocol without intravenous contrast.  Comparison: 09/14/2011  Findings: Mild linear scarring versus atelectasis in the lingula and right middle lobe.  Liver, spleen, adrenal glands are within normal limits.  Gallbladder is unremarkable.  No intrahepatic or extrahepatic ductal dilatation.  Chronic right renal scarring with scattered renal calculi/parenchymal calcification.  Mild fullness of the right renal collecting system without frank hydronephrosis.  Left kidney is within normal limits.  No renal calculi or hydronephrosis.  No evidence of bowel obstruction.  Normal appendix.  No evidence of abdominal aortic aneurysm.  No abdominopelvic ascites.  No suspicious pelvic lymphadenopathy.  Uterus and bilateral ovaries are unremarkable.  Mild dilatation of the proximal right ureter with surrounding periureteral stranding (series 2/image 45).  No ureteral or bladder calculi.  Mild degenerative changes of the visualized thoracolumbar spine.  IMPRESSION: Mild dilatation of the proximal right ureter with surrounding periureteral stranding, raising the possibility of recent passage of a right renal/ureteral calculus.  Chronic renal scarring with scattered renal  calculi/parenchymal calcifications.  Mild fullness of the right renal collecting system without frank hydronephrosis.  No ureteral or bladder calculi.  Original Report Authenticated By: Charline Bills, M.D.    MEDICATIONS: Scheduled Meds:   . cefTRIAXone (ROCEPHIN)  IV  1 g Intravenous Q24H  . ciprofloxacin  400 mg Intravenous Once  . diphenhydrAMINE  12.5 mg Intravenous Once  . enoxaparin  40 mg Subcutaneous Q24H  .  HYDROmorphone (DILAUDID) injection  1 mg Intravenous Once  .  HYDROmorphone (DILAUDID) injection  1 mg Intravenous Once  . metoprolol tartrate  12.5 mg Oral BID  . ondansetron  4 mg Intravenous Once  . pneumococcal 23 valent vaccine  0.5 mL Intramuscular Tomorrow-1000  . polyethylene glycol  17 g Oral Daily  . promethazine  25 mg Intravenous Once  . senna  1 tablet Oral BID  . DISCONTD: sodium chloride   Intravenous STAT   Continuous Infusions:   . sodium chloride 100 mL/hr at 04/20/12 1708   PRN Meds:.acetaminophen, acetaminophen, albuterol, alum & mag hydroxide-simeth, bisacodyl, diphenhydrAMINE, HYDROcodone-acetaminophen, HYDROmorphone (DILAUDID) injection, ondansetron (ZOFRAN) IV, ondansetron, promethazine, sodium phosphate, DISCONTD:  HYDROmorphone (DILAUDID) injection, DISCONTD: ondansetron (ZOFRAN) IV  Antibiotics: Anti-infectives     Start     Dose/Rate Route Frequency Ordered Stop   04/20/12 1900   cefTRIAXone (ROCEPHIN) 1 g  in dextrose 5 % 50 mL IVPB        1 g 100 mL/hr over 30 Minutes Intravenous Every 24 hours 04/20/12 1626     04/20/12 0900   ciprofloxacin (CIPRO) IVPB 400 mg        400 mg 200 mL/hr over 60 Minutes Intravenous  Once 04/20/12 4098 04/20/12 1023          Assessment/Plan: Principal Problem:  *Pyelonephritis-Right -Continue with Rocephin-started 7/3 -is afebrile with no Leukocytosis -awaiting Urine Cultures -CT Abdomen-reassuring-no acute issues-improvement of Right ureteral dilatation seen last month on CT -minimize  narcotics-especially IV dilaudid-try Norco first  Vomiting -?etiology -either related to IV Dilaudid or from pyelonephritis -no major findings on CT abdomen to account for this -Minimize IV narcotics and treat underlying pyelonephritis-and follow clinical course -change to full liquid diet  HTN -controlled with Lopressor  Asthma -stable -as needed nebulized albuterol  Disposition: Remain inpatient-mobilize patient  DVT Prophylaxis: Prophylactic Lovenox  Code Status: Full Code  Jeoffrey Massed, MD  [facility] Pager [phone]  If 7PM-7AM, please contact night-coverage [email or web address] Password [email or web address] 04/21/2012, 9:17 AM   LOS: 1 day

## 2012-04-22 LAB — DIFFERENTIAL
Lymphs Abs: 2.8 10*3/uL (ref 0.7–4.0)
Monocytes Relative: 7 % (ref 3–12)
Neutro Abs: 1.5 10*3/uL — ABNORMAL LOW (ref 1.7–7.7)
Neutrophils Relative %: 31 % — ABNORMAL LOW (ref 43–77)

## 2012-04-22 LAB — BASIC METABOLIC PANEL
BUN: 7 mg/dL (ref 6–23)
Chloride: 103 mEq/L (ref 96–112)
Glucose, Bld: 94 mg/dL (ref 70–99)
Potassium: 3.4 mEq/L — ABNORMAL LOW (ref 3.5–5.1)

## 2012-04-22 LAB — CBC
Hemoglobin: 11 g/dL — ABNORMAL LOW (ref 12.0–15.0)
MCH: 31.7 pg (ref 26.0–34.0)
RBC: 3.47 MIL/uL — ABNORMAL LOW (ref 3.87–5.11)

## 2012-04-22 MED ORDER — CEFUROXIME AXETIL 500 MG PO TABS: 500.0000 mg | ORAL_TABLET | Freq: Two times a day (BID) | ORAL | Status: AC

## 2012-04-22 MED ORDER — POTASSIUM CHLORIDE CRYS ER 20 MEQ PO TBCR
EXTENDED_RELEASE_TABLET | ORAL | Status: AC
  Filled 2012-04-22: qty 2

## 2012-04-22 MED ORDER — HYDROCODONE-ACETAMINOPHEN 5-325 MG PO TABS: 1.0000 | ORAL_TABLET | Freq: Three times a day (TID) | ORAL | Status: AC | PRN

## 2012-04-22 MED ORDER — POTASSIUM CHLORIDE CRYS ER 20 MEQ PO TBCR
40.0000 meq | EXTENDED_RELEASE_TABLET | Freq: Once | ORAL | Status: AC
  Administered 2012-04-22: 40 meq via ORAL

## 2012-04-22 MED ORDER — ONDANSETRON 4 MG PO TBDP: 4.0000 mg | ORAL_TABLET | Freq: Three times a day (TID) | ORAL | Status: DC | PRN

## 2012-04-22 NOTE — Discharge Summary (Signed)
PATIENT DETAILS Name: [name] Age: 36 y.o. Sex: female Date of Birth: 05-29-1976 MRN: [id number]. Admit Date: 04/20/2012 Admitting Physician: Lorane Gell, MD PCP:No primary provider on file.  PRIMARY DISCHARGE DIAGNOSIS:  Principal Problem:  *Pyelonephritis Active Problems:  HTN (hypertension)  Asthma  Vomiting      PAST MEDICAL HISTORY: Past Medical History  Diagnosis Date  . Hypertension   . Chronic UTI   . Complication of anesthesia   . Asthma   . Kidney stones   . Pyelonephritis, chronic     DISCHARGE MEDICATIONS: Medication List  As of 04/22/2012 10:35 AM   STOP taking these medications         cephALEXin 500 MG capsule         TAKE these medications         acetaminophen 500 MG tablet   Commonly known as: TYLENOL   Take 1,000 mg by mouth every 6 (six) hours as needed. Headache ,cramps ,kidney pain      cefUROXime 500 MG tablet   Commonly known as: CEFTIN   Take 1 tablet (500 mg total) by mouth 2 (two) times daily.      HYDROcodone-acetaminophen 5-325 MG per tablet   Commonly known as: NORCO   Take 1-2 tablets by mouth every 8 (eight) hours as needed.      metoprolol tartrate 25 MG tablet   Commonly known as: LOPRESSOR   Take 12.5 mg by mouth 2 (two) times daily.      ondansetron 4 MG disintegrating tablet   Commonly known as: ZOFRAN-ODT   Take 4 mg by mouth every 8 (eight) hours as needed. Nausea and vomiting             BRIEF HPI:  See H&P, Labs, Consult and Test reports for all details in brief, patient was admitted for Right sided flank pain and fever (undocumented). She does have a history of having right sided renal stones.   CONSULTATIONS:   None  PERTINENT RADIOLOGIC STUDIES: Ct Abdomen Pelvis Wo Contrast  04/20/2012  *RADIOLOGY REPORT*  Clinical Data: Left-sided flank pain.  Recent UTI.  Fever. Evaluate for hydronephrosis/pyelonephritis.  CT ABDOMEN AND PELVIS WITHOUT CONTRAST  Technique:  Multidetector CT imaging of the  abdomen and pelvis was performed following the standard protocol without intravenous contrast.  Comparison: Multiple prior exams most recent  03/26/2012.  Findings: Previously noted mild dilation of proximal right ureter has cleared in the interim.  The right kidney is markedly abnormal scarred with low density structures and calcifications majority which have been noted on several prior exams.  No findings of left sided hydronephrosis, left renal calculi or ureteral calculi.  Evaluation for pyelonephritis is limited by unenhanced CT.  No extraluminal bowel inflammatory process, free fluid or free air.  Remainder of unenhanced CT without significant change since prior exam as previously described.  IMPRESSION: Interval clearing of the proximal right ureteral dilation.  No findings of left sided hydronephrosis, left renal calculi or ureteral calculi.  Evaluation for pyelonephritis is limited by unenhanced CT.  Original Report Authenticated By: Fuller Canada, M.D.   Ct Abdomen Pelvis Wo Contrast  03/26/2012  *RADIOLOGY REPORT*  Clinical Data: Right flank pain, history of kidney stones  CT ABDOMEN AND PELVIS WITHOUT CONTRAST  Technique:  Multidetector CT imaging of the abdomen and pelvis was performed following the standard protocol without intravenous contrast.  Comparison: 09/14/2011  Findings: Mild linear scarring versus atelectasis in the lingula and right middle lobe.  Liver,  spleen, adrenal glands are within normal limits.  Gallbladder is unremarkable.  No intrahepatic or extrahepatic ductal dilatation.  Chronic right renal scarring with scattered renal calculi/parenchymal calcification.  Mild fullness of the right renal collecting system without frank hydronephrosis.  Left kidney is within normal limits.  No renal calculi or hydronephrosis.  No evidence of bowel obstruction.  Normal appendix.  No evidence of abdominal aortic aneurysm.  No abdominopelvic ascites.  No suspicious pelvic lymphadenopathy.  Uterus  and bilateral ovaries are unremarkable.  Mild dilatation of the proximal right ureter with surrounding periureteral stranding (series 2/image 45).  No ureteral or bladder calculi.  Mild degenerative changes of the visualized thoracolumbar spine.  IMPRESSION: Mild dilatation of the proximal right ureter with surrounding periureteral stranding, raising the possibility of recent passage of a right renal/ureteral calculus.  Chronic renal scarring with scattered renal calculi/parenchymal calcifications.  Mild fullness of the right renal collecting system without frank hydronephrosis.  No ureteral or bladder calculi.  Original Report Authenticated By: Charline Bills, M.D.     PERTINENT LAB RESULTS: CBC:  Basename 04/22/12 0610 04/21/12 0615  WBC 4.7 5.4  HGB 11.0* 11.6*  HCT 33.2* 35.2*  PLT 282 264   CMET CMP     Component Value Date/Time   NA 140 04/22/2012 0610   K 3.4* 04/22/2012 0610   CL 103 04/22/2012 0610   CO2 26 04/22/2012 0610   GLUCOSE 94 04/22/2012 0610   BUN 7 04/22/2012 0610   CREATININE 0.92 04/22/2012 0610   CALCIUM 8.7 04/22/2012 0610   PROT 7.1 04/21/2012 0615   ALBUMIN 3.6 04/21/2012 0615   AST 12 04/21/2012 0615   ALT 11 04/21/2012 0615   ALKPHOS 68 04/21/2012 0615   BILITOT 0.3 04/21/2012 0615   GFRNONAA 80* 04/22/2012 0610   GFRAA >90 04/22/2012 0610    GFR Estimated Creatinine Clearance: 104.2 ml/min (by C-G formula based on Cr of 0.92). No results found for this basename: LIPASE:2,AMYLASE:2 in the last 72 hours No results found for this basename: CKTOTAL:3,CKMB:3,CKMBINDEX:3,TROPONINI:3 in the last 72 hours No components found with this basename: POCBNP:3 No results found for this basename: DDIMER:2 in the last 72 hours No results found for this basename: HGBA1C:2 in the last 72 hours No results found for this basename: CHOL:2,HDL:2,LDLCALC:2,TRIG:2,CHOLHDL:2,LDLDIRECT:2 in the last 72 hours No results found for this basename: TSH,T4TOTAL,FREET3,T3FREE,THYROIDAB in the last 72  hours No results found for this basename: VITAMINB12:2,FOLATE:2,FERRITIN:2,TIBC:2,IRON:2,RETICCTPCT:2 in the last 72 hours Coags:  Basename 04/21/12 0615  INR 1.16   Microbiology: Recent Results (from the past 240 hour(s))  URINE CULTURE     Status: Normal (Preliminary result)   Collection Time   04/20/12  8:36 AM      Component Value Range Status Comment   Specimen Description URINE, CLEAN CATCH   Final    Special Requests NONE   Final    Culture  Setup Time 04/21/2012 01:26   Final    Colony Count >=100,000 COLONIES/ML   Final    Culture ESCHERICHIA COLI   Final    Report Status PENDING   Incomplete      BRIEF HOSPITAL COURSE:   Principal Problem:  *Pyelonephritis-Right -Patient was admitted, started on IVF and IV Rocephin. She did not have fever during her hospital stay, nor did she have leukocytosis. Urine cultures was obtained, E Coli growing. Sensitivities are currently pending. E Coli-also was growing in urine culture in June 2013, and that was pan-sensitive. So we will discharge her on Ceftin-as she has  responded to IV Rocephin. -She did have Right CVA tenderness on exam, given her complicated h/o Right renal calculi requiring Lithotripsy/Nephrostomy-a CT Abdomen was obtained, which did not show any significant changes.   HTN (hypertension) -c/w Lopressor  TODAY-DAY OF DISCHARGE:  Subjective:   Lenoir Facchini today has no headache,no chest abdominal pain,no new weakness tingling or numbness, feels much better wants to go home today.   Objective:   Blood pressure 100/67, pulse 60, temperature 97.9 F (36.6 C), temperature source Oral, resp. rate 20, height 5\' 4"  (1.626 m), weight 109.5 kg (241 lb 6.5 oz), last menstrual period 04/07/2012, SpO2 96.00%.  Intake/Output Summary (Last 24 hours) at 04/22/12 1035 Last data filed at 04/22/12 0600  Gross per 24 hour  Intake 4014.67 ml  Output    300 ml  Net 3714.67 ml    Exam Awake Alert, Oriented *3, No new F.N  deficits, Normal affect [place].AT,PERRAL Supple Neck,No JVD, No cervical lymphadenopathy appriciated.  Symmetrical Chest wall movement, Good air movement bilaterally, CTAB RRR,No Gallops,Rubs or new Murmurs, No Parasternal Heave +ve B.Sounds, Abd Soft, Non tender, No organomegaly appriciated, No rebound -guarding or rigidity. No Cyanosis, Clubbing or edema, No new Rash or bruise  DISPOSITION: Home  DISCHARGE INSTRUCTIONS:    Follow-up Information    Follow up with [facility]. Schedule an appointment as soon as possible for a visit in 1 week.   Contact information:   [address]  [place], [place] [address] [phone]         Total Time spent on discharge equals 45 minutes.  SignedJeoffrey Massed 04/22/2012 10:35 AM

## 2012-04-22 NOTE — Progress Notes (Signed)
PATIENT DETAILS Name: [name] Age: 36 y.o. Sex: female Date of Birth: 15-May-1976 Admit Date: 04/20/2012 PCP:No primary provider on file.  Subjective: Right Flank pain resolved Tolerating diet now No further vomiting Per RN-on 7/4 patient witnessed eating "burger" (while on full liquid diet-after she had vomited)  Objective: Vital signs in last 24 hours: Filed Vitals:   04/21/12 2140 04/21/12 2252 04/21/12 2253 04/22/12 0542  BP: 91/54 129/90 129/90 100/67  Pulse: 58 63 63 60  Temp: 98.8 F (37.1 C)  98.3 F (36.8 C) 97.9 F (36.6 C)  TempSrc: Oral  Oral Oral  Resp: 18  20 20   Height:      Weight:      SpO2: 99%  100% 96%    Weight change:   Body mass index is 41.44 kg/(m^2).  Intake/Output from previous day:  Intake/Output Summary (Last 24 hours) at 04/22/12 1021 Last data filed at 04/22/12 0600  Gross per 24 hour  Intake 4014.67 ml  Output    300 ml  Net 3714.67 ml    PHYSICAL EXAM: Gen Exam: Awake and alert with clear speech.   Neck: Supple, No JVD.   Chest: B/L Clear.  No rales or rhonchi CVS: S1 S2 Regular, no murmurs.  Abdomen: soft, BS +, non tender, non distended.Right CVA tenderness has resolved today. Extremities: no edema, lower extremities warm to touch. Neurologic: Non Focal.   Skin: No Rash.   Wounds: N/A.    CONSULTS:  None  LAB RESULTS: CBC  Lab 04/22/12 0610 04/21/12 0615 04/20/12 2203 04/20/12 1230  WBC 4.7 5.4 5.9 5.7  HGB 11.0* 11.6* 11.3* 10.8*  HCT 33.2* 35.2* 33.7* 32.1*  PLT 282 264 248 255  MCV 95.7 96.2 96.0 94.1  MCH 31.7 31.7 32.2 31.7  MCHC 33.1 33.0 33.5 33.6  RDW 12.5 12.7 12.9 11.9  LYMPHSABS 2.8 -- -- 1.5  MONOABS 0.3 -- -- 0.5  EOSABS 0.1 -- -- 0.1  BASOSABS 0.0 -- -- 0.0  BANDABS -- -- -- --    Chemistries   Lab 04/22/12 0610 04/21/12 0615 04/20/12 1913 04/20/12 1230  NA 140 140 -- 138  K 3.4* 3.6 -- 3.7  CL 103 104 -- 102  CO2 26 24 -- 27  GLUCOSE 94 110* -- 111*  BUN 7 6 -- 8  CREATININE  0.92 0.93 0.88 0.90  CALCIUM 8.7 8.6 -- 8.6  MG -- -- -- --    CBG: No results found for this basename: GLUCAP:5 in the last 168 hours  GFR Estimated Creatinine Clearance: 104.2 ml/min (by C-G formula based on Cr of 0.92).  Coagulation profile  Lab 04/21/12 0615  INR 1.16  PROTIME --    Cardiac Enzymes No results found for this basename: CK:3,CKMB:3,TROPONINI:3,MYOGLOBIN:3 in the last 168 hours  No components found with this basename: POCBNP:3 No results found for this basename: DDIMER:2 in the last 72 hours No results found for this basename: HGBA1C:2 in the last 72 hours No results found for this basename: CHOL:2,HDL:2,LDLCALC:2,TRIG:2,CHOLHDL:2,LDLDIRECT:2 in the last 72 hours No results found for this basename: TSH,T4TOTAL,FREET3,T3FREE,THYROIDAB in the last 72 hours No results found for this basename: VITAMINB12:2,FOLATE:2,FERRITIN:2,TIBC:2,IRON:2,RETICCTPCT:2 in the last 72 hours No results found for this basename: LIPASE:2,AMYLASE:2 in the last 72 hours  Urine Studies No results found for this basename: UACOL:2,UAPR:2,USPG:2,UPH:2,UTP:2,UGL:2,UKET:2,UBIL:2,UHGB:2,UNIT:2,UROB:2,ULEU:2,UEPI:2,UWBC:2,URBC:2,UBAC:2,CAST:2,CRYS:2,UCOM:2,BILUA:2 in the last 72 hours  MICROBIOLOGY: Recent Results (from the past 240 hour(s))  URINE CULTURE     Status: Normal (Preliminary result)   Collection Time   04/20/12  8:36 AM      Component Value Range Status Comment   Specimen Description URINE, CLEAN CATCH   Final    Special Requests NONE   Final    Culture  Setup Time 04/21/2012 01:26   Final    Colony Count >=100,000 COLONIES/ML   Final    Culture ESCHERICHIA COLI   Final    Report Status PENDING   Incomplete     RADIOLOGY STUDIES/RESULTS: Ct Abdomen Pelvis Wo Contrast  04/20/2012  *RADIOLOGY REPORT*  Clinical Data: Left-sided flank pain.  Recent UTI.  Fever. Evaluate for hydronephrosis/pyelonephritis.  CT ABDOMEN AND PELVIS WITHOUT CONTRAST  Technique:  Multidetector CT imaging  of the abdomen and pelvis was performed following the standard protocol without intravenous contrast.  Comparison: Multiple prior exams most recent  03/26/2012.  Findings: Previously noted mild dilation of proximal right ureter has cleared in the interim.  The right kidney is markedly abnormal scarred with low density structures and calcifications majority which have been noted on several prior exams.  No findings of left sided hydronephrosis, left renal calculi or ureteral calculi.  Evaluation for pyelonephritis is limited by unenhanced CT.  No extraluminal bowel inflammatory process, free fluid or free air.  Remainder of unenhanced CT without significant change since prior exam as previously described.  IMPRESSION: Interval clearing of the proximal right ureteral dilation.  No findings of left sided hydronephrosis, left renal calculi or ureteral calculi.  Evaluation for pyelonephritis is limited by unenhanced CT.  Original Report Authenticated By: Fuller Canada, M.D.   Ct Abdomen Pelvis Wo Contrast  03/26/2012  *RADIOLOGY REPORT*  Clinical Data: Right flank pain, history of kidney stones  CT ABDOMEN AND PELVIS WITHOUT CONTRAST  Technique:  Multidetector CT imaging of the abdomen and pelvis was performed following the standard protocol without intravenous contrast.  Comparison: 09/14/2011  Findings: Mild linear scarring versus atelectasis in the lingula and right middle lobe.  Liver, spleen, adrenal glands are within normal limits.  Gallbladder is unremarkable.  No intrahepatic or extrahepatic ductal dilatation.  Chronic right renal scarring with scattered renal calculi/parenchymal calcification.  Mild fullness of the right renal collecting system without frank hydronephrosis.  Left kidney is within normal limits.  No renal calculi or hydronephrosis.  No evidence of bowel obstruction.  Normal appendix.  No evidence of abdominal aortic aneurysm.  No abdominopelvic ascites.  No suspicious pelvic lymphadenopathy.   Uterus and bilateral ovaries are unremarkable.  Mild dilatation of the proximal right ureter with surrounding periureteral stranding (series 2/image 45).  No ureteral or bladder calculi.  Mild degenerative changes of the visualized thoracolumbar spine.  IMPRESSION: Mild dilatation of the proximal right ureter with surrounding periureteral stranding, raising the possibility of recent passage of a right renal/ureteral calculus.  Chronic renal scarring with scattered renal calculi/parenchymal calcifications.  Mild fullness of the right renal collecting system without frank hydronephrosis.  No ureteral or bladder calculi.  Original Report Authenticated By: Charline Bills, M.D.    MEDICATIONS: Scheduled Meds:    . cefTRIAXone (ROCEPHIN)  IV  1 g Intravenous Q24H  . enoxaparin  40 mg Subcutaneous Q24H  . metoprolol tartrate  12.5 mg Oral BID  . polyethylene glycol  17 g Oral Daily  . potassium chloride SA      . potassium chloride  40 mEq Oral Once  . senna  1 tablet Oral BID   Continuous Infusions:    . sodium chloride 100 mL/hr at 04/21/12 2100   PRN Meds:.acetaminophen, acetaminophen, albuterol, alum &  mag hydroxide-simeth, bisacodyl, diphenhydrAMINE, HYDROcodone-acetaminophen, HYDROmorphone (DILAUDID) injection, ondansetron (ZOFRAN) IV, ondansetron, promethazine, sodium chloride  Antibiotics: Anti-infectives     Start     Dose/Rate Route Frequency Ordered Stop   04/20/12 1900   cefTRIAXone (ROCEPHIN) 1 g in dextrose 5 % 50 mL IVPB        1 g 100 mL/hr over 30 Minutes Intravenous Every 24 hours 04/20/12 1626     04/20/12 0900   ciprofloxacin (CIPRO) IVPB 400 mg        400 mg 200 mL/hr over 60 Minutes Intravenous  Once 04/20/12 0852 04/20/12 1023          Assessment/Plan: Principal Problem:  *Pyelonephritis-Right -Continue with Rocephin-started 7/3 -is afebrile with no Leukocytosis -Urine Culture-showing E Coli-sensitivities pending -CT Abdomen-reassuring-no acute  issues-improvement of Right ureteral dilatation seen last month on CT -minimize narcotics-especially IV dilaudid-try Norco first  Vomiting -?etiology-?related to narcotics -this has resolved - HTN -controlled with Lopressor  Asthma -stable -as needed nebulized albuterol  Disposition: Home today  DVT Prophylaxis: Prophylactic Lovenox  Code Status: Full Code  Jeoffrey Massed, MD  [facility] Pager [phone]  If 7PM-7AM, please contact night-coverage [email or web address] Password [email or web address] 04/22/2012, 10:21 AM   LOS: 2 days

## 2012-04-22 NOTE — Progress Notes (Signed)
Utilization Review Completed.Dejai Schubach T7/02/2012   

## 2012-04-22 NOTE — Progress Notes (Signed)
Richardo Priest to be D/C'd Home per MD order.  Discussed with the patient the After Visit Summary and all questions fully answered. All IV's discontinued with no bleeding noted. All belongings returned to patient for patient to take home. PICC line DC'd by IV team, no bleeding noted at time of discharge.   Susann Givens, RN, [facility] 04/22/2012 5:39 PM

## 2012-04-23 LAB — URINE CULTURE

## 2012-07-07 ENCOUNTER — Emergency Department (HOSPITAL_BASED_OUTPATIENT_CLINIC_OR_DEPARTMENT_OTHER)
Admission: EM | Admit: 2012-07-07 | Discharge: 2012-07-07 | Disposition: A | Discharge status: Home / Self Care | Payer: Self-pay | Attending: Emergency Medicine | Admitting: Emergency Medicine

## 2012-07-07 ENCOUNTER — Encounter (HOSPITAL_BASED_OUTPATIENT_CLINIC_OR_DEPARTMENT_OTHER): Payer: Self-pay | Admitting: *Deleted

## 2012-07-07 ENCOUNTER — Emergency Department (HOSPITAL_BASED_OUTPATIENT_CLINIC_OR_DEPARTMENT_OTHER): Payer: Self-pay

## 2012-07-07 DIAGNOSIS — R110 Nausea: Secondary | ICD-10-CM

## 2012-07-07 DIAGNOSIS — B9689 Other specified bacterial agents as the cause of diseases classified elsewhere: Secondary | ICD-10-CM

## 2012-07-07 DIAGNOSIS — N200 Calculus of kidney: Secondary | ICD-10-CM

## 2012-07-07 DIAGNOSIS — A499 Bacterial infection, unspecified: Secondary | ICD-10-CM | POA: Insufficient documentation

## 2012-07-07 DIAGNOSIS — Z79899 Other long term (current) drug therapy: Secondary | ICD-10-CM | POA: Insufficient documentation

## 2012-07-07 DIAGNOSIS — N760 Acute vaginitis: Secondary | ICD-10-CM | POA: Insufficient documentation

## 2012-07-07 HISTORY — DX: Candidiasis of vulva and vagina: B37.3

## 2012-07-07 HISTORY — DX: Acute candidiasis of vulva and vagina: B37.31

## 2012-07-07 HISTORY — DX: Other specified bacterial agents as the cause of diseases classified elsewhere: N76.0

## 2012-07-07 HISTORY — DX: Unspecified ovarian cyst, unspecified side: N83.209

## 2012-07-07 HISTORY — DX: Other specified bacterial agents as the cause of diseases classified elsewhere: B96.89

## 2012-07-07 LAB — WET PREP, GENITAL
Trich, Wet Prep: NONE SEEN
Yeast Wet Prep HPF POC: NONE SEEN

## 2012-07-07 LAB — URINALYSIS, ROUTINE W REFLEX MICROSCOPIC
Glucose, UA: NEGATIVE mg/dL
Hgb urine dipstick: NEGATIVE
Specific Gravity, Urine: 1.026 (ref 1.005–1.030)

## 2012-07-07 MED ORDER — PROMETHAZINE HCL 25 MG/ML IJ SOLN
INTRAMUSCULAR | Status: AC
  Administered 2012-07-07: 25 mg via INTRAMUSCULAR
  Filled 2012-07-07: qty 1

## 2012-07-07 MED ORDER — PROMETHAZINE HCL 25 MG/ML IJ SOLN
25.0000 mg | Freq: Once | INTRAMUSCULAR | Status: AC
  Administered 2012-07-07: 25 mg via INTRAMUSCULAR
  Filled 2012-07-07: qty 1

## 2012-07-07 MED ORDER — PROMETHAZINE HCL 25 MG/ML IJ SOLN
25.0000 mg | Freq: Once | INTRAMUSCULAR | Status: AC
  Administered 2012-07-07: 25 mg via INTRAMUSCULAR

## 2012-07-07 MED ORDER — CLINDAMYCIN HCL 150 MG PO CAPS: 300.0000 mg | ORAL_CAPSULE | Freq: Two times a day (BID) | ORAL | Status: DC

## 2012-07-07 MED ORDER — ONDANSETRON 8 MG PO TBDP
8.0000 mg | ORAL_TABLET | Freq: Once | ORAL | Status: AC
  Administered 2012-07-07: 8 mg via ORAL
  Filled 2012-07-07: qty 1

## 2012-07-07 MED ORDER — CEPHALEXIN 500 MG PO CAPS: 500.0000 mg | ORAL_CAPSULE | Freq: Two times a day (BID) | ORAL | Status: DC

## 2012-07-07 MED ORDER — HYDROMORPHONE HCL PF 1 MG/ML IJ SOLN
1.0000 mg | Freq: Once | INTRAMUSCULAR | Status: AC
  Administered 2012-07-07: 1 mg via INTRAMUSCULAR
  Filled 2012-07-07: qty 1

## 2012-07-07 MED ORDER — PROMETHAZINE HCL 25 MG PO TABS: 25.0000 mg | ORAL_TABLET | Freq: Four times a day (QID) | ORAL | Status: DC | PRN

## 2012-07-07 MED ORDER — HYDROCODONE-ACETAMINOPHEN 5-325 MG PO TABS: ORAL_TABLET | ORAL | Status: DC

## 2012-07-07 NOTE — ED Notes (Signed)
Patient transported to Ultrasound 

## 2012-07-07 NOTE — ED Notes (Signed)
Pt c/o lower abd  Pain with nausea x 1 week

## 2012-07-07 NOTE — ED Provider Notes (Signed)
History     CSN: [id number]  Arrival date & time 07/07/12  1434   First MD Initiated Contact with Patient 07/07/12 1507      Chief Complaint  Patient presents with  . Abdominal Pain  . 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  She just finished a normal menses, is sexually active, same partner, no birth control used, has had slight discharge with unusual odor.  No fevers, chills.  She denies urgency, freq.  No dysuria, hematuria, but appears somewhat cloudy.  She did have old keflex and so has been taking it, and had about 3-4 days of it.  She has had BV and yeast infection in the past.  No STD's in the past.  Her urologist is at [facility] and has routine appt in Dec.    Patient is a 36 y.o. female presenting with abdominal pain. The history is provided by the patient.  Abdominal Pain The primary symptoms of the illness include abdominal pain, nausea and vaginal discharge. The primary symptoms of the illness do not include fever, vomiting, diarrhea, dysuria or vaginal bleeding.  The vaginal discharge is not associated with dysuria.  Symptoms associated with the illness do not include chills, constipation, frequency or back pain.    Past Medical History  Diagnosis Date  . Hypertension   . Chronic UTI   . Complication of anesthesia   . Asthma   . Kidney stones   . Pyelonephritis, chronic   . Ovarian cyst   . Bacterial vaginosis   . Candidal vaginitis     Past Surgical History  Procedure Date  . Lithotripsy 2000's  . Cystoscopy 2001-2012    "total of 11; for chronic chronic pyelonephritis"  . Tubal ligation 2002    . Nephrostomy     History reviewed. No pertinent family history.  History  Substance Use Topics  . Smoking status: Current Every Day Smoker -- 0.2 packs/day for 18 years    Types: Cigarettes    Last Attempt to Quit: 04/19/2012  . Smokeless tobacco: Never Used  . Alcohol Use: No    OB History    Grav Para Term Preterm Abortions TAB SAB Ect Mult Living                  Review of Systems  Constitutional: Negative for fever and chills.  Gastrointestinal: Positive for nausea and abdominal pain. Negative for vomiting, diarrhea, constipation, anal bleeding and rectal pain.  Genitourinary: Positive for flank pain and vaginal discharge. Negative for dysuria, frequency, vaginal bleeding, difficulty urinating and vaginal pain.  Musculoskeletal: Negative for back pain.  Skin: Negative for rash and wound.  All other systems reviewed and are negative.    Allergies  Flagyl; Ketorolac tromethamine; Metoclopramide hcl; and Percocet  Home Medications   Current Outpatient Rx  Name Route Sig Dispense Refill  . ACETAMINOPHEN 500 MG PO TABS Oral Take 1,000 mg by mouth every 6 (six) hours as needed. Headache ,cramps ,kidney pain    . CEPHALEXIN 500 MG PO CAPS Oral Take 1 capsule (500 mg total) by mouth 2 (two) times daily. 6 capsule 0  . CLINDAMYCIN HCL 150 MG PO CAPS Oral Take 2  capsules (300 mg total) by mouth 2 (two) times daily. 28 capsule 0  . HYDROCODONE-ACETAMINOPHEN 5-325 MG PO TABS  1-2 tablets po q 6 hours prn moderate to severe pain 20 tablet 0  . METOPROLOL TARTRATE 25 MG PO TABS Oral Take 12.5 mg by mouth 2 (two) times daily.     Marland Kitchen ONDANSETRON 4 MG PO TBDP Oral Take 1 tablet (4 mg total) by mouth every 8 (eight) hours as needed. Nausea and vomiting 20 tablet 0  . PROMETHAZINE HCL 25 MG PO TABS Oral Take 1 tablet (25 mg total) by mouth every 6 (six) hours as needed for nausea. 20 tablet 0    BP 156/99  Pulse 71  Temp 98.1 F (36.7 C) (Oral)  Resp 16  Ht 5\' 4"  (1.626 m)  Wt  230 lb (104.327 kg)  BMI 39.48 kg/m2  SpO2 99%  LMP 07/04/2012  Physical Exam  Nursing note and vitals reviewed. Constitutional: She is oriented to person, place, and time. She appears well-developed and well-nourished.  HENT:  Head: Normocephalic and atraumatic.  Eyes: Pupils are equal, round, and reactive to light. No scleral icterus.  Neck: Normal range of motion. Neck supple.  Cardiovascular: Normal rate and regular rhythm.   Pulmonary/Chest: Effort normal. No respiratory distress. She has no wheezes.  Abdominal: Soft. Normal appearance and bowel sounds are normal. She exhibits no distension. There is no tenderness. There is CVA tenderness. There is no rebound and no guarding.    Genitourinary: Pelvic exam was performed with patient prone. Uterus is not tender. Cervix exhibits no motion tenderness and no discharge. Right adnexum displays tenderness. Right adnexum displays no mass and no fullness. Left adnexum displays no mass, no tenderness and no fullness. No erythema or tenderness around the vagina. No signs of injury around the vagina. Vaginal discharge found.       Minimal white discharge  Musculoskeletal: She exhibits no edema.  Neurological: She is alert and oriented to person, place, and time.  Skin: Skin is warm and dry.    ED Course  Procedures (including critical care time)  Labs Reviewed  URINALYSIS, ROUTINE W REFLEX MICROSCOPIC - Abnormal; Notable for the following:    APPearance CLOUDY (*)     All other components within normal limits  WET PREP, GENITAL - Abnormal; Notable for the following:    Clue Cells Wet Prep HPF POC MODERATE (*)     WBC, Wet Prep HPF POC FEW (*)     All other components within normal limits  PREGNANCY, URINE  GC/CHLAMYDIA PROBE AMP, GENITAL  URINE CULTURE   US Transvaginal Non-ob  07/07/2012  *RADIOLOGY REPORT*  Clinical Data:  Right lower quadrant pain, nausea/vomiting, evaluate for torsion  TRANSABDOMINAL AND TRANSVAGINAL ULTRASOUND OF  PELVIS DOPPLER ULTRASOUND OF OVARIES  Technique:  Both transabdominal and transvaginal ultrasound examinations of the pelvis were performed. Transabdominal technique was performed for global imaging of the pelvis including uterus, ovaries, adnexal regions, and pelvic cul-de-sac.  It was necessary to proceed with endovaginal exam following the transabdominal exam to visualize the endometrium.  Color and duplex Doppler ultrasound was utilized to evaluate blood flow to the ovaries.  Comparison:  None.  Findings:  Uterus:  Normal in size and appearance, measuring 8.2 x 3.8 x 4.7 cm.  Endometrium:  Normal in thickness and appearance, measuring 7 mm.  Right ovary: Normal appearance/no adnexal mass, measuring 1.8 x 4.1 x 2.4 cm, better visualized transabdominally.  Left ovary:   Normal appearance/no adnexal mass, measuring  1.8 x 4.0 x 4.6 cm, only visualized transabdominally.  Pulsed Doppler evaluation demonstrates normal low-resistance arterial and venous waveforms in both ovaries.  IMPRESSION: Normal exam.  No evidence of pelvic mass or other significant abnormality.  No sonographic evidence for ovarian torsion.   Original Report Authenticated By: Charline Bills, M.D.    US Pelvis Complete  07/07/2012  *RADIOLOGY REPORT*  Clinical Data:  Right lower quadrant pain, nausea/vomiting, evaluate for torsion  TRANSABDOMINAL AND TRANSVAGINAL ULTRASOUND OF PELVIS DOPPLER ULTRASOUND OF OVARIES  Technique:  Both transabdominal and transvaginal ultrasound examinations of the pelvis were performed. Transabdominal technique was performed for global imaging of the pelvis including uterus, ovaries, adnexal regions, and pelvic cul-de-sac.  It was necessary to proceed with endovaginal exam following the transabdominal exam to visualize the endometrium.  Color and duplex Doppler ultrasound was utilized to evaluate blood flow to the ovaries.  Comparison:  None.  Findings:  Uterus:  Normal in size and appearance, measuring 8.2 x 3.8 x  4.7 cm.  Endometrium:  Normal in thickness and appearance, measuring 7 mm.  Right ovary: Normal appearance/no adnexal mass, measuring 1.8 x 4.1 x 2.4 cm, better visualized transabdominally.  Left ovary:   Normal appearance/no adnexal mass, measuring 1.8 x 4.0 x 4.6 cm, only visualized transabdominally.  Pulsed Doppler evaluation demonstrates normal low-resistance arterial and venous waveforms in both ovaries.  IMPRESSION: Normal exam.  No evidence of pelvic mass or other significant abnormality.  No sonographic evidence for ovarian torsion.   Original Report Authenticated By: Charline Bills, M.D.    US Renal  07/07/2012  *RADIOLOGY REPORT*  Clinical Data: Right lower quadrant pain, history renal stones, lithotripsy, and ureteral stent, history of pyelonephritis  RENAL/URINARY TRACT ULTRASOUND COMPLETE  Comparison:  None.  Findings:  Right Kidney:  Measures 9.6 cm.  Lobulated right renal contour.  7 mm lower pole calculus.  No mass or hydronephrosis.  Left Kidney:  Measures 10.0 cm.  No mass or hydronephrosis.  Bladder:  Underdistended.  Bilateral bladder jets are visualized.  IMPRESSION: 7 mm right lower pole calculus.  No hydronephrosis.   Original Report Authenticated By: Charline Bills, M.D.    [place] Art/ven Flow Abd Pelv Doppler  07/07/2012  *RADIOLOGY REPORT*  Clinical Data:  Right lower quadrant pain, nausea/vomiting, evaluate for torsion  TRANSABDOMINAL AND TRANSVAGINAL ULTRASOUND OF PELVIS DOPPLER ULTRASOUND OF OVARIES  Technique:  Both transabdominal and transvaginal ultrasound examinations of the pelvis were performed. Transabdominal technique was performed for global imaging of the pelvis including uterus, ovaries, adnexal regions, and pelvic cul-de-sac.  It was necessary to proceed with endovaginal exam following the transabdominal exam to visualize the endometrium.  Color and duplex Doppler ultrasound was utilized to evaluate blood flow to the ovaries.  Comparison:  None.  Findings:  Uterus:   Normal in size and appearance, measuring 8.2 x 3.8 x 4.7 cm.  Endometrium:  Normal in thickness and appearance, measuring 7 mm.  Right ovary: Normal appearance/no adnexal mass, measuring 1.8 x 4.1 x 2.4 cm, better visualized transabdominally.  Left ovary:   Normal appearance/no adnexal mass, measuring 1.8 x 4.0 x 4.6 cm, only visualized transabdominally.  Pulsed Doppler evaluation demonstrates normal low-resistance arterial and venous waveforms in both ovaries.  IMPRESSION: Normal exam.  No evidence of pelvic mass or other significant abnormality.  No sonographic evidence for ovarian torsion.   Original Report Authenticated By: Charline Bills, M.D.      1. Bacterial vaginosis   2. Kidney stone   3. Nausea  RA sat is 99% and I interpret to be normal.  7:01 PM Pt received additional dose of nausea and pain, feels improved, no vomiting, pt is comfortable going home.  All results of labs, U/S discussed.  Urine culture and G/C add chl cultures sent adn pt told about process.    MDM  UA is cloudy, but no LE, nitrites, bcateria, WBC's.  Wet prep and GC/Chl sent.  Will get pelvic U/S as well as renal given her history.  No fevers.  Pt given IM meds and pt feels somewhat improved.          Gavin Pound. Oletta Lamas, MD 07/07/12 1902

## 2012-07-07 NOTE — Discharge Instructions (Signed)
Bacterial Vaginosis Bacterial vaginosis (BV) is a vaginal infection where the normal balance of bacteria in the vagina is disrupted. The normal balance is then replaced by an overgrowth of certain bacteria. There are several different kinds of bacteria that can cause BV. BV is the most common vaginal infection in women of childbearing age. CAUSES   The cause of BV is not fully understood. BV develops when there is an increase or imbalance of harmful bacteria.   Some activities or behaviors can upset the normal balance of bacteria in the vagina and put women at increased risk including:   Having a new sex partner or multiple sex partners.   Douching.   Using an intrauterine device (IUD) for contraception.   It is not clear what role sexual activity plays in the development of BV. However, women that have never had sexual intercourse are rarely infected with BV.  Women do not get BV from toilet seats, bedding, swimming pools or from touching objects around them.  SYMPTOMS   Grey vaginal discharge.   A fish-like odor with discharge, especially after sexual intercourse.   Itching or burning of the vagina and vulva.   Burning or pain with urination.   Some women have no signs or symptoms at all.  DIAGNOSIS  Your caregiver must examine the vagina for signs of BV. Your caregiver will perform lab tests and look at the sample of vaginal fluid through a microscope. They will look for bacteria and abnormal cells (clue cells), a pH test higher than 4.5, and a positive amine test all associated with BV.  RISKS AND COMPLICATIONS   Pelvic inflammatory disease (PID).   Infections following gynecology surgery.   Developing HIV.   Developing herpes virus.  TREATMENT  Sometimes BV will clear up without treatment. However, all women with symptoms of BV should be treated to avoid complications, especially if gynecology surgery is planned. Female partners generally do not need to be treated. However,  BV may spread between female sex partners so treatment is helpful in preventing a recurrence of BV.   BV may be treated with antibiotics. The antibiotics come in either pill or vaginal cream forms. Either can be used with nonpregnant or pregnant women, but the recommended dosages differ. These antibiotics are not harmful to the baby.   BV can recur after treatment. If this happens, a second round of antibiotics will often be prescribed.   Treatment is important for pregnant women. If not treated, BV can cause a premature delivery, especially for a pregnant woman who had a premature birth in the past. All pregnant women who have symptoms of BV should be checked and treated.   For chronic reoccurrence of BV, treatment with a type of prescribed gel vaginally twice a week is helpful.  HOME CARE INSTRUCTIONS   Finish all medication as directed by your caregiver.   Do not have sex until treatment is completed.   Tell your sexual partner that you have a vaginal infection. They should see their caregiver and be treated if they have problems, such as a mild rash or itching.   Practice safe sex. Use condoms. Only have 1 sex partner.  PREVENTION  Basic prevention steps can help reduce the risk of upsetting the natural balance of bacteria in the vagina and developing BV:  Do not have sexual intercourse (be abstinent).   Do not douche.   Use all of the medicine prescribed for treatment of BV, even if the signs and symptoms go away.     Tell your sex partner if you have BV. That way, they can be treated, if needed, to prevent reoccurrence.  SEEK MEDICAL CARE IF:   Your symptoms are not improving after 3 days of treatment.   You have increased discharge, pain, or fever.  MAKE SURE YOU:   Understand these instructions.   Will watch your condition.   Will get help right away if you are not doing well or get worse.  FOR MORE INFORMATION  Division of STD Prevention (DSTDP), Centers for Disease  Control and Prevention: www.cdc.gov/std American [employer] Association (ASHA): www.ashastd.org  Document Released: 10/05/2005 Document Revised: 09/24/2011 Document Reviewed: 03/28/2009 Exit[employer] Patient Information 2012 [employer].         Nausea and Vomiting Nausea is a sick feeling that often comes before throwing up (vomiting). Vomiting is a reflex where stomach contents come out of your mouth. Vomiting can cause severe loss of body fluids (dehydration). Children and elderly adults can become dehydrated quickly, especially if they also have diarrhea. Nausea and vomiting are symptoms of a condition or disease. It is important to find the cause of your symptoms. CAUSES   Direct irritation of the stomach lining. This irritation can result from increased acid production (gastroesophageal reflux disease), infection, food poisoning, taking certain medicines (such as nonsteroidal anti-inflammatory drugs), alcohol use, or tobacco use.   Signals from the brain.These signals could be caused by a headache, heat exposure, an inner ear disturbance, increased pressure in the brain from injury, infection, a tumor, or a concussion, pain, emotional stimulus, or metabolic problems.   An obstruction in the gastrointestinal tract (bowel obstruction).   Illnesses such as diabetes, hepatitis, gallbladder problems, appendicitis, kidney problems, cancer, sepsis, atypical symptoms of a heart attack, or eating disorders.   Medical treatments such as chemotherapy and radiation.   Receiving medicine that makes you sleep (general anesthetic) during surgery.  DIAGNOSIS Your caregiver may ask for tests to be done if the problems do not improve after a few days. Tests may also be done if symptoms are severe or if the reason for the nausea and vomiting is not clear. Tests may include:  Urine tests.   Blood tests.   Stool tests.   Cultures (to look for evidence of infection).   X-rays or other  imaging studies.  Test results can help your caregiver make decisions about treatment or the need for additional tests. TREATMENT You need to stay well hydrated. Drink frequently but in small amounts.You may wish to drink water, sports drinks, clear broth, or eat frozen ice pops or gelatin dessert to help stay hydrated.When you eat, eating slowly may help prevent nausea.There are also some antinausea medicines that may help prevent nausea. HOME CARE INSTRUCTIONS   Take all medicine as directed by your caregiver.   If you do not have an appetite, do not force yourself to eat. However, you must continue to drink fluids.   If you have an appetite, eat a normal diet unless your caregiver tells you differently.   Eat a variety of complex carbohydrates (rice, wheat, potatoes, bread), lean meats, yogurt, fruits, and vegetables.   Avoid high-fat foods because they are more difficult to digest.   Drink enough water and fluids to keep your urine clear or pale yellow.   If you are dehydrated, ask your caregiver for specific rehydration instructions. Signs of dehydration may include:   Severe thirst.   Dry lips and mouth.   Dizziness.   Dark urine.   Decreasing urine   frequency and amount.   Confusion.   Rapid breathing or pulse.  SEEK IMMEDIATE MEDICAL CARE IF:   You have blood or brown flecks (like coffee grounds) in your vomit.   You have black or bloody stools.   You have a severe headache or stiff neck.   You are confused.   You have severe abdominal pain.   You have chest pain or trouble breathing.   You do not urinate at least once every 8 hours.   You develop cold or clammy skin.   You continue to vomit for longer than 24 to 48 hours.   You have a fever.  MAKE SURE YOU:   Understand these instructions.   Will watch your condition.   Will get help right away if you are not doing well or get worse.  Document Released: 10/05/2005 Document Revised: 09/24/2011  Document Reviewed: 03/04/2011 Exit[employer] Patient Information 2012 [employer]. 

## 2012-07-08 LAB — GC/CHLAMYDIA PROBE AMP, GENITAL
Chlamydia, DNA Probe: NEGATIVE
GC Probe Amp, Genital: NEGATIVE

## 2012-07-09 LAB — URINE CULTURE
Colony Count: 5000
Special Requests: NORMAL

## 2012-07-26 ENCOUNTER — Emergency Department (HOSPITAL_BASED_OUTPATIENT_CLINIC_OR_DEPARTMENT_OTHER)
Admission: EM | Admit: 2012-07-26 | Discharge: 2012-07-26 | Disposition: A | Discharge status: Home / Self Care | Payer: Self-pay | Attending: Emergency Medicine | Admitting: Emergency Medicine

## 2012-07-26 ENCOUNTER — Encounter (HOSPITAL_BASED_OUTPATIENT_CLINIC_OR_DEPARTMENT_OTHER): Payer: Self-pay

## 2012-07-26 ENCOUNTER — Emergency Department (HOSPITAL_BASED_OUTPATIENT_CLINIC_OR_DEPARTMENT_OTHER): Payer: Self-pay

## 2012-07-26 DIAGNOSIS — N12 Tubulo-interstitial nephritis, not specified as acute or chronic: Secondary | ICD-10-CM | POA: Insufficient documentation

## 2012-07-26 DIAGNOSIS — N1330 Unspecified hydronephrosis: Secondary | ICD-10-CM | POA: Insufficient documentation

## 2012-07-26 DIAGNOSIS — R109 Unspecified abdominal pain: Secondary | ICD-10-CM | POA: Insufficient documentation

## 2012-07-26 DIAGNOSIS — Z87442 Personal history of urinary calculi: Secondary | ICD-10-CM | POA: Insufficient documentation

## 2012-07-26 DIAGNOSIS — I10 Essential (primary) hypertension: Secondary | ICD-10-CM | POA: Insufficient documentation

## 2012-07-26 LAB — CBC WITH DIFFERENTIAL/PLATELET
Basophils Relative: 0 % (ref 0–1)
Eosinophils Absolute: 0.1 10*3/uL (ref 0.0–0.7)
Eosinophils Relative: 1 % (ref 0–5)
HCT: 36.4 % (ref 36.0–46.0)
Hemoglobin: 12.5 g/dL (ref 12.0–15.0)
Lymphs Abs: 1.6 10*3/uL (ref 0.7–4.0)
MCH: 32.1 pg (ref 26.0–34.0)
MCHC: 34.3 g/dL (ref 30.0–36.0)
MCV: 93.6 fL (ref 78.0–100.0)
Monocytes Absolute: 0.7 10*3/uL (ref 0.1–1.0)
Monocytes Relative: 7 % (ref 3–12)
RBC: 3.89 MIL/uL (ref 3.87–5.11)

## 2012-07-26 LAB — PREGNANCY, URINE: Preg Test, Ur: NEGATIVE

## 2012-07-26 LAB — BASIC METABOLIC PANEL
BUN: 11 mg/dL (ref 6–23)
Calcium: 9.3 mg/dL (ref 8.4–10.5)
Creatinine, Ser: 0.9 mg/dL (ref 0.50–1.10)
GFR calc non Af Amer: 82 mL/min — ABNORMAL LOW (ref >90)
Glucose, Bld: 100 mg/dL — ABNORMAL HIGH (ref 70–99)
Potassium: 3.9 mEq/L (ref 3.5–5.1)

## 2012-07-26 LAB — URINALYSIS, ROUTINE W REFLEX MICROSCOPIC
Bilirubin Urine: NEGATIVE
Ketones, ur: NEGATIVE mg/dL
Nitrite: POSITIVE — AB
Urobilinogen, UA: 0.2 mg/dL (ref 0.0–1.0)
pH: 6 (ref 5.0–8.0)

## 2012-07-26 MED ORDER — LORAZEPAM 2 MG/ML IJ SOLN
1.0000 mg | Freq: Once | INTRAMUSCULAR | Status: AC
  Administered 2012-07-26: 1 mg via INTRAVENOUS
  Filled 2012-07-26: qty 1

## 2012-07-26 MED ORDER — PROMETHAZINE HCL 25 MG PO TABS: 25.0000 mg | ORAL_TABLET | Freq: Four times a day (QID) | ORAL | Status: DC | PRN

## 2012-07-26 MED ORDER — HYDROMORPHONE HCL PF 1 MG/ML IJ SOLN
1.0000 mg | Freq: Once | INTRAMUSCULAR | Status: AC
  Administered 2012-07-26: 1 mg via INTRAVENOUS
  Filled 2012-07-26: qty 1

## 2012-07-26 MED ORDER — ONDANSETRON HCL 4 MG/2ML IJ SOLN
4.0000 mg | Freq: Once | INTRAMUSCULAR | Status: AC
  Administered 2012-07-26: 4 mg via INTRAVENOUS

## 2012-07-26 MED ORDER — OXYCODONE-ACETAMINOPHEN 5-325 MG PO TABS: 2.0000 | ORAL_TABLET | ORAL | Status: DC | PRN

## 2012-07-26 MED ORDER — SULFAMETHOXAZOLE-TRIMETHOPRIM 800-160 MG PO TABS: 1.0000 | ORAL_TABLET | Freq: Two times a day (BID) | ORAL | Status: AC

## 2012-07-26 MED ORDER — DEXTROSE 5 % IV SOLN
1.0000 g | Freq: Once | INTRAVENOUS | Status: AC
  Administered 2012-07-26: 1 g via INTRAVENOUS
  Filled 2012-07-26: qty 10

## 2012-07-26 MED ORDER — ONDANSETRON HCL 4 MG/2ML IJ SOLN
INTRAMUSCULAR | Status: AC
  Administered 2012-07-26: 4 mg via INTRAVENOUS
  Filled 2012-07-26: qty 2

## 2012-07-26 MED ORDER — ONDANSETRON HCL 4 MG/2ML IJ SOLN
4.0000 mg | Freq: Once | INTRAMUSCULAR | Status: AC
  Administered 2012-07-26: 4 mg via INTRAVENOUS
  Filled 2012-07-26: qty 2

## 2012-07-26 MED ORDER — ONDANSETRON 4 MG PO TBDP
4.0000 mg | ORAL_TABLET | Freq: Once | ORAL | Status: AC
  Administered 2012-07-26: 4 mg via ORAL
  Filled 2012-07-26: qty 1

## 2012-07-26 MED ORDER — LORAZEPAM 1 MG PO TABS: 1.0000 mg | ORAL_TABLET | Freq: Four times a day (QID) | ORAL | Status: DC | PRN

## 2012-07-26 MED ORDER — SODIUM CHLORIDE 0.9 % IV BOLUS (SEPSIS)
1000.0000 mL | Freq: Once | INTRAVENOUS | Status: AC
  Administered 2012-07-26: 1000 mL via INTRAVENOUS

## 2012-07-26 NOTE — ED Provider Notes (Signed)
History     CSN: [id number]  Arrival date & time 07/26/12  1510   First MD Initiated Contact with Patient 07/26/12 1606      Chief Complaint  Patient presents with  . Abdominal Pain  . Flank Pain  . Urinary Frequency    (Consider location/radiation/quality/duration/timing/severity/associated sxs/prior treatment) HPI Comments: [name] 36 y.o. female   The chief complaint is: Patient presents with:   Abdominal Pain   Flank Pain   Urinary Frequency60 htn   36 year old female with a past medical history significant for over 14. Kidney stones. She is followed by urology at [facility]. She has a known 7 mm stone in the lower pole of the right kidney. For today. She was awoken from sleep approximately 5 AM with severe right flank pain radiating into her right lower quadrant. She states that she has nausea, but no vomiting. Her pain is rated at a 10 out of 10. She denies any chest pain or shortness of breath. She denies any gross hematuria, dysuria, frequency. She denies any vaginal symptoms. She also has history of chronic urinary tract infection and multiple episodes pyelonephritis. She denies, fevers, chills, myalgias, arthralgias. She denies abdominal distention, diarrhea, or constipation.    Patient is a 36 y.o. female presenting with abdominal pain, flank pain, and frequency. The history is provided by the patient. No language interpreter was used.  Abdominal Pain The primary symptoms of the illness include abdominal pain and nausea. The primary symptoms of the illness do not include fever, fatigue, shortness of breath, vomiting, diarrhea, hematemesis, hematochezia, dysuria, vaginal discharge or vaginal bleeding. The current episode started 6 to 12 hours ago. The onset of the illness was sudden. The problem has been rapidly worsening.  The abdominal pain began 6 to 12 hours ago. The pain came on suddenly. The abdominal pain has been rapidly worsening since its onset.  The abdominal pain is located in the right flank and suprapubic region. The abdominal pain radiates to the RLQ. The severity of the abdominal pain is 10/10. The abdominal pain is relieved by nothing.  Nausea began today.  Additional symptoms associated with the illness include frequency and back pain. Symptoms associated with the illness do not include constipation.  Flank Pain Associated symptoms include abdominal pain and nausea. Pertinent negatives include no arthralgias, chest pain, fatigue, fever, headaches, joint swelling, myalgias, rash or vomiting.  Urinary Frequency Associated symptoms include abdominal pain and nausea. Pertinent negatives include no arthralgias, chest pain, fatigue, fever, headaches, joint swelling, myalgias, rash or vomiting.    Past Medical History  Diagnosis Date  . Hypertension   . Chronic UTI   . Complication of anesthesia   . Asthma   . Kidney stones   . Pyelonephritis, chronic   . Ovarian cyst   . Bacterial vaginosis   . Candidal vaginitis     Past Surgical History  Procedure Date  . Lithotripsy 2000's  . Cystoscopy 2001-2012    "total of 11; for chronic chronic pyelonephritis"  . Tubal ligation 2002  . Nephrostomy     No family history on file.  History  Substance Use Topics  . Smoking status: Current Every Day Smoker -- 0.2 packs/day for 18 years    Types: Cigarettes    Last Attempt to Quit: 04/19/2012  . Smokeless tobacco: Never Used  . Alcohol Use: No    OB History    Grav Para Term Preterm Abortions TAB SAB Ect Mult Living  Review of Systems  Constitutional: Negative for fever and fatigue.  Respiratory: Negative for shortness of breath.   Cardiovascular: Negative for chest pain, palpitations and leg swelling.  Gastrointestinal: Positive for nausea and abdominal pain. Negative for vomiting, diarrhea, constipation, blood in stool, hematochezia, abdominal distention, anal bleeding, rectal pain and hematemesis.    Genitourinary: Positive for frequency and flank pain. Negative for dysuria, vaginal bleeding, vaginal discharge and pelvic pain.  Musculoskeletal: Positive for back pain. Negative for myalgias, joint swelling, arthralgias and gait problem.  Skin: Negative for rash.  Neurological: Negative for light-headedness and headaches.  Hematological: Negative.   Psychiatric/Behavioral: Negative.   All other systems reviewed and are negative.    Allergies  Flagyl; Ketorolac tromethamine; Metoclopramide hcl; and Percocet  Home Medications   Current Outpatient Rx  Name Route Sig Dispense Refill  . ACETAMINOPHEN 500 MG PO TABS Oral Take 1,000 mg by mouth every 6 (six) hours as needed. Headache ,cramps ,kidney pain    . CEPHALEXIN 500 MG PO CAPS Oral Take 1 capsule (500 mg total) by mouth 2 (two) times daily. 6 capsule 0  . CLINDAMYCIN HCL 150 MG PO CAPS Oral Take 2 capsules (300 mg total) by mouth 2 (two) times daily. 28 capsule 0  . HYDROCODONE-ACETAMINOPHEN 5-325 MG PO TABS  1-2 tablets po q 6 hours prn moderate to severe pain 20 tablet 0  . METOPROLOL TARTRATE 25 MG PO TABS Oral Take 12.5 mg by mouth 2 (two) times daily.     Marland Kitchen ONDANSETRON 4 MG PO TBDP Oral Take 1 tablet (4 mg total) by mouth every 8 (eight) hours as needed. Nausea and vomiting 20 tablet 0  . PROMETHAZINE HCL 25 MG PO TABS Oral Take 1 tablet (25 mg total) by mouth every 6 (six) hours as needed for nausea. 20 tablet 0    BP 180/123  Pulse 84  Temp 98.5 F (36.9 C) (Oral)  Resp 20  Ht 5\' 4"  (1.626 m)  Wt 228 lb (103.42 kg)  BMI 39.14 kg/m2  SpO2 98%  LMP 07/06/2012  Physical Exam  Nursing note and vitals reviewed. Constitutional: She is oriented to person, place, and time.       Obese female. She is pacing the room and appears extremely uncomfortable. She is on the verge of tears.Marland Kitchen  HENT:  Head: Normocephalic and atraumatic.  Eyes: Conjunctivae normal are normal. No scleral icterus.  Neck: Normal range of motion.   Cardiovascular: Normal rate, regular rhythm, normal heart sounds and intact distal pulses.  Exam reveals no gallop and no friction rub.   No murmur heard. Pulmonary/Chest: Effort normal and breath sounds normal. No respiratory distress. She has no wheezes. She exhibits no tenderness.  Abdominal: Soft. Bowel sounds are normal. She exhibits no distension and no mass. There is tenderness. There is CVA tenderness. There is no rebound and no guarding.  Musculoskeletal: Normal range of motion. She exhibits no edema and no tenderness.  Neurological: She is alert and oriented to person, place, and time.  Skin: Skin is warm and dry.    ED Course  Procedures (including critical care time)  Labs Reviewed  URINALYSIS, ROUTINE W REFLEX MICROSCOPIC - Abnormal; Notable for the following:    APPearance CLOUDY (*)     Hgb urine dipstick LARGE (*)     Protein, ur >300 (*)     Nitrite POSITIVE (*)     Leukocytes, UA MODERATE (*)     All other components within normal limits  URINE MICROSCOPIC-ADD  ON - Abnormal; Notable for the following:    Bacteria, UA MANY (*)     All other components within normal limits  CBC WITH DIFFERENTIAL - Abnormal; Notable for the following:    Neutro Abs 7.9 (*)     All other components within normal limits  PREGNANCY, URINE  BASIC METABOLIC PANEL   Ct Abdomen Pelvis Wo Contrast  07/26/2012  *RADIOLOGY REPORT*  Clinical Data: Right flank pain, nausea, urinary frequency, history of renal calculi  CT ABDOMEN AND PELVIS WITHOUT CONTRAST  Technique:  Multidetector CT imaging of the abdomen and pelvis was performed following the standard protocol without intravenous contrast.  Comparison: 04/20/2012  Findings: Minimal lingula atelectasis versus scarring.  Lower lobes otherwise clear.  Normal heart size.  No pericardial or pleural effusion.  No hiatal hernia.  Abdomen:  Chronic right renal parenchymal scarring with scattered calcifications.  Calcification distribution in the right  kidney is similar to the prior study.  However, right kidney demonstrates perinephric inflammation, mild hydronephrosis and associated hydroureter. Periureteral stranding also noted extending into the pelvis.  No obstructing right ureteral calculus is demonstrated. This appearance could be related to recent stone passage versus ascending right urinary tract infection (pyelonephritis).  Left kidney is stable in appearance without hydronephrosis, obstructive uropathy, ureteral dilatation, or obstructing ureteral calculus.  Liver, gallbladder, biliary system, pancreas, spleen, and adrenal glands are within normal limits for noncontrast study.  Negative for bowel obstruction, dilatation, ileus, or free air.  No abdominal fluid collection, hemorrhage, hematoma, adenopathy, or abscess.  Normal terminal ileum and appendix demonstrated in the right lower quadrant.  Pelvis:  No significant pelvic free fluid.  Small 19 mm right ovarian dominant follicle suspected.  No acute distal bowel process, adenopathy, inguinal abnormality, hernia.  IMPRESSION: Mild right perinephric and periureteral inflammatory changes with associated hydronephrosis and hydroureter but no obstructing radiopaque urinary tract calculus evident.  This can be seen with recent stone passage versus ascending urinary tract infection or pyelonephritis.  Chronic scarring and calcifications of the right kidney as before.  Normal appendix   Original Report Authenticated By: Judie Petit. Ruel Favors, M.D.      1. Pyelonephritis   2. Hydronephrosis       MDM  Agent with confirmed, urinary tract infection. Large hemoglobin on fixed dipstick. Nancy negative. I'm sending the peak agent for CT without contrast. With known stone. She may be passing a stone. Went to rule out hydronephrosis or other complications. Do not think. She has pyelonephritis is she's afebrile not tachycardic.  CT shows hydroureter and hydronephrosis.  No stone. Sx likely from pyelo. Patients  pain and nausea controlled.  She has received iv rocephin She has failed OP  Therapy with cipro and keflex. Last culture showed pan-sensitivity. Will D? Home with sxs contorl and septra.  Patient will f/u with her urologist. Discussed reasons to seek immediate care. Patient expresses understanding and agrees with plan.   Filed Vitals:   07/26/12 1517 07/26/12 2003  BP: 180/123 149/101  Pulse: 84   Temp: 98.5 F (36.9 C)   TempSrc: Oral   Resp: 20   Height: 5\' 4"  (1.626 m)   Weight: 228 lb (103.42 kg)   SpO2: 98%    ,      Arthor Captain, PA-C 07/26/12 2213

## 2012-07-26 NOTE — ED Notes (Signed)
Pt provided a gingerale to drink, cont. Talking on phone, laughing no other needs at this time

## 2012-07-26 NOTE — ED Provider Notes (Signed)
Medical screening examination/treatment/procedure(s) were performed by non-physician practitioner and as supervising physician I was immediately available for consultation/collaboration.  Ethelda Chick, MD 07/26/12 2230

## 2012-07-26 NOTE — ED Notes (Addendum)
Pt reports RLQ pain radiating to right flank.  She was seen last month and diagnosed with 7mm kidney stone.

## 2012-07-26 NOTE — ED Notes (Signed)
Pt ambulated to bathroom without assistance, talking on cell phone in no acute distress

## 2012-07-26 NOTE — ED Notes (Signed)
Pt calls this rn to room, noted with small amount emesis in bag. md notified.

## 2012-07-28 LAB — URINE CULTURE

## 2012-07-29 NOTE — ED Notes (Signed)
+   Urine Patient treated with Bactrim-STS

## 2012-10-14 ENCOUNTER — Encounter (HOSPITAL_BASED_OUTPATIENT_CLINIC_OR_DEPARTMENT_OTHER): Payer: Self-pay | Admitting: *Deleted

## 2012-10-14 ENCOUNTER — Emergency Department (HOSPITAL_BASED_OUTPATIENT_CLINIC_OR_DEPARTMENT_OTHER): Payer: Self-pay

## 2012-10-14 ENCOUNTER — Emergency Department (HOSPITAL_BASED_OUTPATIENT_CLINIC_OR_DEPARTMENT_OTHER)
Admission: EM | Admit: 2012-10-14 | Discharge: 2012-10-14 | Disposition: A | Discharge status: Home / Self Care | Payer: Self-pay | Attending: Emergency Medicine | Admitting: Emergency Medicine

## 2012-10-14 DIAGNOSIS — J45909 Unspecified asthma, uncomplicated: Secondary | ICD-10-CM | POA: Insufficient documentation

## 2012-10-14 DIAGNOSIS — Z79899 Other long term (current) drug therapy: Secondary | ICD-10-CM | POA: Insufficient documentation

## 2012-10-14 DIAGNOSIS — N12 Tubulo-interstitial nephritis, not specified as acute or chronic: Secondary | ICD-10-CM | POA: Insufficient documentation

## 2012-10-14 DIAGNOSIS — R1110 Vomiting, unspecified: Secondary | ICD-10-CM | POA: Insufficient documentation

## 2012-10-14 DIAGNOSIS — R109 Unspecified abdominal pain: Secondary | ICD-10-CM | POA: Insufficient documentation

## 2012-10-14 DIAGNOSIS — Z9889 Other specified postprocedural states: Secondary | ICD-10-CM | POA: Insufficient documentation

## 2012-10-14 DIAGNOSIS — I10 Essential (primary) hypertension: Secondary | ICD-10-CM | POA: Insufficient documentation

## 2012-10-14 DIAGNOSIS — Z8742 Personal history of other diseases of the female genital tract: Secondary | ICD-10-CM | POA: Insufficient documentation

## 2012-10-14 DIAGNOSIS — Z87442 Personal history of urinary calculi: Secondary | ICD-10-CM | POA: Insufficient documentation

## 2012-10-14 DIAGNOSIS — F17200 Nicotine dependence, unspecified, uncomplicated: Secondary | ICD-10-CM | POA: Insufficient documentation

## 2012-10-14 DIAGNOSIS — Z3202 Encounter for pregnancy test, result negative: Secondary | ICD-10-CM | POA: Insufficient documentation

## 2012-10-14 LAB — URINALYSIS, ROUTINE W REFLEX MICROSCOPIC
Glucose, UA: NEGATIVE mg/dL
Ketones, ur: NEGATIVE mg/dL
Nitrite: NEGATIVE
Protein, ur: NEGATIVE mg/dL
Urobilinogen, UA: 0.2 mg/dL (ref 0.0–1.0)

## 2012-10-14 LAB — URINE MICROSCOPIC-ADD ON

## 2012-10-14 LAB — COMPREHENSIVE METABOLIC PANEL
AST: 15 U/L (ref 0–37)
Albumin: 4.1 g/dL (ref 3.5–5.2)
Alkaline Phosphatase: 62 U/L (ref 39–117)
BUN: 11 mg/dL (ref 6–23)
Chloride: 101 mEq/L (ref 96–112)
Potassium: 4 mEq/L (ref 3.5–5.1)
Sodium: 138 mEq/L (ref 135–145)
Total Bilirubin: 0.3 mg/dL (ref 0.3–1.2)
Total Protein: 7.8 g/dL (ref 6.0–8.3)

## 2012-10-14 LAB — LIPASE, BLOOD: Lipase: 23 U/L (ref 11–59)

## 2012-10-14 LAB — CBC WITH DIFFERENTIAL/PLATELET
Basophils Absolute: 0 10*3/uL (ref 0.0–0.1)
Basophils Relative: 0 % (ref 0–1)
Eosinophils Absolute: 0.1 10*3/uL (ref 0.0–0.7)
Hemoglobin: 13.1 g/dL (ref 12.0–15.0)
MCH: 31.9 pg (ref 26.0–34.0)
MCHC: 33.9 g/dL (ref 30.0–36.0)
Neutro Abs: 2.6 10*3/uL (ref 1.7–7.7)
Neutrophils Relative %: 48 % (ref 43–77)
Platelets: 140 10*3/uL — ABNORMAL LOW (ref 150–400)
RDW: 12.5 % (ref 11.5–15.5)

## 2012-10-14 LAB — PREGNANCY, URINE: Preg Test, Ur: NEGATIVE

## 2012-10-14 MED ORDER — SODIUM CHLORIDE 0.9 % IV BOLUS (SEPSIS)
1000.0000 mL | Freq: Once | INTRAVENOUS | Status: AC
  Administered 2012-10-14: 1000 mL via INTRAVENOUS

## 2012-10-14 MED ORDER — PROMETHAZINE HCL 25 MG/ML IJ SOLN
25.0000 mg | Freq: Once | INTRAMUSCULAR | Status: DC
  Filled 2012-10-14 (×2): qty 1

## 2012-10-14 MED ORDER — PROMETHAZINE HCL 25 MG/ML IJ SOLN
25.0000 mg | Freq: Once | INTRAMUSCULAR | Status: AC
  Administered 2012-10-14: 25 mg via INTRAVENOUS
  Filled 2012-10-14: qty 1

## 2012-10-14 MED ORDER — HYDROMORPHONE HCL PF 1 MG/ML IJ SOLN
1.0000 mg | Freq: Once | INTRAMUSCULAR | Status: AC
  Administered 2012-10-14: 1 mg via INTRAMUSCULAR

## 2012-10-14 MED ORDER — PROMETHAZINE HCL 25 MG/ML IJ SOLN
25.0000 mg | Freq: Once | INTRAMUSCULAR | Status: AC
  Administered 2012-10-14: 25 mg via INTRAMUSCULAR

## 2012-10-14 MED ORDER — HYDROMORPHONE HCL PF 1 MG/ML IJ SOLN
1.0000 mg | Freq: Once | INTRAMUSCULAR | Status: DC
  Filled 2012-10-14: qty 1

## 2012-10-14 MED ORDER — PROMETHAZINE HCL 25 MG PO TABS: 25.0000 mg | ORAL_TABLET | Freq: Four times a day (QID) | ORAL | Status: DC | PRN

## 2012-10-14 MED ORDER — HYDROMORPHONE HCL PF 1 MG/ML IJ SOLN
1.0000 mg | Freq: Once | INTRAMUSCULAR | Status: AC
  Administered 2012-10-14: 1 mg via INTRAVENOUS
  Filled 2012-10-14: qty 1

## 2012-10-14 MED ORDER — OXYCODONE-ACETAMINOPHEN 5-325 MG PO TABS: 2.0000 | ORAL_TABLET | ORAL | Status: DC | PRN

## 2012-10-14 NOTE — ED Provider Notes (Signed)
Labs an ultrasound within normal limits. Patient tolerating by mouth's and ready to go home  Gwyneth Sprout, MD 10/14/12 1714

## 2012-10-14 NOTE — ED Provider Notes (Signed)
History     CSN: [id number]  Arrival date & time 10/14/12  1349   First MD Initiated Contact with Patient 10/14/12 1417      Chief Complaint  Patient presents with  . Flank Pain    (Consider location/radiation/quality/duration/timing/severity/associated sxs/prior treatment) The history is provided by the patient.  [name] is a 36 y.o. female hx of recurrent kidney stones being followed at [facility] here with R flank pain. R flank pain x 3 days, sharp, intermittent, no radiation. It got worse yesterday and was associated with vomiting x 3 yesterday. No dysuria but she noted that her urine was cloudy. No gross hematuria. She came to the ED 2 months ago for the same thing and had CT that showed R hydro and she was thought to pass a kidney stone and was treated for pyelo. No fevers or diarrhea. She was almost out of her percocet and phenergan.    Past Medical History  Diagnosis Date  . Hypertension   . Chronic UTI   . Complication of anesthesia   . Asthma   . Kidney stones   . Pyelonephritis, chronic   . Ovarian cyst   . Bacterial vaginosis   . Candidal vaginitis     Past Surgical History  Procedure Date  . Lithotripsy 2000's  . Cystoscopy 2001-2012    "total of 11; for chronic chronic pyelonephritis"  . Tubal ligation 2002  . Nephrostomy     History reviewed. No pertinent family history.  History  Substance Use Topics  . Smoking status: Current Every Day Smoker -- 0.2 packs/day for 18 years    Types: Cigarettes    Last Attempt to Quit: 04/19/2012  . Smokeless tobacco: Never Used  . Alcohol Use: No    OB History    Grav Para Term Preterm Abortions TAB SAB Ect Mult Living                  Review of Systems  Gastrointestinal: Positive for vomiting.  Genitourinary: Positive for flank pain.  All other systems reviewed and are negative.    Allergies  Flagyl; Ketorolac tromethamine; Metoclopramide hcl; and Percocet  Home Medications   Current  Outpatient Rx  Name  Route  Sig  Dispense  Refill  . ACETAMINOPHEN 500 MG PO TABS   Oral   Take 1,000 mg by mouth every 6 (six) hours as needed. Headache ,cramps ,kidney pain         . CEPHALEXIN 500 MG PO CAPS   Oral   Take 1 capsule (500 mg total) by mouth 2 (two) times daily.   6 capsule   0   . CLINDAMYCIN HCL 150 MG PO CAPS   Oral   Take 2 capsules (300 mg total) by mouth 2 (two) times daily.   28 capsule   0   . HYDROCODONE-ACETAMINOPHEN 5-325 MG PO TABS      1-2 tablets po q 6 hours prn moderate to severe pain   20 tablet   0   . LORAZEPAM 1 MG PO TABS   Oral   Take 1 tablet (1 mg total) by mouth every 6 (six) hours as needed for anxiety.   10 tablet   0   . METOPROLOL TARTRATE 25 MG PO TABS   Oral   Take 12.5 mg by mouth 2 (two) times daily.          Marland Kitchen ONDANSETRON 4 MG PO TBDP   Oral   Take 1 tablet (  4 mg total) by mouth every 8 (eight) hours as needed. Nausea and vomiting   20 tablet   0   . OXYCODONE-ACETAMINOPHEN 5-325 MG PO TABS   Oral   Take 2 tablets by mouth every 4 (four) hours as needed for pain.   20 tablet   0   . PROMETHAZINE HCL 25 MG PO TABS   Oral   Take 1 tablet (25 mg total) by mouth every 6 (six) hours as needed for nausea.   20 tablet   0   . PROMETHAZINE HCL 25 MG PO TABS   Oral   Take 1 tablet (25 mg total) by mouth every 6 (six) hours as needed for nausea.   30 tablet   0     LMP 09/12/2012  Physical Exam  Nursing note and vitals reviewed. Constitutional: She is oriented to person, place, and time. She appears well-developed and well-nourished.       Uncomfortable   HENT:  Head: Normocephalic.  Mouth/Throat: Oropharynx is clear and moist.  Eyes: Conjunctivae normal are normal. Pupils are equal, round, and reactive to light.  Neck: Normal range of motion. Neck supple.  Cardiovascular: Normal rate, regular rhythm and normal heart sounds.   Pulmonary/Chest: Effort normal and breath sounds normal. No respiratory  distress. She has no wheezes. She has no rales.  Abdominal: Soft. Bowel sounds are normal. She exhibits no distension. There is no tenderness. There is no rebound.       No RUQ tenderness. No murphy's sign. Mild R CVAT   Musculoskeletal: Normal range of motion.  Neurological: She is alert and oriented to person, place, and time.  Skin: Skin is warm and dry.  Psychiatric: She has a normal mood and affect. Her behavior is normal. Judgment and thought content normal.    ED Course  Procedures (including critical care time)  Labs Reviewed  URINALYSIS, ROUTINE W REFLEX MICROSCOPIC - Abnormal; Notable for the following:    APPearance CLOUDY (*)     Leukocytes, UA SMALL (*)     All other components within normal limits  URINE MICROSCOPIC-ADD ON - Abnormal; Notable for the following:    Squamous Epithelial / LPF FEW (*)     Bacteria, UA FEW (*)     All other components within normal limits  PREGNANCY, URINE  CBC WITH DIFFERENTIAL  COMPREHENSIVE METABOLIC PANEL  LIPASE, BLOOD   No results found.   No diagnosis found.    MDM  [name] is a 36 y.o. female here with R flank pain. Will r/o stone vs pyelo. Will get labs and UA and do renal US to r/o hydro (multiple CTs in the past).   3:35 PM Patinet's UA showed small leuks, no nitrate, + epi, appears to be contaminated. No microscopic hematuria. I held off on treatment since she is not having symptoms. Labs pending. US renal pending. I signed out to Dr. Deveron Furlong in the ED.          Richardean Canal, MD 10/14/12 1537

## 2012-10-14 NOTE — ED Notes (Signed)
Pt amb to triage with quick steady gait in nad. Pt reports right flank pain x 3 days, her usual kidney stone pain. Pt states she has had over 14 stones in the past and this is her usual stone presentation. Urine sent from triage.

## 2012-10-14 NOTE — ED Notes (Signed)
IV attempt by Conard Novak, RN x 2 and this RN x 1-EDP aware of poor vein selection-orders to give meds IM and allow pt to go to [place] due to no [place] from 3p-5p today-pt is aware and agreeable

## 2012-10-14 NOTE — ED Notes (Signed)
Advised by lab that pt CBC tube clotted and PLTs count may be off-EDP [place] notified-no orders for redraw

## 2012-10-16 LAB — URINE CULTURE

## 2012-12-03 ENCOUNTER — Encounter (HOSPITAL_BASED_OUTPATIENT_CLINIC_OR_DEPARTMENT_OTHER): Payer: Self-pay | Admitting: *Deleted

## 2012-12-03 ENCOUNTER — Inpatient Hospital Stay (HOSPITAL_BASED_OUTPATIENT_CLINIC_OR_DEPARTMENT_OTHER)
Admission: EM | Admit: 2012-12-03 | Discharge: 2012-12-07 | DRG: 321 | Disposition: A | Discharge status: Home / Self Care | Payer: BC Managed Care – PPO | Attending: Internal Medicine | Admitting: Internal Medicine

## 2012-12-03 DIAGNOSIS — N1330 Unspecified hydronephrosis: Secondary | ICD-10-CM | POA: Diagnosis present

## 2012-12-03 DIAGNOSIS — J45909 Unspecified asthma, uncomplicated: Secondary | ICD-10-CM

## 2012-12-03 DIAGNOSIS — I10 Essential (primary) hypertension: Secondary | ICD-10-CM

## 2012-12-03 DIAGNOSIS — A498 Other bacterial infections of unspecified site: Secondary | ICD-10-CM | POA: Diagnosis present

## 2012-12-03 DIAGNOSIS — R1110 Vomiting, unspecified: Secondary | ICD-10-CM

## 2012-12-03 DIAGNOSIS — N12 Tubulo-interstitial nephritis, not specified as acute or chronic: Principal | ICD-10-CM

## 2012-12-03 DIAGNOSIS — Z79899 Other long term (current) drug therapy: Secondary | ICD-10-CM

## 2012-12-03 DIAGNOSIS — N83209 Unspecified ovarian cyst, unspecified side: Secondary | ICD-10-CM | POA: Diagnosis present

## 2012-12-03 DIAGNOSIS — R112 Nausea with vomiting, unspecified: Secondary | ICD-10-CM | POA: Diagnosis present

## 2012-12-03 DIAGNOSIS — F17200 Nicotine dependence, unspecified, uncomplicated: Secondary | ICD-10-CM | POA: Diagnosis present

## 2012-12-03 LAB — CBC WITH DIFFERENTIAL/PLATELET
Basophils Absolute: 0 10*3/uL (ref 0.0–0.1)
Eosinophils Relative: 1 % (ref 0–5)
Lymphocytes Relative: 33 % (ref 12–46)
Lymphs Abs: 2.1 10*3/uL (ref 0.7–4.0)
Neutro Abs: 3.8 10*3/uL (ref 1.7–7.7)
Neutrophils Relative %: 59 % (ref 43–77)
Platelets: 257 10*3/uL (ref 150–400)
RBC: 3.59 MIL/uL — ABNORMAL LOW (ref 3.87–5.11)
RDW: 12.7 % (ref 11.5–15.5)
WBC: 6.5 10*3/uL (ref 4.0–10.5)

## 2012-12-03 LAB — URINE MICROSCOPIC-ADD ON

## 2012-12-03 LAB — URINALYSIS, ROUTINE W REFLEX MICROSCOPIC
Glucose, UA: NEGATIVE mg/dL
Ketones, ur: NEGATIVE mg/dL
Nitrite: POSITIVE — AB
Specific Gravity, Urine: 1.015 (ref 1.005–1.030)
pH: 6 (ref 5.0–8.0)

## 2012-12-03 MED ORDER — SODIUM CHLORIDE 0.9 % IV BOLUS (SEPSIS)
1000.0000 mL | Freq: Once | INTRAVENOUS | Status: AC
  Administered 2012-12-04: 1000 mL via INTRAVENOUS

## 2012-12-03 MED ORDER — ONDANSETRON HCL 4 MG/2ML IJ SOLN
4.0000 mg | Freq: Once | INTRAMUSCULAR | Status: AC
  Administered 2012-12-03: 4 mg via INTRAMUSCULAR
  Filled 2012-12-03: qty 2

## 2012-12-03 MED ORDER — ONDANSETRON HCL 4 MG/2ML IJ SOLN
4.0000 mg | Freq: Once | INTRAMUSCULAR | Status: AC
  Administered 2012-12-03: 4 mg via INTRAVENOUS
  Filled 2012-12-03: qty 2

## 2012-12-03 MED ORDER — CEFTRIAXONE SODIUM 1 G IJ SOLR
1.0000 g | Freq: Once | INTRAMUSCULAR | Status: AC
  Administered 2012-12-03: 1 g via INTRAMUSCULAR
  Filled 2012-12-03: qty 10

## 2012-12-03 MED ORDER — HYDROMORPHONE HCL PF 2 MG/ML IJ SOLN
2.0000 mg | Freq: Once | INTRAMUSCULAR | Status: AC
  Administered 2012-12-03: 2 mg via INTRAMUSCULAR
  Filled 2012-12-03: qty 1

## 2012-12-03 MED ORDER — ONDANSETRON 4 MG PO TBDP
ORAL_TABLET | ORAL | Status: AC
  Administered 2012-12-03: 4 mg
  Filled 2012-12-03: qty 1

## 2012-12-03 MED ORDER — LIDOCAINE HCL 2 % IJ SOLN
INTRAMUSCULAR | Status: AC
  Administered 2012-12-04: 05:00:00
  Filled 2012-12-03: qty 20

## 2012-12-03 NOTE — ED Notes (Signed)
Pt states she has a hx of kidney stones and has had right flank pain x 2 days. +N/V. "Dr at work told her she probably had a kidney infection and gave her a shot of Phenergan.

## 2012-12-03 NOTE — ED Provider Notes (Addendum)
History  This chart was scribed for Mikael Skoda B. Bernette Mayers, MD by Shari Heritage, ED Scribe. The patient was seen in room MH02/MH02. Patient's care was started at 1944.   CSN: [id number]  Arrival date & time 12/03/12  1904   First MD Initiated Contact with Patient 12/03/12 1944      Chief Complaint  Patient presents with  . Flank Pain     The history is provided by the patient. No language interpreter was used.    HPI Comments: [name] is a 37 y.o. female with history of chronic UTI, kidney stones and pyelonephritis who presents to the Emergency Department complaining of constant, moderate, non-radiating, right flank pain onset 2 days ago. There is associated nausea, vomiting, and malodorous urine. Patient states that she had been "feeling bad" for the past couple of days, then today she began vomiting after eating at work. Patient works at [employer] and states a doctor there suspected a kidney infection and had her urine checked. He also took her temperature which was 100.6 and gave her a shot of Phenergan. She states that it did not improve nausea. She denies vaginal bleeding or vaginal discharge. Patient has been seen for similar complaints here before. She is followed for chronic conditions by a urologist within the [facility]. Relevant surgical history includes lithotripsy, cytoscopy and nephrostomy. Patient's other medical history includes HTN, asthma and ovarian cysts.    Past Medical History  Diagnosis Date  . Hypertension   . Chronic UTI   . Complication of anesthesia   . Asthma   . Kidney stones   . Pyelonephritis, chronic   . Ovarian cyst   . Bacterial vaginosis   . Candidal vaginitis     Past Surgical History  Procedure Laterality Date  . Lithotripsy  2000's  . Cystoscopy  2001-2012    "total of 11; for chronic chronic pyelonephritis"  . Tubal ligation  2002  . Nephrostomy      History reviewed. No pertinent family history.  History   Substance Use Topics  . Smoking status: Current Every Day Smoker -- 0.25 packs/day for 18 years    Types: Cigarettes    Last Attempt to Quit: 04/19/2012  . Smokeless tobacco: Never Used  . Alcohol Use: No    OB History   Grav Para Term Preterm Abortions TAB SAB Ect Mult Living                  Review of Systems A complete 10 system review of systems was obtained and all systems are negative except as noted in the HPI and PMH.   Allergies  Flagyl; Ketorolac tromethamine; Metoclopramide hcl; and Percocet  Home Medications   Current Outpatient Rx  Name  Route  Sig  Dispense  Refill  . acetaminophen (TYLENOL) 500 MG tablet   Oral   Take 1,000 mg by mouth every 6 (six) hours as needed. Headache ,cramps ,kidney pain         . cephALEXin (KEFLEX) 500 MG capsule   Oral   Take 1 capsule (500 mg total) by mouth 2 (two) times daily.   6 capsule   0   . clindamycin (CLEOCIN) 150 MG capsule   Oral   Take 2 capsules (300 mg total) by mouth 2 (two) times daily.   28 capsule   0   . HYDROcodone-acetaminophen (NORCO/VICODIN) 5-325 MG per tablet      1-2 tablets po q 6 hours prn moderate to severe  pain   20 tablet   0   . LORazepam (ATIVAN) 1 MG tablet   Oral   Take 1 tablet (1 mg total) by mouth every 6 (six) hours as needed for anxiety.   10 tablet   0   . metoprolol tartrate (LOPRESSOR) 25 MG tablet   Oral   Take 12.5 mg by mouth 2 (two) times daily.          . ondansetron (ZOFRAN-ODT) 4 MG disintegrating tablet   Oral   Take 1 tablet (4 mg total) by mouth every 8 (eight) hours as needed. Nausea and vomiting   20 tablet   0   . oxyCODONE-acetaminophen (PERCOCET) 5-325 MG per tablet   Oral   Take 2 tablets by mouth every 4 (four) hours as needed for pain.   20 tablet   0   . oxyCODONE-acetaminophen (PERCOCET) 5-325 MG per tablet   Oral   Take 2 tablets by mouth every 4 (four) hours as needed for pain.   10 tablet   0   . promethazine (PHENERGAN) 25 MG  tablet   Oral   Take 1 tablet (25 mg total) by mouth every 6 (six) hours as needed for nausea.   20 tablet   0   . promethazine (PHENERGAN) 25 MG tablet   Oral   Take 1 tablet (25 mg total) by mouth every 6 (six) hours as needed for nausea.   30 tablet   0   . promethazine (PHENERGAN) 25 MG tablet   Oral   Take 1 tablet (25 mg total) by mouth every 6 (six) hours as needed for nausea.   20 tablet   0     Triage Vitals: BP 167/107  Pulse 74  Temp(Src) 98.2 F (36.8 C) (Oral)  Resp 20  Ht 5\' 5"  (1.651 m)  Wt 236 lb (107.049 kg)  BMI 39.27 kg/m2  SpO2 97%  LMP 11/22/2012  Physical Exam  Nursing note and vitals reviewed. Constitutional: She is oriented to person, place, and time. She appears well-developed and well-nourished.  HENT:  Head: Normocephalic and atraumatic.  Eyes: EOM are normal. Pupils are equal, round, and reactive to light.  Neck: Normal range of motion. Neck supple.  Cardiovascular: Normal rate, normal heart sounds and intact distal pulses.   Pulmonary/Chest: Effort normal and breath sounds normal.  Abdominal: Bowel sounds are normal. She exhibits no distension. There is no tenderness.  Genitourinary:  Right CVA tenderness.  Musculoskeletal: Normal range of motion. She exhibits no edema and no tenderness.  Neurological: She is alert and oriented to person, place, and time. She has normal strength. No cranial nerve deficit or sensory deficit.  Skin: Skin is warm and dry. No rash noted.  Psychiatric: She has a normal mood and affect.    ED Course  Procedures (including critical care time) DIAGNOSTIC STUDIES: Oxygen Saturation is 97% on room air, adequate by my interpretation.    COORDINATION OF CARE: 7:53 PM- Patient informed of current plan for treatment and evaluation and agrees with plan at this time.      Labs Reviewed  URINALYSIS, ROUTINE W REFLEX MICROSCOPIC - Abnormal; Notable for the following:    APPearance CLOUDY (*)    Hgb urine  dipstick LARGE (*)    Protein, ur 100 (*)    Nitrite POSITIVE (*)    Leukocytes, UA LARGE (*)    All other components within normal limits  URINE MICROSCOPIC-ADD ON - Abnormal; Notable for the following:  Squamous Epithelial / LPF MANY (*)    Bacteria, UA MANY (*)    All other components within normal limits  URINE CULTURE  PREGNANCY, URINE   No results found.   No diagnosis found.    MDM  Pt with history of recurrent UTI related to anatomic abnormality has UTI. Previous cultures have been mostly pan-sensitive. She has history of difficulty with IV access and blood draws and would prefer to treat pain, nausea and infection with IM meds.    10:36 PM Pt unable to tolerate PO fluids after 2 doses of Zofran. Will have to attempt IV placement. Will add basic labs while giving IVF.  Care signed out Dr. Deretha Emory at the change of shift.   I personally performed the services described in this documentation, which was scribed in my presence. The recorded information has been reviewed and is accurate.     Mahin Guardia B. Bernette Mayers, MD 12/03/12 [phone]

## 2012-12-04 ENCOUNTER — Emergency Department (HOSPITAL_BASED_OUTPATIENT_CLINIC_OR_DEPARTMENT_OTHER): Payer: BC Managed Care – PPO

## 2012-12-04 ENCOUNTER — Encounter (HOSPITAL_COMMUNITY): Payer: Self-pay | Admitting: *Deleted

## 2012-12-04 LAB — BASIC METABOLIC PANEL
CO2: 23 mEq/L (ref 19–32)
Calcium: 8.8 mg/dL (ref 8.4–10.5)
Chloride: 105 mEq/L (ref 96–112)
Potassium: 3.5 mEq/L (ref 3.5–5.1)
Sodium: 140 mEq/L (ref 135–145)

## 2012-12-04 MED ORDER — SODIUM CHLORIDE 0.9 % IV SOLN
INTRAVENOUS | Status: AC
  Administered 2012-12-04: 05:00:00 via INTRAVENOUS

## 2012-12-04 MED ORDER — HYDROMORPHONE HCL PF 1 MG/ML IJ SOLN
1.0000 mg | Freq: Once | INTRAMUSCULAR | Status: DC
  Filled 2012-12-04: qty 1

## 2012-12-04 MED ORDER — PROMETHAZINE HCL 25 MG/ML IJ SOLN
INTRAMUSCULAR | Status: AC
  Filled 2012-12-04: qty 1

## 2012-12-04 MED ORDER — ONDANSETRON HCL 4 MG PO TABS
4.0000 mg | ORAL_TABLET | Freq: Four times a day (QID) | ORAL | Status: DC | PRN
  Administered 2012-12-07: 4 mg via ORAL
  Filled 2012-12-04: qty 1

## 2012-12-04 MED ORDER — DEXTROSE 5 % IV SOLN
1.0000 g | INTRAVENOUS | Status: DC
  Administered 2012-12-04 – 2012-12-05 (×2): 1 g via INTRAVENOUS
  Filled 2012-12-04 (×3): qty 10

## 2012-12-04 MED ORDER — SODIUM CHLORIDE 0.9 % IV SOLN
INTRAVENOUS | Status: DC
  Administered 2012-12-04 – 2012-12-06 (×5): via INTRAVENOUS

## 2012-12-04 MED ORDER — ONDANSETRON HCL 4 MG/2ML IJ SOLN
4.0000 mg | Freq: Four times a day (QID) | INTRAMUSCULAR | Status: DC | PRN
  Administered 2012-12-04 – 2012-12-06 (×8): 4 mg via INTRAVENOUS
  Filled 2012-12-04 (×8): qty 2

## 2012-12-04 MED ORDER — ENOXAPARIN SODIUM 40 MG/0.4ML ~~LOC~~ SOLN
40.0000 mg | SUBCUTANEOUS | Status: DC
  Administered 2012-12-04 – 2012-12-07 (×4): 40 mg via SUBCUTANEOUS
  Filled 2012-12-04 (×4): qty 0.4

## 2012-12-04 MED ORDER — METOPROLOL TARTRATE 12.5 MG HALF TABLET
12.5000 mg | ORAL_TABLET | Freq: Two times a day (BID) | ORAL | Status: DC
  Administered 2012-12-04 – 2012-12-07 (×7): 12.5 mg via ORAL
  Filled 2012-12-04 (×8): qty 1

## 2012-12-04 MED ORDER — ONDANSETRON HCL 4 MG/2ML IJ SOLN: 4.0000 mg | Freq: Three times a day (TID) | INTRAMUSCULAR | Status: DC | PRN

## 2012-12-04 MED ORDER — PROMETHAZINE HCL 25 MG/ML IJ SOLN
12.5000 mg | Freq: Once | INTRAMUSCULAR | Status: AC
  Administered 2012-12-04: 12.5 mg via INTRAVENOUS
  Filled 2012-12-04: qty 1

## 2012-12-04 MED ORDER — PROMETHAZINE HCL 25 MG/ML IJ SOLN
12.5000 mg | Freq: Once | INTRAMUSCULAR | Status: AC
  Administered 2012-12-04: 12.5 mg via INTRAVENOUS
  Administered 2012-12-04: 02:00:00 via INTRAVENOUS

## 2012-12-04 MED ORDER — HYDROMORPHONE HCL PF 1 MG/ML IJ SOLN
1.0000 mg | INTRAMUSCULAR | Status: DC | PRN
  Administered 2012-12-04: 1 mg via INTRAVENOUS
  Filled 2012-12-04: qty 1

## 2012-12-04 MED ORDER — HYDROMORPHONE HCL PF 1 MG/ML IJ SOLN
0.5000 mg | INTRAMUSCULAR | Status: DC | PRN
  Administered 2012-12-04 – 2012-12-06 (×8): 0.5 mg via INTRAVENOUS
  Filled 2012-12-04 (×9): qty 1

## 2012-12-04 NOTE — Progress Notes (Signed)
[name] [id number] Admitted to 5503: 12/04/2012 6:19 AM Attending Provider: Tarry Kos, MD    [name] is a 37 y.o. female patient admitted from ED awake, alert  & orientated  X 3,  Prior, VSS - Blood pressure 150/104, pulse 77, temperature 98.5 F (36.9 C), temperature source Oral, resp. rate 18, height 5\' 5"  (1.651 m), weight 107.049 kg (236 lb), last menstrual period 11/22/2012, SpO2 99.00%. RA, no c/o shortness of breath, no c/o chest pain, no distress noted.    IV site WDL:  with a transparent dsg that's clean dry and intact.  Allergies:   Allergies  Allergen Reactions  . Flagyl (Metronidazole Hcl) Hives  . Ketorolac Tromethamine Hives  . Metoclopramide Hcl Other (See Comments)    Facial spasm; "they thought I was having a stroke"  . Percocet (Oxycodone-Acetaminophen) Hives and Itching     Past Medical History  Diagnosis Date  . Hypertension   . Chronic UTI   . Complication of anesthesia   . Asthma   . Kidney stones   . Pyelonephritis, chronic   . Ovarian cyst   . Bacterial vaginosis   . Candidal vaginitis     History:  obtained from patient  Pt orientation to unit, room and routine. Information packet given to patient and safety video refused.  Admission INP armband ID verified with patient, and in place. SR up x 2, fall risk assessment complete with Patient verbalizing understanding of risks associated with falls. Pt verbalizes an understanding of how to use the call bell and to call for help before getting out of bed.  Skin, clean-dry- intact without evidence of bruising, or skin tears.   No evidence of skin break down noted on exam.  Will cont to monitor and assist as needed.  Elisha Ponder, RN 12/04/2012 6:19 AM

## 2012-12-04 NOTE — H&P (Signed)
[facility] ADMISSION NOTE  History and Physical  [name] NWG:[id number] DOB: 1975-12-17 DOA: 12/03/2012  Referring physician: [facility] PCP: No primary provider on file.   Chief Complaint: flank pain  HPI: 37 y.o. female with history of chronic UTI, kidney stones and pyelonephritis presented to the  [facility] Emergency Department complaining of  right flank pain onset 2 days ago, associated with  nausea, vomiting, and malodorous urine. She was transferred to [facility] for admission because of inability to tolerate PO fluids.   She has had ED visits and hospital admissions for same problem in the past. She describes her flank pain as constant, moderate intensity discomfort. She states that she started vomiting today at work. Vomitus was non bilious and non bloody.  Patient works at [employer] and states a doctor there suspected a kidney infection and had her urine checked. He also took her temperature which was 100.6 and gave her a shot of Phenergan. She states that it did not improve nausea. She denies vaginal bleeding or vaginal discharge.  She is followed for chronic conditions by a urologist within the [facility]. Relevant surgical history includes lithotripsy, cytoscopy and nephrostomy.  Review of Systems:  General: negative for fever. Positive for fatigue CVS: Negative for chest pain, SOB , palpations or leg swelling Respiratory: Negative for chest tightness, cough or wheezing Abdomen: Negative for abdominal pain. Positive for nausea and vomiting. Neurologic: Negative for weakness, numbness or syncope.  Past Medical History  Diagnosis Date  . Hypertension   . Chronic UTI   . Complication of anesthesia   . Asthma   . Kidney stones   . Pyelonephritis, chronic   . Ovarian cyst   . Bacterial vaginosis   . Candidal vaginitis     Past Surgical History  Procedure Laterality Date  . Lithotripsy  2000's  . Cystoscopy  2001-2012    "total of 11; for chronic  chronic pyelonephritis"  . Tubal ligation  2002  . Nephrostomy      Social History:  reports that she has been smoking Cigarettes.  She has a 4.5 pack-year smoking history. She has never used smokeless tobacco. She reports that she does not drink alcohol or use illicit drugs.  Allergies  Allergen Reactions  . Flagyl (Metronidazole Hcl) Hives  . Ketorolac Tromethamine Hives  . Metoclopramide Hcl Other (See Comments)    Facial spasm; "they thought I was having a stroke"  . Percocet (Oxycodone-Acetaminophen) Hives and Itching    History reviewed. No pertinent family history.   Prior to Admission medications   Medication Sig Start Date End Date Taking? Authorizing Provider  acetaminophen (TYLENOL) 500 MG tablet Take 1,000 mg by mouth every 6 (six) hours as needed. Headache ,cramps ,kidney pain    Historical Provider, MD  metoprolol tartrate (LOPRESSOR) 25 MG tablet Take 12.5 mg by mouth 2 (two) times daily.  05/13/11   Geoffery Lyons, MD   Physical Exam: Filed Vitals:   12/03/12 1917 12/04/12 0231 12/04/12 0522 12/04/12 0608  BP: 167/107 144/97 128/78 150/104  Pulse: 74 71 74 77  Temp: 98.2 F (36.8 C) 98.1 F (36.7 C) 98.9 F (37.2 C) 98.5 F (36.9 C)  TempSrc: Oral Oral  Oral  Resp: 20   18  Height: 5\' 5"  (1.651 m)     Weight: 236 lb (107.049 kg)     SpO2: 97% 99%  99%    General:  Alert, oriented to person, place, and time. She appears well-developed and well-nourished  HEENT: Normocephalic, atraumatic, PERRLA, EOMI  Neck: supple, no lymphadenopathy, no thyromegaly  Cardiovascular: normal rate, regular rhythm, no murmur , rubs or gallop  Respiratory: clear to auscultation bilaterally  Abdomen: soft, non tender , positive bowel sounds, no organomegaly  Skin: warm and dry  Genitourinary : Right CVA tenderness present  Musculoskeletal: moving all four extremities  Psychiatric: she has normal mood and affect  Neurologic: AxOx3, grossly normal.  Wt Readings from  Last 3 Encounters:  12/03/12 236 lb (107.049 kg)  07/26/12 228 lb (103.42 kg)  07/07/12 230 lb (104.327 kg)    Labs on Admission:  Basic Metabolic Panel:  Recent Labs Lab 12/03/12 2335  NA 140  K 3.5  CL 105  CO2 23  GLUCOSE 130*  BUN 9  CREATININE 0.80  CALCIUM 8.8    CBC:  Recent Labs Lab 12/03/12 2335  WBC 6.5  NEUTROABS 3.8  HGB 11.5*  HCT 33.9*  MCV 94.4  PLT 257      Radiological Exams on Admission: Ct Abdomen Pelvis Wo Contrast  12/04/2012  *RADIOLOGY REPORT*  Clinical Data: Right flank pain for 2 days; nausea and vomiting.  CT ABDOMEN AND PELVIS WITHOUT CONTRAST  Technique:  Multidetector CT imaging of the abdomen and pelvis was performed following the standard protocol without intravenous contrast.  Comparison: CT of the abdomen and pelvis from 07/26/2012, and pelvic ultrasound performed 07/07/2012; renal ultrasound performed 10/14/2012  Findings: The visualized lung bases are clear.  The liver and spleen are unremarkable in appearance.  The gallbladder is within normal limits.  The pancreas and adrenal glands are unremarkable.  Right renal scarring and atrophy are seen, with scattered calcifications and apparent parapelvic cysts.  There is apparent mild right-sided hydronephrosis, less prominent than in October but more prominent than in December.  No obstructing ureteral stones are identified.  Mild nonspecific perinephric stranding is noted at the left kidney; the left kidney is otherwise unremarkable in appearance.  No free fluid is identified.  The small bowel is unremarkable in appearance.  The stomach is within normal limits.  No acute vascular abnormalities are seen.  The appendix is normal in caliber, without evidence for appendicitis.  The colon is largely decompressed and unremarkable in appearance.  The bladder is mildly distended and grossly unremarkable.  The uterus is within normal limits.  The ovaries are relatively symmetric; no suspicious adnexal  masses are seen.  No inguinal lymphadenopathy is seen.  No acute osseous abnormalities are identified.  IMPRESSION:  1.  Apparent recurrent mild right-sided hydronephrosis; no obstructing ureteral stones seen.  This may reflect a recently passed stone, as the hydronephrosis is new from the prior renal ultrasound. 2.  Right renal scarring and atrophy again noted, with scattered calcifications and apparent parapelvic cysts.   Original Report Authenticated By: Tonia Ghent, M.D.     EKG: No new EKG   Principal Problem:   Pyelonephritis Active Problems:   HTN (hypertension)   Asthma   Assessment/Plan # Recurrent Right sided Pyelonephritis: Patient with recurrent history of UTI and structural abnormality in her right kidney presents with right flank pain, nausea and vomiting. She is afebrile and normotensive. Her WBC and electrolytes are WNL. CT abdomen shows right renal scarring and atrophy with calcifications and parapelvic cysts. She was also noted to have right sided hydronephrosis. Patient has been seen for similar problem in the past. Admitted to med- surg bed Continue IV ceftriaxone F/U urine cultures Order Blood cultures zofran for nausea IV fluids for hydration  Pain control   HTN: BP mildly elevated. Restart home dose of metoprolol.  Asthma: Stable. Continue to monitor.  Code Status: Full Family Communication: Patient was updated on the plan of care Disposition Plan/Anticipated LOS: likely in 2-3 days  Time spent: 70 minutes  Lars Mage, MD  [facility] Team 11  If 7PM-7AM, please contact night-coverage at [email or web address], password [facility] 12/04/2012, 8:00 AM

## 2012-12-04 NOTE — ED Provider Notes (Addendum)
Results for orders placed during the hospital encounter of 12/03/12  URINALYSIS, ROUTINE W REFLEX MICROSCOPIC      Result Value Range   Color, Urine YELLOW  YELLOW   APPearance CLOUDY (*) CLEAR   Specific Gravity, Urine 1.015  1.005 - 1.030   pH 6.0  5.0 - 8.0   Glucose, UA NEGATIVE  NEGATIVE mg/dL   Hgb urine dipstick LARGE (*) NEGATIVE   Bilirubin Urine NEGATIVE  NEGATIVE   Ketones, ur NEGATIVE  NEGATIVE mg/dL   Protein, ur 161 (*) NEGATIVE mg/dL   Urobilinogen, UA 0.2  0.0 - 1.0 mg/dL   Nitrite POSITIVE (*) NEGATIVE   Leukocytes, UA LARGE (*) NEGATIVE  PREGNANCY, URINE      Result Value Range   Preg Test, Ur NEGATIVE  NEGATIVE  URINE MICROSCOPIC-ADD ON      Result Value Range   Squamous Epithelial / LPF MANY (*) RARE   WBC, UA TOO NUMEROUS TO COUNT  <3 WBC/hpf   RBC / HPF 21-50  <3 RBC/hpf   Bacteria, UA MANY (*) RARE   Urine-Other FEW YEAST    CBC WITH DIFFERENTIAL      Result Value Range   WBC 6.5  4.0 - 10.5 K/uL   RBC 3.59 (*) 3.87 - 5.11 MIL/uL   Hemoglobin 11.5 (*) 12.0 - 15.0 g/dL   HCT 09.6 (*) 04.5 - 40.9 %   MCV 94.4  78.0 - 100.0 fL   MCH 32.0  26.0 - 34.0 pg   MCHC 33.9  30.0 - 36.0 g/dL   RDW 81.1  91.4 - 78.2 %   Platelets 257  150 - 400 K/uL   Neutrophils Relative 59  43 - 77 %   Neutro Abs 3.8  1.7 - 7.7 K/uL   Lymphocytes Relative 33  12 - 46 %   Lymphs Abs 2.1  0.7 - 4.0 K/uL   Monocytes Relative 8  3 - 12 %   Monocytes Absolute 0.5  0.1 - 1.0 K/uL   Eosinophils Relative 1  0 - 5 %   Eosinophils Absolute 0.1  0.0 - 0.7 K/uL   Basophils Relative 0  0 - 1 %   Basophils Absolute 0.0  0.0 - 0.1 K/uL  BASIC METABOLIC PANEL      Result Value Range   Sodium 140  135 - 145 mEq/L   Potassium 3.5  3.5 - 5.1 mEq/L   Chloride 105  96 - 112 mEq/L   CO2 23  19 - 32 mEq/L   Glucose, Bld 130 (*) 70 - 99 mg/dL   BUN 9  6 - 23 mg/dL   Creatinine, Ser 9.56  0.50 - 1.10 mg/dL   Calcium 8.8  8.4 - 21.3 mg/dL   GFR calc non Af Amer >90  >90 mL/min   GFR calc Af  Amer >90  >90 mL/min     Patient turned over to me. Patient with history of frequent recurrent urinary tract infections in the past. Said 2 days of pain labs consistently UTI but pains been in the right flank. Clinically this is suggestive of pyelonephritis. Patient continued to have dry heaves and nausea despite multiple antibiotics. Patient's had fluids in the ED also given 1 g of Rocephin. Urine culture is pending. Patient will require admission because she's not able to keep medications down. We'll discuss with hospitalist on-call for transfer via [facility].  Upon further review patient has had a history of kidney stones in the past last ultrasound here  was back in December she claims it 1.work 2 weeks ago which showed a large stone up in the kidney but nothing in the ureter. Possible that stone couldn't move down and could be obstructed we will have to do a CT scan to rule this out ultrasound not available at this time a night. This information we important to note from a spot where she'll be admitted basically about her admission.   Results for orders placed during the hospital encounter of 12/03/12  URINALYSIS, ROUTINE W REFLEX MICROSCOPIC      Result Value Range   Color, Urine YELLOW  YELLOW   APPearance CLOUDY (*) CLEAR   Specific Gravity, Urine 1.015  1.005 - 1.030   pH 6.0  5.0 - 8.0   Glucose, UA NEGATIVE  NEGATIVE mg/dL   Hgb urine dipstick LARGE (*) NEGATIVE   Bilirubin Urine NEGATIVE  NEGATIVE   Ketones, ur NEGATIVE  NEGATIVE mg/dL   Protein, ur 295 (*) NEGATIVE mg/dL   Urobilinogen, UA 0.2  0.0 - 1.0 mg/dL   Nitrite POSITIVE (*) NEGATIVE   Leukocytes, UA LARGE (*) NEGATIVE  PREGNANCY, URINE      Result Value Range   Preg Test, Ur NEGATIVE  NEGATIVE  URINE MICROSCOPIC-ADD ON      Result Value Range   Squamous Epithelial / LPF MANY (*) RARE   WBC, UA TOO NUMEROUS TO COUNT  <3 WBC/hpf   RBC / HPF 21-50  <3 RBC/hpf   Bacteria, UA MANY (*) RARE   Urine-Other FEW YEAST     CBC WITH DIFFERENTIAL      Result Value Range   WBC 6.5  4.0 - 10.5 K/uL   RBC 3.59 (*) 3.87 - 5.11 MIL/uL   Hemoglobin 11.5 (*) 12.0 - 15.0 g/dL   HCT 28.4 (*) 13.2 - 44.0 %   MCV 94.4  78.0 - 100.0 fL   MCH 32.0  26.0 - 34.0 pg   MCHC 33.9  30.0 - 36.0 g/dL   RDW 10.2  72.5 - 36.6 %   Platelets 257  150 - 400 K/uL   Neutrophils Relative 59  43 - 77 %   Neutro Abs 3.8  1.7 - 7.7 K/uL   Lymphocytes Relative 33  12 - 46 %   Lymphs Abs 2.1  0.7 - 4.0 K/uL   Monocytes Relative 8  3 - 12 %   Monocytes Absolute 0.5  0.1 - 1.0 K/uL   Eosinophils Relative 1  0 - 5 %   Eosinophils Absolute 0.1  0.0 - 0.7 K/uL   Basophils Relative 0  0 - 1 %   Basophils Absolute 0.0  0.0 - 0.1 K/uL  BASIC METABOLIC PANEL      Result Value Range   Sodium 140  135 - 145 mEq/L   Potassium 3.5  3.5 - 5.1 mEq/L   Chloride 105  96 - 112 mEq/L   CO2 23  19 - 32 mEq/L   Glucose, Bld 130 (*) 70 - 99 mg/dL   BUN 9  6 - 23 mg/dL   Creatinine, Ser 4.40  0.50 - 1.10 mg/dL   Calcium 8.8  8.4 - 34.7 mg/dL   GFR calc non Af Amer >90  >90 mL/min   GFR calc Af Amer >90  >90 mL/min   Ct Abdomen Pelvis Wo Contrast  12/04/2012  *RADIOLOGY REPORT*  Clinical Data: Right flank pain for 2 days; nausea and vomiting.  CT ABDOMEN AND PELVIS WITHOUT CONTRAST  Technique:  Multidetector CT imaging of the abdomen and pelvis was performed following the standard protocol without intravenous contrast.  Comparison: CT of the abdomen and pelvis from 07/26/2012, and pelvic ultrasound performed 07/07/2012; renal ultrasound performed 10/14/2012  Findings: The visualized lung bases are clear.  The liver and spleen are unremarkable in appearance.  The gallbladder is within normal limits.  The pancreas and adrenal glands are unremarkable.  Right renal scarring and atrophy are seen, with scattered calcifications and apparent parapelvic cysts.  There is apparent mild right-sided hydronephrosis, less prominent than in October but more prominent than  in December.  No obstructing ureteral stones are identified.  Mild nonspecific perinephric stranding is noted at the left kidney; the left kidney is otherwise unremarkable in appearance.  No free fluid is identified.  The small bowel is unremarkable in appearance.  The stomach is within normal limits.  No acute vascular abnormalities are seen.  The appendix is normal in caliber, without evidence for appendicitis.  The colon is largely decompressed and unremarkable in appearance.  The bladder is mildly distended and grossly unremarkable.  The uterus is within normal limits.  The ovaries are relatively symmetric; no suspicious adnexal masses are seen.  No inguinal lymphadenopathy is seen.  No acute osseous abnormalities are identified.  IMPRESSION:  1.  Apparent recurrent mild right-sided hydronephrosis; no obstructing ureteral stones seen.  This may reflect a recently passed stone, as the hydronephrosis is new from the prior renal ultrasound. 2.  Right renal scarring and atrophy again noted, with scattered calcifications and apparent parapelvic cysts.   Original Report Authenticated By: Tonia Ghent, M.D.     CT scan without evidence of ureteral stone. However hydronephrosis is present but this may be due to pyelonephritis.   Admission arranged to [facility] triad seen 10 MedSurg bed [facility] to transport.   Shelda Jakes, MD 12/04/12 4540  Shelda Jakes, MD 12/04/12 9811  Shelda Jakes, MD 12/04/12 [phone]

## 2012-12-05 LAB — BASIC METABOLIC PANEL
CO2: 24 mEq/L (ref 19–32)
Chloride: 104 mEq/L (ref 96–112)
Glucose, Bld: 106 mg/dL — ABNORMAL HIGH (ref 70–99)
Potassium: 3.6 mEq/L (ref 3.5–5.1)
Sodium: 138 mEq/L (ref 135–145)

## 2012-12-05 LAB — CBC WITH DIFFERENTIAL/PLATELET
Eosinophils Absolute: 0.1 10*3/uL (ref 0.0–0.7)
Eosinophils Relative: 1 % (ref 0–5)
Hemoglobin: 11.8 g/dL — ABNORMAL LOW (ref 12.0–15.0)
Lymphocytes Relative: 32 % (ref 12–46)
Lymphs Abs: 1.8 10*3/uL (ref 0.7–4.0)
MCH: 32.2 pg (ref 26.0–34.0)
MCV: 94 fL (ref 78.0–100.0)
Monocytes Relative: 7 % (ref 3–12)
RBC: 3.67 MIL/uL — ABNORMAL LOW (ref 3.87–5.11)

## 2012-12-05 MED ORDER — ACETAMINOPHEN 325 MG PO TABS
650.0000 mg | ORAL_TABLET | Freq: Four times a day (QID) | ORAL | Status: DC | PRN
  Administered 2012-12-05: 650 mg via ORAL
  Filled 2012-12-05: qty 2

## 2012-12-05 MED ORDER — PROMETHAZINE HCL 25 MG/ML IJ SOLN
12.5000 mg | Freq: Once | INTRAMUSCULAR | Status: AC
  Administered 2012-12-05: 12.5 mg via INTRAVENOUS
  Filled 2012-12-05: qty 1

## 2012-12-05 MED ORDER — SODIUM CHLORIDE 0.9 % IJ SOLN
10.0000 mL | INTRAMUSCULAR | Status: DC | PRN
  Administered 2012-12-05 – 2012-12-06 (×2): 10 mL

## 2012-12-05 NOTE — Progress Notes (Signed)
Peripherally Inserted Central Catheter/Midline Placement  The IV Nurse has discussed with the patient and/or persons authorized to consent for the patient, the purpose of this procedure and the potential benefits and risks involved with this procedure.  The benefits include less needle sticks, lab draws from the catheter and patient may be discharged home with the catheter.  Risks include, but not limited to, infection, bleeding, blood clot (thrombus formation), and puncture of an artery; nerve damage and irregular heat beat.  Alternatives to this procedure were also discussed.  PICC/Midline Placement Documentation  PICC / Midline Double Lumen 12/05/12 PICC Right Basilic (Active)       Aeron Donaghey, Lajean Manes 12/05/2012, 12:59 PM

## 2012-12-05 NOTE — Progress Notes (Signed)
[facility] PROGRESS NOTE  [name] ZOX:[id number] DOB: [birth year] DOA: 12/03/2012 PCP: No primary provider on file.  Assessment/Plan:  Pyelonephritis Right sided pain with vomiting CT with mild Hydronephritis Cultures + ecoli, await sensitivities IV Rocephin started at admission   Vomiting Secondary to pyelo Zofran and phenergan IVF  Htn Moderately controlled (pain elevating) On Metoprolol 12.5 bid  Asthma Stable quiet   Code Status: Full Family Communication:  Disposition Plan: Home when ready.  Inpatient.   Consultants:    Procedures:    Antibiotics:  rocephin  HPI/Subjective: Vomiting all night  Objective: Filed Vitals:   12/04/12 2118 12/04/12 2125 12/05/12 0231 12/05/12 0438  BP: 144/85 144/85 160/100 124/79  Pulse: 71 71 71 69  Temp: 98.6 F (37 C) 98.6 F (37 C) 99.1 F (37.3 C) 98.6 F (37 C)  TempSrc: Oral Oral Oral Oral  Resp: 20 18 18 16   Height:      Weight:      SpO2: 98% 98% 99% 96%    Intake/Output Summary (Last 24 hours) at 12/05/12 0838 Last data filed at 12/05/12 0520  Gross per 24 hour  Intake 2391.66 ml  Output      0 ml  Net 2391.66 ml   Filed Weights   12/03/12 1917  Weight: 107.049 kg (236 lb)    Exam:   General:  WD, WN, AA, Female, Comfortable in bed.  Cardiovascular: rrr, with slight systolic murmur  Respiratory: cta, no w/c/r  Abdomen: obese, soft, tender on deep palpation on the right side.  No masses, +BS  Skin:  No rash, bruises, lesion  Data Reviewed: Basic Metabolic Panel:  Recent Labs Lab 12/03/12 2335 12/05/12 0518  NA 140 138  K 3.5 3.6  CL 105 104  CO2 23 24  GLUCOSE 130* 106*  BUN 9 7  CREATININE 0.80 0.84  CALCIUM 8.8 8.4   CBC:  Recent Labs Lab 12/03/12 2335 12/05/12 0518  WBC 6.5 5.5  NEUTROABS 3.8 3.2  HGB 11.5* 11.8*  HCT 33.9* 34.5*  MCV 94.4 94.0  PLT 257 280   Cardiac Enzymes: No results found for this basename: CKTOTAL, CKMB, CKMBINDEX,  TROPONINI,  in the last 168 hours BNP (last 3 results) No results found for this basename: PROBNP,  in the last 8760 hours CBG: No results found for this basename: GLUCAP,  in the last 168 hours  Recent Results (from the past 240 hour(s))  URINE CULTURE     Status: None   Collection Time    12/03/12  7:21 PM      Result Value Range Status   Specimen Description URINE, CLEAN CATCH   Final   Special Requests NONE   Final   Culture  Setup Time 12/04/2012 02:14   Final   Colony Count >=100,000 COLONIES/ML   Final   Culture ESCHERICHIA COLI   Final   Report Status PENDING   Incomplete  CULTURE, BLOOD (ROUTINE X 2)     Status: None   Collection Time    12/04/12  9:31 AM      Result Value Range Status   Specimen Description BLOOD RIGHT ARM   Final   Special Requests BOTTLES DRAWN AEROBIC ONLY 3CC   Final   Culture  Setup Time 12/04/2012 17:33   Final   Culture     Final   Value:        BLOOD CULTURE RECEIVED NO GROWTH TO DATE CULTURE WILL BE HELD FOR 5 DAYS BEFORE ISSUING A  FINAL NEGATIVE REPORT   Report Status PENDING   Incomplete  CULTURE, BLOOD (ROUTINE X 2)     Status: None   Collection Time    12/04/12 11:31 AM      Result Value Range Status   Specimen Description BLOOD LEFT FOOT   Final   Special Requests BOTTLES DRAWN AEROBIC ONLY 5CC   Final   Culture  Setup Time 12/04/2012 17:32   Final   Culture     Final   Value:        BLOOD CULTURE RECEIVED NO GROWTH TO DATE CULTURE WILL BE HELD FOR 5 DAYS BEFORE ISSUING A FINAL NEGATIVE REPORT   Report Status PENDING   Incomplete     Studies: Ct Abdomen Pelvis Wo Contrast  12/04/2012  *RADIOLOGY REPORT*  Clinical Data: Right flank pain for 2 days; nausea and vomiting.  CT ABDOMEN AND PELVIS WITHOUT CONTRAST  Technique:  Multidetector CT imaging of the abdomen and pelvis was performed following the standard protocol without intravenous contrast.  Comparison: CT of the abdomen and pelvis from 07/26/2012, and pelvic ultrasound performed  07/07/2012; renal ultrasound performed 10/14/2012  Findings: The visualized lung bases are clear.  The liver and spleen are unremarkable in appearance.  The gallbladder is within normal limits.  The pancreas and adrenal glands are unremarkable.  Right renal scarring and atrophy are seen, with scattered calcifications and apparent parapelvic cysts.  There is apparent mild right-sided hydronephrosis, less prominent than in October but more prominent than in December.  No obstructing ureteral stones are identified.  Mild nonspecific perinephric stranding is noted at the left kidney; the left kidney is otherwise unremarkable in appearance.  No free fluid is identified.  The small bowel is unremarkable in appearance.  The stomach is within normal limits.  No acute vascular abnormalities are seen.  The appendix is normal in caliber, without evidence for appendicitis.  The colon is largely decompressed and unremarkable in appearance.  The bladder is mildly distended and grossly unremarkable.  The uterus is within normal limits.  The ovaries are relatively symmetric; no suspicious adnexal masses are seen.  No inguinal lymphadenopathy is seen.  No acute osseous abnormalities are identified.  IMPRESSION:  1.  Apparent recurrent mild right-sided hydronephrosis; no obstructing ureteral stones seen.  This may reflect a recently passed stone, as the hydronephrosis is new from the prior renal ultrasound. 2.  Right renal scarring and atrophy again noted, with scattered calcifications and apparent parapelvic cysts.   Original Report Authenticated By: Tonia Ghent, M.D.     Scheduled Meds: . cefTRIAXone (ROCEPHIN)  IV  1 g Intravenous Q24H  . enoxaparin (LOVENOX) injection  40 mg Subcutaneous Q24H  . metoprolol tartrate  12.5 mg Oral BID   Continuous Infusions: . sodium chloride 100 mL/hr at 12/05/12 0865    Principal Problem:   Pyelonephritis Active Problems:   HTN (hypertension)   Asthma   Vomiting    Time  spent: 35 min    Conley Canal  [facility] Pager [phone]. If 8PM-8AM, please contact night-coverage at [email or web address], password [facility] 12/05/2012, 8:38 AM  LOS: 2 days   Attending Patient seen and examined, still with some nausea and vomiting. Still with some Right CVA tenderness. Continue IV Rocephin. Needs central venous access. Agree with assessment and plan.  S Giordano Getman

## 2012-12-06 DIAGNOSIS — J45909 Unspecified asthma, uncomplicated: Secondary | ICD-10-CM

## 2012-12-06 DIAGNOSIS — R1110 Vomiting, unspecified: Secondary | ICD-10-CM

## 2012-12-06 DIAGNOSIS — I10 Essential (primary) hypertension: Secondary | ICD-10-CM

## 2012-12-06 DIAGNOSIS — N12 Tubulo-interstitial nephritis, not specified as acute or chronic: Principal | ICD-10-CM

## 2012-12-06 LAB — URINE CULTURE: Colony Count: >=100000

## 2012-12-06 MED ORDER — HYDROCODONE-ACETAMINOPHEN 5-325 MG PO TABS
1.0000 | ORAL_TABLET | Freq: Four times a day (QID) | ORAL | Status: DC | PRN
  Administered 2012-12-06 – 2012-12-07 (×3): 1 via ORAL
  Filled 2012-12-06 (×3): qty 1

## 2012-12-06 MED ORDER — ENSURE COMPLETE PO LIQD: 237.0000 mL | ORAL | Status: DC | PRN

## 2012-12-06 MED ORDER — CIPROFLOXACIN IN D5W 400 MG/200ML IV SOLN
400.0000 mg | Freq: Two times a day (BID) | INTRAVENOUS | Status: DC
  Administered 2012-12-06 (×2): 400 mg via INTRAVENOUS
  Filled 2012-12-06 (×4): qty 200

## 2012-12-06 MED ORDER — OXYCODONE HCL 5 MG PO TABS: 5.0000 mg | ORAL_TABLET | Freq: Four times a day (QID) | ORAL | Status: DC | PRN

## 2012-12-06 NOTE — Care Management Note (Addendum)
    Page 1 of 1   12/07/2012     9:42:24 AM   CARE MANAGEMENT NOTE 12/07/2012  Patient:  [name], [name]   Account Number:  [id number]  Date Initiated:  12/06/2012  Documentation initiated by:  Letha Cape  Subjective/Objective Assessment:   dx pyelonephritis  admit- lives with spouse. pta independent.     Action/Plan:   Anticipated DC Date:  12/07/2012   Anticipated DC Plan:  HOME/SELF CARE      DC Planning Services  CM consult      Choice offered to / List presented to:             Status of service:  Completed, signed off Medicare Important Message given?   (If response is "NO", the following Medicare IM given date fields will be blank) Date Medicare IM given:   Date Additional Medicare IM given:    Discharge Disposition:  HOME/SELF CARE  Per UR Regulation:  Reviewed for med. necessity/level of care/duration of stay  If discussed at Long Length of Stay Meetings, dates discussed:    Comments:  12/07/12 9:41 Letha Cape RN, BSN [phone] patient is for dc today.  12/06/12 9;58 Letha Cape RN, BSN [phone] patient lives with spouse, pta indep.  Patient states she has [employer] , her spouse will bring card when he comes to hospital today.  Patient has transportation as well.  No needs anticipated.

## 2012-12-06 NOTE — Progress Notes (Signed)
INITIAL NUTRITION ASSESSMENT  DOCUMENTATION CODES Per approved criteria  -Obesity Unspecified   INTERVENTION: 1. Ensure Complete po PRN, each supplement provides 350 kcal and 13 grams of protein.  2. RD will continue to follow    NUTRITION DIAGNOSIS: Inadequate oral intake related to N/V as evidenced by RN report.   Goal: PO intake to meet >/=90% estimated nutrition needs  Monitor:  PO intake, weight trends, I/O's, labs  Reason for Assessment: Poor Po intake   37 y.o. female  Admitting Dx: Pyelonephritis  ASSESSMENT: RN notified RD that pt was not able to eat meals yesterday, but was requesting Ensure supplements.  Pt with several admissions with similar problems.   Height: Ht Readings from Last 1 Encounters:  12/03/12 5\' 5"  (1.651 m)    Weight: Wt Readings from Last 1 Encounters:  12/03/12 236 lb (107.049 kg)    Ideal Body Weight: 125 lbs   % Ideal Body Weight: 189%  Wt Readings from Last 10 Encounters:  12/03/12 236 lb (107.049 kg)  07/26/12 228 lb (103.42 kg)  07/07/12 230 lb (104.327 kg)  04/20/12 241 lb 6.5 oz (109.5 kg)  06/18/11 212 lb (96.163 kg)  05/13/11 212 lb (96.163 kg)    Usual Body Weight: 230 lbs   % Usual Body Weight: 103%  BMI:  Body mass index is 39.27 kg/(m^2). Obesity class 2  Estimated Nutritional Needs: Kcal: 2000-2200 Protein: 70-80 gm  Fluid: 2-2.2 L/day  Skin: intact   Diet Order: General  EDUCATION NEEDS: -No education needs identified at this time   Intake/Output Summary (Last 24 hours) at 12/06/12 0915 Last data filed at 12/06/12 9147  Gross per 24 hour  Intake 3838.67 ml  Output      0 ml  Net 3838.67 ml    Last BM: PTA   Labs:   Recent Labs Lab 12/03/12 2335 12/05/12 0518  NA 140 138  K 3.5 3.6  CL 105 104  CO2 23 24  BUN 9 7  CREATININE 0.80 0.84  CALCIUM 8.8 8.4  GLUCOSE 130* 106*    CBG (last 3)  No results found for this basename: GLUCAP,  in the last 72 hours  Scheduled Meds: .  ciprofloxacin  400 mg Intravenous Q12H  . enoxaparin (LOVENOX) injection  40 mg Subcutaneous Q24H  . metoprolol tartrate  12.5 mg Oral BID    Continuous Infusions: . sodium chloride 100 mL/hr at 12/06/12 8295    Past Medical History  Diagnosis Date  . Hypertension   . Chronic UTI   . Complication of anesthesia   . Asthma   . Kidney stones   . Pyelonephritis, chronic   . Ovarian cyst   . Bacterial vaginosis   . Candidal vaginitis     Past Surgical History  Procedure Laterality Date  . Lithotripsy  2000's  . Cystoscopy  2001-2012    "total of 11; for chronic chronic pyelonephritis"  . Tubal ligation  2002  . Nephrostomy      Clarene Duke RD, LDN Pager [phone] After Hours pager [phone]

## 2012-12-06 NOTE — Progress Notes (Signed)
[facility] PROGRESS NOTE  [name] NFA:[id number] DOB: Nov 14, 1975 DOA: 12/03/2012 PCP: Doreen Salvage, [place].  Assessment/Plan:  Pyelonephritis Right sided pain with vomiting - improving. CT with mild Hydronephritis Cultures + ecoli, sensitive to Cipro IV Rocephin started at admission.  Changed to Cipro 2/18  Vomiting Secondary to pyelo 1 episode vomiting overnight. Zofran and phenergan Discontinued IVF.  Trial solid diet.  Htn Moderately controlled (pain elevating) On Metoprolol 12.5 bid  Asthma Stable quiet   Code Status: Full Family Communication:  Disposition Plan: Home when ready.  Inpatient.   Antibiotics:  Rocephin 2/15 - 2/18  Cipro 2/18  HPI/Subjective: Pain improving.  Trying to eat.  Objective: Filed Vitals:   12/05/12 1413 12/05/12 2231 12/05/12 2300 12/06/12 0606  BP: 136/86 154/106 140/90 132/87  Pulse: 70 73  66  Temp: 98.2 F (36.8 C) 99 F (37.2 C)  97.9 F (36.6 C)  TempSrc: Oral Oral  Oral  Resp: 20 20  18   Height:      Weight:      SpO2: 97% 97%  98%    Intake/Output Summary (Last 24 hours) at 12/06/12 1131 Last data filed at 12/06/12 0857  Gross per 24 hour  Intake 3501.67 ml  Output      0 ml  Net 3501.67 ml   Filed Weights   12/03/12 1917  Weight: 107.049 kg (236 lb)    Exam:   General:  WD, WN, AA, Female, Comfortable in bed.  Cardiovascular: rrr, with slight systolic murmur  Respiratory: cta, no w/c/r  Abdomen: obese, soft, tender on deep palpation on the right side.  No masses, +BS, decreased CVA tenderness on right  Skin:  No rash, bruises, lesion  Extremities:  PICC line placed in right arm.  O/w no c/c/e  Data Reviewed: Basic Metabolic Panel:  Recent Labs Lab 12/03/12 2335 12/05/12 0518  NA 140 138  K 3.5 3.6  CL 105 104  CO2 23 24  GLUCOSE 130* 106*  BUN 9 7  CREATININE 0.80 0.84  CALCIUM 8.8 8.4   CBC:  Recent Labs Lab 12/03/12 2335 12/05/12 0518  WBC 6.5 5.5   NEUTROABS 3.8 3.2  HGB 11.5* 11.8*  HCT 33.9* 34.5*  MCV 94.4 94.0  PLT 257 280     Recent Results (from the past 240 hour(s))  URINE CULTURE     Status: None   Collection Time    12/03/12  7:21 PM      Result Value Range Status   Specimen Description URINE, CLEAN CATCH   Final   Special Requests NONE   Final   Culture  Setup Time 12/04/2012 02:14   Final   Colony Count >=100,000 COLONIES/ML   Final   Culture ESCHERICHIA COLI   Final   Report Status 12/06/2012 FINAL   Final   Organism ID, Bacteria ESCHERICHIA COLI   Final  CULTURE, BLOOD (ROUTINE X 2)     Status: None   Collection Time    12/04/12  9:31 AM      Result Value Range Status   Specimen Description BLOOD RIGHT ARM   Final   Special Requests BOTTLES DRAWN AEROBIC ONLY 3CC   Final   Culture  Setup Time 12/04/2012 17:33   Final   Culture     Final   Value:        BLOOD CULTURE RECEIVED NO GROWTH TO DATE CULTURE WILL BE HELD FOR 5 DAYS BEFORE ISSUING A FINAL NEGATIVE REPORT   Report  Status PENDING   Incomplete  CULTURE, BLOOD (ROUTINE X 2)     Status: None   Collection Time    12/04/12 11:31 AM      Result Value Range Status   Specimen Description BLOOD LEFT FOOT   Final   Special Requests BOTTLES DRAWN AEROBIC ONLY 5CC   Final   Culture  Setup Time 12/04/2012 17:32   Final   Culture     Final   Value:        BLOOD CULTURE RECEIVED NO GROWTH TO DATE CULTURE WILL BE HELD FOR 5 DAYS BEFORE ISSUING A FINAL NEGATIVE REPORT   Report Status PENDING   Incomplete     Studies: No results found.  Scheduled Meds: . ciprofloxacin  400 mg Intravenous Q12H  . enoxaparin (LOVENOX) injection  40 mg Subcutaneous Q24H  . metoprolol tartrate  12.5 mg Oral BID   Continuous Infusions:    Principal Problem:   Pyelonephritis Active Problems:   HTN (hypertension)   Asthma   Vomiting    Time spent: 35 min    Conley Canal  [facility] Pager [phone]. If 8PM-8AM, please contact night-coverage at  [email or web address], password [facility] 12/06/2012, 11:31 AM  LOS: 3 days    Attending Patient seen and examined, agree with assessment and plan. Still with some nausea, Diovan advanced, suspect she will require one more day prior to discharge.  Windell Norfolk MD

## 2012-12-07 MED ORDER — CIPROFLOXACIN HCL 750 MG PO TABS: 750.0000 mg | ORAL_TABLET | Freq: Two times a day (BID) | ORAL | Status: DC

## 2012-12-07 MED ORDER — FLUCONAZOLE 150 MG PO TABS: 150.0000 mg | ORAL_TABLET | Freq: Once | ORAL | Status: DC

## 2012-12-07 MED ORDER — HYDROCODONE-ACETAMINOPHEN 5-325 MG PO TABS: 1.0000 | ORAL_TABLET | Freq: Four times a day (QID) | ORAL | Status: DC | PRN

## 2012-12-07 MED ORDER — ONDANSETRON HCL 4 MG PO TABS: 4.0000 mg | ORAL_TABLET | Freq: Four times a day (QID) | ORAL | Status: DC | PRN

## 2012-12-07 NOTE — Progress Notes (Signed)
Patient discharge teaching given, including activity, diet, follow-up appoints, and medications. Patient verbalized understanding of all discharge instructions. IV access was d/c'd by IV team. Vitals are stable. Skin is intact except as charted in most recent assessments. Pt to be escorted out by NT, to be driven home by family.

## 2012-12-07 NOTE — Discharge Summary (Signed)
Addendum  Patient seen and examined, chart and data base reviewed.  I agree with the above assessment and discharge plan.  For full details please see Mrs. Algis Downs PA note.  Acute pyelonephritis.   Clint Lipps, MD [facility] Pager: [phone] 12/07/2012, 4:59 PM

## 2012-12-07 NOTE — Discharge Summary (Signed)
Physician Discharge Summary  [name] RUE:[id number] DOB: 1976/02/22 DOA: 12/03/2012  PCP: Nancy Salvage, MD  Admit date: 12/03/2012 Discharge date: 12/07/2012  Time spent: 40 minutes Recommendations for Outpatient Follow-up:  See Dr. Jacqlyn Hogan for hospital followup in one to 2 weeks.  Specifically to ensure her urinary tract infection has resolved and blood pressure is controlled.  Discharge Diagnoses:  Principal Problem:   Pyelonephritis Active Problems:   HTN (hypertension)   Asthma   Vomiting   Discharge Condition: Stable  Diet recommendation: Heart healthy  Filed Weights   12/03/12 1917  Weight: 107.049 kg (236 lb)    History of present illness:  [name] is a 37 year old female with a history of chronic UTI, kidney stones and pyelonephritis who presented to the [facility] emergency department complaining of right flank pain which began 2 days ago. The flank pain was associated with nausea vomiting and odorous urine. She was transferred to [facility] for admission because of her inability to take by mouth fluids.  Hospital Course:   Pyelonephritis Initially the patient had fever after admission. She was started on IV antibiotics. Her CT showed mild hydronephrosis. She was vomiting unable to keep down P O.s. Over the course of the next 48 hours the patient slowly improved. She was progressed from clear liquids to a solid diet. She was also progressed from IV pain medications to oral Vicodin. Urine cultures showed Escherichia coli that was sensitive to Cipro. It was decided that as this patient has recurrent urinary tract infections she would be treated with a ten-day course of antibiotics. She received 3 days of IV antibiotics in the hospital. She was discharged with 7 more days of oral Cipro twice a day. We recommend primary care followup to ensure her infection has resolved.  vomiting Due to pyelonephritis. She was treated supportively with IV fluids Zofran  and Phenergan. Her diet was slowly able to be advanced. She was able to tolerate solid food and no longer vomiting at the time of discharge.  Hypertension Moderately controlled on metoprolol 12.5 mg twice a day. It was felt that the patient's blood pressure was likely elevated due to to pain and consequently her blood pressure medication was not changed. We recommend that she follow up with her primary care physician to determine if her blood pressure medication needs to be increased.  Asthma The patient has a history of asthma, but it was not active during this admission.  Yeast infection The patient was concerned she would develop a yeast infection after being treated with antibiotics. She was given a prescription for a single dose of Diflucan to be filled after she completed her Cipro.    Discharge Exam: Filed Vitals:   12/06/12 1430 12/06/12 2104 12/07/12 0527 12/07/12 0925  BP: 153/95 149/85 146/85 128/87  Pulse: 66 70 64   Temp: 98.8 F (37.1 C) 98.5 F (36.9 C) 98.4 F (36.9 C)   TempSrc: Oral Oral Oral   Resp: 20 18 20    Height:      Weight:      SpO2: 98% 94% 97%     General: Well-developed well-nourished African American female, up walking around the room Cardiovascular: Regular rate and rhythm, no murmurs rubs or gallops Respiratory: Clear to auscultation, no wheezes crackles or rales Abdomen: Obese, soft, nontender, nondistended, positive bowel sounds, decreased CVA tenderness on the right. Extremities: No swelling cyanosis or edema  Discharge Instructions  Discharge Orders   Future Orders Complete By Expires  Diet - low sodium heart healthy  As directed     Increase activity slowly  As directed         Medication List    TAKE these medications       albuterol 108 (90 BASE) MCG/ACT inhaler  Commonly known as:  PROVENTIL HFA;VENTOLIN HFA  Inhale 2 puffs into the lungs every 6 (six) hours as needed for wheezing. For wheezing     BIOTIN PO  Take 1  tablet by mouth daily.     ciprofloxacin 750 MG tablet  Commonly known as:  CIPRO  Take 1 tablet (750 mg total) by mouth 2 (two) times daily.     fluconazole 150 MG tablet  Commonly known as:  DIFLUCAN  Take 1 tablet (150 mg total) by mouth once.     HYDROcodone-acetaminophen 5-325 MG per tablet  Commonly known as:  NORCO/VICODIN  Take 1 tablet by mouth every 6 (six) hours as needed.     metoprolol tartrate 25 MG tablet  Commonly known as:  LOPRESSOR  Take 12.5 mg by mouth 2 (two) times daily.     ondansetron 4 MG tablet  Commonly known as:  ZOFRAN  Take 1 tablet (4 mg total) by mouth every 6 (six) hours as needed for nausea.           Follow-up Information   Follow up with Hogan, DONALD, MD. Schedule an appointment as soon as possible for a visit in 1 week.   Contact information:   [address] [place] [address] [phone]        The results of significant diagnostics from this hospitalization (including imaging, microbiology, ancillary and laboratory) are listed below for reference.    Significant Diagnostic Studies: Ct Abdomen Pelvis Wo Contrast  12/04/2012  *RADIOLOGY REPORT*  Clinical Data: Right flank pain for 2 days; nausea and vomiting.  CT ABDOMEN AND PELVIS WITHOUT CONTRAST  Technique:  Multidetector CT imaging of the abdomen and pelvis was performed following the standard protocol without intravenous contrast.  Comparison: CT of the abdomen and pelvis from 07/26/2012, and pelvic ultrasound performed 07/07/2012; renal ultrasound performed 10/14/2012  Findings: The visualized lung bases are clear.  The liver and spleen are unremarkable in appearance.  The gallbladder is within normal limits.  The pancreas and adrenal glands are unremarkable.  Right renal scarring and atrophy are seen, with scattered calcifications and apparent parapelvic cysts.  There is apparent mild right-sided hydronephrosis, less prominent than in October but more prominent than in  December.  No obstructing ureteral stones are identified.  Mild nonspecific perinephric stranding is noted at the left kidney; the left kidney is otherwise unremarkable in appearance.  No free fluid is identified.  The small bowel is unremarkable in appearance.  The stomach is within normal limits.  No acute vascular abnormalities are seen.  The appendix is normal in caliber, without evidence for appendicitis.  The colon is largely decompressed and unremarkable in appearance.  The bladder is mildly distended and grossly unremarkable.  The uterus is within normal limits.  The ovaries are relatively symmetric; no suspicious adnexal masses are seen.  No inguinal lymphadenopathy is seen.  No acute osseous abnormalities are identified.  IMPRESSION:  1.  Apparent recurrent mild right-sided hydronephrosis; no obstructing ureteral stones seen.  This may reflect a recently passed stone, as the hydronephrosis is new from the prior renal ultrasound. 2.  Right renal scarring and atrophy again noted, with scattered calcifications and apparent parapelvic cysts.  Original Report Authenticated By: Tonia Ghent, M.D.     Microbiology: Recent Results (from the past 240 hour(s))  URINE CULTURE     Status: None   Collection Time    12/03/12  7:21 PM      Result Value Range Status   Specimen Description URINE, CLEAN CATCH   Final   Special Requests NONE   Final   Culture  Setup Time 12/04/2012 02:14   Final   Colony Count >=100,000 COLONIES/ML   Final   Culture ESCHERICHIA COLI   Final   Report Status 12/06/2012 FINAL   Final   Organism ID, Bacteria ESCHERICHIA COLI   Final  CULTURE, BLOOD (ROUTINE X 2)     Status: None   Collection Time    12/04/12  9:31 AM      Result Value Range Status   Specimen Description BLOOD RIGHT ARM   Final   Special Requests BOTTLES DRAWN AEROBIC ONLY 3CC   Final   Culture  Setup Time 12/04/2012 17:33   Final   Culture     Final   Value:        BLOOD CULTURE RECEIVED NO GROWTH TO  DATE CULTURE WILL BE HELD FOR 5 DAYS BEFORE ISSUING A FINAL NEGATIVE REPORT   Report Status PENDING   Incomplete  CULTURE, BLOOD (ROUTINE X 2)     Status: None   Collection Time    12/04/12 11:31 AM      Result Value Range Status   Specimen Description BLOOD LEFT FOOT   Final   Special Requests BOTTLES DRAWN AEROBIC ONLY 5CC   Final   Culture  Setup Time 12/04/2012 17:32   Final   Culture     Final   Value:        BLOOD CULTURE RECEIVED NO GROWTH TO DATE CULTURE WILL BE HELD FOR 5 DAYS BEFORE ISSUING A FINAL NEGATIVE REPORT   Report Status PENDING   Incomplete     Labs: Basic Metabolic Panel:  Recent Labs Lab 12/03/12 2335 12/05/12 0518  NA 140 138  K 3.5 3.6  CL 105 104  CO2 23 24  GLUCOSE 130* 106*  BUN 9 7  CREATININE 0.80 0.84  CALCIUM 8.8 8.4   CBC:  Recent Labs Lab 12/03/12 2335 12/05/12 0518  WBC 6.5 5.5  NEUTROABS 3.8 3.2  HGB 11.5* 11.8*  HCT 33.9* 34.5*  MCV 94.4 94.0  PLT 257 280    SignedStephani Police, P.A.-C. [phone]  [facility] 12/07/2012, 3:50 PM

## 2012-12-10 LAB — CULTURE, BLOOD (ROUTINE X 2): Culture: NO GROWTH

## 2013-03-24 ENCOUNTER — Emergency Department (HOSPITAL_BASED_OUTPATIENT_CLINIC_OR_DEPARTMENT_OTHER)
Admission: EM | Admit: 2013-03-24 | Discharge: 2013-03-24 | Disposition: A | Discharge status: Other Acute Inpatient Hospital | Payer: BC Managed Care – PPO | Attending: Emergency Medicine | Admitting: Emergency Medicine

## 2013-03-24 ENCOUNTER — Encounter (HOSPITAL_BASED_OUTPATIENT_CLINIC_OR_DEPARTMENT_OTHER): Payer: Self-pay | Admitting: Student

## 2013-03-24 DIAGNOSIS — Z3202 Encounter for pregnancy test, result negative: Secondary | ICD-10-CM | POA: Insufficient documentation

## 2013-03-24 DIAGNOSIS — R109 Unspecified abdominal pain: Secondary | ICD-10-CM | POA: Insufficient documentation

## 2013-03-24 DIAGNOSIS — Z79899 Other long term (current) drug therapy: Secondary | ICD-10-CM | POA: Insufficient documentation

## 2013-03-24 DIAGNOSIS — R300 Dysuria: Secondary | ICD-10-CM | POA: Insufficient documentation

## 2013-03-24 DIAGNOSIS — Z87442 Personal history of urinary calculi: Secondary | ICD-10-CM | POA: Insufficient documentation

## 2013-03-24 DIAGNOSIS — Z8619 Personal history of other infectious and parasitic diseases: Secondary | ICD-10-CM | POA: Insufficient documentation

## 2013-03-24 DIAGNOSIS — N12 Tubulo-interstitial nephritis, not specified as acute or chronic: Secondary | ICD-10-CM

## 2013-03-24 DIAGNOSIS — R112 Nausea with vomiting, unspecified: Secondary | ICD-10-CM | POA: Insufficient documentation

## 2013-03-24 DIAGNOSIS — Z9851 Tubal ligation status: Secondary | ICD-10-CM | POA: Insufficient documentation

## 2013-03-24 DIAGNOSIS — F17200 Nicotine dependence, unspecified, uncomplicated: Secondary | ICD-10-CM | POA: Insufficient documentation

## 2013-03-24 DIAGNOSIS — Z8742 Personal history of other diseases of the female genital tract: Secondary | ICD-10-CM | POA: Insufficient documentation

## 2013-03-24 DIAGNOSIS — J45909 Unspecified asthma, uncomplicated: Secondary | ICD-10-CM | POA: Insufficient documentation

## 2013-03-24 DIAGNOSIS — Z87440 Personal history of urinary (tract) infections: Secondary | ICD-10-CM | POA: Insufficient documentation

## 2013-03-24 DIAGNOSIS — I10 Essential (primary) hypertension: Secondary | ICD-10-CM | POA: Insufficient documentation

## 2013-03-24 DIAGNOSIS — R3915 Urgency of urination: Secondary | ICD-10-CM | POA: Insufficient documentation

## 2013-03-24 LAB — URINALYSIS, ROUTINE W REFLEX MICROSCOPIC
Ketones, ur: NEGATIVE mg/dL
Nitrite: POSITIVE — AB
Protein, ur: 30 mg/dL — AB
Urobilinogen, UA: 0.2 mg/dL (ref 0.0–1.0)

## 2013-03-24 LAB — CBC WITH DIFFERENTIAL/PLATELET
Basophils Absolute: 0 10*3/uL (ref 0.0–0.1)
Basophils Relative: 0 % (ref 0–1)
HCT: 37.7 % (ref 36.0–46.0)
MCHC: 33.7 g/dL (ref 30.0–36.0)
Monocytes Absolute: 0.4 10*3/uL (ref 0.1–1.0)
Neutro Abs: 3.1 10*3/uL (ref 1.7–7.7)
Neutrophils Relative %: 53 % (ref 43–77)
Platelets: 301 10*3/uL (ref 150–400)
RDW: 12.4 % (ref 11.5–15.5)

## 2013-03-24 LAB — BASIC METABOLIC PANEL
Chloride: 101 mEq/L (ref 96–112)
Creatinine, Ser: 1 mg/dL (ref 0.50–1.10)
GFR calc Af Amer: 84 mL/min — ABNORMAL LOW (ref >90)

## 2013-03-24 LAB — URINE MICROSCOPIC-ADD ON

## 2013-03-24 LAB — PREGNANCY, URINE: Preg Test, Ur: NEGATIVE

## 2013-03-24 MED ORDER — SODIUM CHLORIDE 0.9 % IV BOLUS (SEPSIS)
1000.0000 mL | Freq: Once | INTRAVENOUS | Status: AC
  Administered 2013-03-24: 1000 mL via INTRAVENOUS

## 2013-03-24 MED ORDER — PROMETHAZINE HCL 25 MG/ML IJ SOLN
12.5000 mg | Freq: Four times a day (QID) | INTRAMUSCULAR | Status: DC | PRN
  Administered 2013-03-24 (×2): 12.5 mg via INTRAVENOUS
  Filled 2013-03-24 (×3): qty 1

## 2013-03-24 MED ORDER — ONDANSETRON HCL 4 MG/2ML IJ SOLN
4.0000 mg | Freq: Once | INTRAMUSCULAR | Status: AC
  Administered 2013-03-24: 4 mg via INTRAVENOUS
  Filled 2013-03-24: qty 2

## 2013-03-24 MED ORDER — MORPHINE SULFATE 4 MG/ML IJ SOLN
4.0000 mg | Freq: Once | INTRAMUSCULAR | Status: AC
  Administered 2013-03-24: 4 mg via INTRAVENOUS
  Filled 2013-03-24: qty 1

## 2013-03-24 MED ORDER — DEXTROSE 5 % IV SOLN
1.0000 g | Freq: Once | INTRAVENOUS | Status: AC
  Administered 2013-03-24: 1 g via INTRAVENOUS
  Filled 2013-03-24: qty 10

## 2013-03-24 NOTE — ED Provider Notes (Signed)
History     CSN: [id number]  Arrival date & time 03/24/13  0730   First MD Initiated Contact with Patient 03/24/13 0800      Chief Complaint  Patient presents with  . Back Pain    increased pain over right flank area    (Consider location/radiation/quality/duration/timing/severity/associated sxs/prior treatment) HPI Comments: Patient presents with right flank pain. She's had a similar presentation with pyelonephritis in the past. She complains of four-day history of worsening pain to her right flank. It radiates to her right lower quadrant. She started having some nausea and vomiting yesterday. She denies any fevers or chills. She does have some urgency on urination. She denies any vaginal bleeding or discharge. She currently is not taking any medications at home for the symptoms.  Patient is a 37 y.o. female presenting with back pain.  Back Pain Associated symptoms: abdominal pain and dysuria   Associated symptoms: no chest pain, no fever, no headaches, no numbness and no weakness     Past Medical History  Diagnosis Date  . Hypertension   . Chronic UTI   . Complication of anesthesia   . Asthma   . Kidney stones   . Pyelonephritis, chronic   . Ovarian cyst   . Bacterial vaginosis   . Candidal vaginitis     Past Surgical History  Procedure Laterality Date  . Lithotripsy  2000's  . Cystoscopy  2001-2012    "total of 11; for chronic chronic pyelonephritis"  . Tubal ligation  2002  . Nephrostomy      History reviewed. No pertinent family history.  History  Substance Use Topics  . Smoking status: Current Every Day Smoker -- 0.25 packs/day for 18 years    Types: Cigarettes  . Smokeless tobacco: Never Used  . Alcohol Use: No    OB History   Grav Para Term Preterm Abortions TAB SAB Ect Mult Living                  Review of Systems  Constitutional: Negative for fever, chills, diaphoresis and fatigue.  HENT: Negative for congestion, rhinorrhea and sneezing.    Eyes: Negative.   Respiratory: Negative for cough, chest tightness and shortness of breath.   Cardiovascular: Negative for chest pain and leg swelling.  Gastrointestinal: Positive for nausea, vomiting and abdominal pain. Negative for diarrhea and blood in stool.  Genitourinary: Positive for dysuria, urgency and flank pain. Negative for frequency, hematuria and difficulty urinating.  Musculoskeletal: Positive for back pain. Negative for arthralgias.  Skin: Negative for rash.  Neurological: Negative for dizziness, speech difficulty, weakness, numbness and headaches.    Allergies  Flagyl; Ketorolac tromethamine; Metoclopramide hcl; and Percocet  Home Medications   Current Outpatient Rx  Name  Route  Sig  Dispense  Refill  . hydrochlorothiazide (HYDRODIURIL) 12.5 MG tablet   Oral   Take 12.5 mg by mouth daily.         . metoprolol succinate (TOPROL-XL) 25 MG 24 hr tablet   Oral   Take 25 mg by mouth daily.         Marland Kitchen zolpidem (AMBIEN) 5 MG tablet   Oral   Take 5 mg by mouth at bedtime as needed for sleep.         Marland Kitchen albuterol (PROVENTIL HFA;VENTOLIN HFA) 108 (90 BASE) MCG/ACT inhaler   Inhalation   Inhale 2 puffs into the lungs every 6 (six) hours as needed for wheezing. For wheezing         .  BIOTIN PO   Oral   Take 1 tablet by mouth daily.         . ciprofloxacin (CIPRO) 750 MG tablet   Oral   Take 1 tablet (750 mg total) by mouth 2 (two) times daily.   14 tablet   0   . fluconazole (DIFLUCAN) 150 MG tablet   Oral   Take 1 tablet (150 mg total) by mouth once.   1 tablet   0   . HYDROcodone-acetaminophen (NORCO/VICODIN) 5-325 MG per tablet   Oral   Take 1 tablet by mouth every 6 (six) hours as needed.   20 tablet   0   . metoprolol tartrate (LOPRESSOR) 25 MG tablet   Oral   Take 12.5 mg by mouth 2 (two) times daily.         . ondansetron (ZOFRAN) 4 MG tablet   Oral   Take 1 tablet (4 mg total) by mouth every 6 (six) hours as needed for nausea.    20 tablet   0     BP 109/71  Pulse 92  Temp(Src) 98.6 F (37 C) (Oral)  Resp 16  Ht 5\' 5"  (1.651 m)  Wt 227 lb (102.967 kg)  BMI 37.77 kg/m2  SpO2 99%  Physical Exam  Constitutional: She is oriented to person, place, and time. She appears well-developed and well-nourished.  HENT:  Head: Normocephalic and atraumatic.  Mouth/Throat: Oropharynx is clear and moist.  Eyes: Pupils are equal, round, and reactive to light.  Neck: Normal range of motion. Neck supple.  Cardiovascular: Normal rate, regular rhythm and normal heart sounds.   Pulmonary/Chest: Effort normal and breath sounds normal. No respiratory distress. She has no wheezes. She has no rales. She exhibits no tenderness.  Abdominal: Soft. Bowel sounds are normal. There is tenderness (Moderate tenderness to the right mid and lower abdomen. Mild right CVA tenderness). There is no rebound and no guarding.  Musculoskeletal: Normal range of motion. She exhibits no edema.  Lymphadenopathy:    She has no cervical adenopathy.  Neurological: She is alert and oriented to person, place, and time.  Skin: Skin is warm and dry. No rash noted.  Psychiatric: She has a normal mood and affect.    ED Course  Procedures (including critical care time)  Results for orders placed during the hospital encounter of 03/24/13  URINALYSIS, ROUTINE W REFLEX MICROSCOPIC      Result Value Range   Color, Urine AMBER (*) YELLOW   APPearance TURBID (*) CLEAR   Specific Gravity, Urine 1.025  1.005 - 1.030   pH 7.0  5.0 - 8.0   Glucose, UA NEGATIVE  NEGATIVE mg/dL   Hgb urine dipstick TRACE (*) NEGATIVE   Bilirubin Urine NEGATIVE  NEGATIVE   Ketones, ur NEGATIVE  NEGATIVE mg/dL   Protein, ur 30 (*) NEGATIVE mg/dL   Urobilinogen, UA 0.2  0.0 - 1.0 mg/dL   Nitrite POSITIVE (*) NEGATIVE   Leukocytes, UA LARGE (*) NEGATIVE  CBC WITH DIFFERENTIAL      Result Value Range   WBC 5.8  4.0 - 10.5 K/uL   RBC 3.99  3.87 - 5.11 MIL/uL   Hemoglobin 12.7  12.0  - 15.0 g/dL   HCT 40.9  81.1 - 91.4 %   MCV 94.5  78.0 - 100.0 fL   MCH 31.8  26.0 - 34.0 pg   MCHC 33.7  30.0 - 36.0 g/dL   RDW 78.2  95.6 - 21.3 %   Platelets 301  150 - 400 K/uL   Neutrophils Relative % 53  43 - 77 %   Neutro Abs 3.1  1.7 - 7.7 K/uL   Lymphocytes Relative 39  12 - 46 %   Lymphs Abs 2.3  0.7 - 4.0 K/uL   Monocytes Relative 6  3 - 12 %   Monocytes Absolute 0.4  0.1 - 1.0 K/uL   Eosinophils Relative 1  0 - 5 %   Eosinophils Absolute 0.1  0.0 - 0.7 K/uL   Basophils Relative 0  0 - 1 %   Basophils Absolute 0.0  0.0 - 0.1 K/uL  BASIC METABOLIC PANEL      Result Value Range   Sodium 138  135 - 145 mEq/L   Potassium 3.6  3.5 - 5.1 mEq/L   Chloride 101  96 - 112 mEq/L   CO2 26  19 - 32 mEq/L   Glucose, Bld 111 (*) 70 - 99 mg/dL   BUN 10  6 - 23 mg/dL   Creatinine, Ser 1.61  0.50 - 1.10 mg/dL   Calcium 9.3  8.4 - 09.6 mg/dL   GFR calc non Af Amer 72 (*) >90 mL/min   GFR calc Af Amer 84 (*) >90 mL/min  PREGNANCY, URINE      Result Value Range   Preg Test, Ur NEGATIVE  NEGATIVE  URINE MICROSCOPIC-ADD ON      Result Value Range   Squamous Epithelial / LPF RARE  RARE   WBC, UA 7-10  <3 WBC/hpf   RBC / HPF 0-2  <3 RBC/hpf   Bacteria, UA MANY (*) RARE   No results found.    1. Pyelonephritis       MDM  Patient with symptoms consistent with pyelonephritis. She states the symptoms are consistent with her past is at the pyelonephritis. She's had recent imaging which did not show any kidney stones. At this point I did not feel that brain imaging was indicated. She's given dose of IV Rocephin. She's also given IV fluids. She had ongoing nausea and vomiting even after 2 doses of Zofran and 2 doses of Phenergan. Given this I feel that she needs to be admitted. The patient had requested to go to [facility] and the patient was accepted by Dr. Macky Lower. I reviewed old records and her last urine culture grew out Escherichia coli which was sensitive to  cephalosporins.        Rolan Bucco, MD 03/24/13 1311

## 2013-03-24 NOTE — ED Notes (Addendum)
Pt in with c/o lower back pain x 4 days with majority of pain focused over right flank. Reports increased urinary frequency but denies hematuria or pain with urination. + N V but no active V at present time. Reports foul smelling urine and prior + hx of chronic UTI. Pt reports needing referral for Urology.

## 2013-03-24 NOTE — ED Notes (Signed)
Pt OOB to restroom, ambulates well. Gait steady.

## 2013-03-24 NOTE — ED Notes (Signed)
Patient is resting comfortably. 

## 2013-03-25 LAB — URINE CULTURE: Colony Count: 75000

## 2013-10-26 ENCOUNTER — Emergency Department (HOSPITAL_BASED_OUTPATIENT_CLINIC_OR_DEPARTMENT_OTHER)
Admission: EM | Admit: 2013-10-26 | Discharge: 2013-10-26 | Disposition: A | Discharge status: Home / Self Care | Payer: Medicaid Other | Attending: Emergency Medicine | Admitting: Emergency Medicine

## 2013-10-26 ENCOUNTER — Emergency Department (HOSPITAL_BASED_OUTPATIENT_CLINIC_OR_DEPARTMENT_OTHER): Payer: Medicaid Other

## 2013-10-26 ENCOUNTER — Encounter (HOSPITAL_BASED_OUTPATIENT_CLINIC_OR_DEPARTMENT_OTHER): Payer: Self-pay | Admitting: Emergency Medicine

## 2013-10-26 DIAGNOSIS — F17200 Nicotine dependence, unspecified, uncomplicated: Secondary | ICD-10-CM | POA: Insufficient documentation

## 2013-10-26 DIAGNOSIS — J45909 Unspecified asthma, uncomplicated: Secondary | ICD-10-CM | POA: Insufficient documentation

## 2013-10-26 DIAGNOSIS — Z87448 Personal history of other diseases of urinary system: Secondary | ICD-10-CM | POA: Insufficient documentation

## 2013-10-26 DIAGNOSIS — N390 Urinary tract infection, site not specified: Secondary | ICD-10-CM | POA: Insufficient documentation

## 2013-10-26 DIAGNOSIS — Z79899 Other long term (current) drug therapy: Secondary | ICD-10-CM | POA: Insufficient documentation

## 2013-10-26 DIAGNOSIS — R110 Nausea: Secondary | ICD-10-CM

## 2013-10-26 DIAGNOSIS — A499 Bacterial infection, unspecified: Secondary | ICD-10-CM | POA: Insufficient documentation

## 2013-10-26 DIAGNOSIS — Z87442 Personal history of urinary calculi: Secondary | ICD-10-CM | POA: Insufficient documentation

## 2013-10-26 DIAGNOSIS — Z8742 Personal history of other diseases of the female genital tract: Secondary | ICD-10-CM | POA: Insufficient documentation

## 2013-10-26 DIAGNOSIS — B9689 Other specified bacterial agents as the cause of diseases classified elsewhere: Secondary | ICD-10-CM | POA: Insufficient documentation

## 2013-10-26 DIAGNOSIS — M549 Dorsalgia, unspecified: Secondary | ICD-10-CM | POA: Insufficient documentation

## 2013-10-26 DIAGNOSIS — N949 Unspecified condition associated with female genital organs and menstrual cycle: Secondary | ICD-10-CM | POA: Insufficient documentation

## 2013-10-26 DIAGNOSIS — N760 Acute vaginitis: Secondary | ICD-10-CM | POA: Insufficient documentation

## 2013-10-26 DIAGNOSIS — I10 Essential (primary) hypertension: Secondary | ICD-10-CM | POA: Insufficient documentation

## 2013-10-26 DIAGNOSIS — Z3202 Encounter for pregnancy test, result negative: Secondary | ICD-10-CM | POA: Insufficient documentation

## 2013-10-26 LAB — URINALYSIS, ROUTINE W REFLEX MICROSCOPIC
BILIRUBIN URINE: NEGATIVE
GLUCOSE, UA: NEGATIVE mg/dL
HGB URINE DIPSTICK: NEGATIVE
Ketones, ur: NEGATIVE mg/dL
Nitrite: NEGATIVE
Protein, ur: NEGATIVE mg/dL
SPECIFIC GRAVITY, URINE: 1.025 (ref 1.005–1.030)
Urobilinogen, UA: 0.2 mg/dL (ref 0.0–1.0)
pH: 6 (ref 5.0–8.0)

## 2013-10-26 LAB — CBC WITH DIFFERENTIAL/PLATELET
Basophils Absolute: 0 10*3/uL (ref 0.0–0.1)
Basophils Relative: 0 % (ref 0–1)
EOS ABS: 0.1 10*3/uL (ref 0.0–0.7)
EOS PCT: 2 % (ref 0–5)
HEMATOCRIT: 37.8 % (ref 36.0–46.0)
Hemoglobin: 12.4 g/dL (ref 12.0–15.0)
LYMPHS ABS: 2.4 10*3/uL (ref 0.7–4.0)
LYMPHS PCT: 35 % (ref 12–46)
MCH: 30.5 pg (ref 26.0–34.0)
MCHC: 32.8 g/dL (ref 30.0–36.0)
MCV: 93.1 fL (ref 78.0–100.0)
MONO ABS: 0.4 10*3/uL (ref 0.1–1.0)
MONOS PCT: 6 % (ref 3–12)
Neutro Abs: 3.9 10*3/uL (ref 1.7–7.7)
Neutrophils Relative %: 57 % (ref 43–77)
Platelets: 250 10*3/uL (ref 150–400)
RBC: 4.06 MIL/uL (ref 3.87–5.11)
RDW: 12.8 % (ref 11.5–15.5)
WBC: 6.8 10*3/uL (ref 4.0–10.5)

## 2013-10-26 LAB — COMPREHENSIVE METABOLIC PANEL
ALT: 10 U/L (ref 0–35)
AST: 12 U/L (ref 0–37)
Albumin: 4.1 g/dL (ref 3.5–5.2)
Alkaline Phosphatase: 72 U/L (ref 39–117)
BILIRUBIN TOTAL: 0.4 mg/dL (ref 0.3–1.2)
BUN: 11 mg/dL (ref 6–23)
CHLORIDE: 102 meq/L (ref 96–112)
CO2: 22 meq/L (ref 19–32)
CREATININE: 0.9 mg/dL (ref 0.50–1.10)
Calcium: 8.6 mg/dL (ref 8.4–10.5)
GFR, EST NON AFRICAN AMERICAN: 81 mL/min — AB (ref >90)
GLUCOSE: 100 mg/dL — AB (ref 70–99)
Potassium: 4 mEq/L (ref 3.7–5.3)
Sodium: 138 mEq/L (ref 137–147)
Total Protein: 7.9 g/dL (ref 6.0–8.3)

## 2013-10-26 LAB — WET PREP, GENITAL
Trich, Wet Prep: NONE SEEN
YEAST WET PREP: NONE SEEN

## 2013-10-26 LAB — PREGNANCY, URINE: Preg Test, Ur: NEGATIVE

## 2013-10-26 LAB — URINE MICROSCOPIC-ADD ON

## 2013-10-26 MED ORDER — SODIUM CHLORIDE 0.9 % IV SOLN
Freq: Once | INTRAVENOUS | Status: AC
  Administered 2013-10-26: 13:00:00 via INTRAVENOUS

## 2013-10-26 MED ORDER — HYDROMORPHONE HCL PF 1 MG/ML IJ SOLN
0.5000 mg | Freq: Once | INTRAMUSCULAR | Status: AC
  Administered 2013-10-26: 0.5 mg via INTRAVENOUS
  Filled 2013-10-26: qty 1

## 2013-10-26 MED ORDER — DEXTROSE 5 % IV SOLN
1.0000 g | Freq: Once | INTRAVENOUS | Status: AC
  Administered 2013-10-26: 1 g via INTRAVENOUS

## 2013-10-26 MED ORDER — PROMETHAZINE HCL 25 MG/ML IJ SOLN
12.5000 mg | Freq: Once | INTRAMUSCULAR | Status: AC
  Administered 2013-10-26: 12.5 mg via INTRAVENOUS
  Filled 2013-10-26: qty 1

## 2013-10-26 MED ORDER — CLINDAMYCIN HCL 150 MG PO CAPS: 150.0000 mg | ORAL_CAPSULE | Freq: Four times a day (QID) | ORAL | Status: DC

## 2013-10-26 MED ORDER — HYDROCODONE-ACETAMINOPHEN 5-325 MG PO TABS
1.0000 | ORAL_TABLET | Freq: Four times a day (QID) | ORAL | Status: DC | PRN
Start: 2013-10-26 — End: 2014-02-16

## 2013-10-26 MED ORDER — PROMETHAZINE HCL 25 MG PO TABS: 25.0000 mg | ORAL_TABLET | Freq: Four times a day (QID) | ORAL | Status: DC | PRN

## 2013-10-26 MED ORDER — SULFAMETHOXAZOLE-TRIMETHOPRIM 800-160 MG PO TABS: 1.0000 | ORAL_TABLET | Freq: Two times a day (BID) | ORAL | Status: DC

## 2013-10-26 MED ORDER — FAMOTIDINE IN NACL 20-0.9 MG/50ML-% IV SOLN
20.0000 mg | Freq: Once | INTRAVENOUS | Status: AC
  Administered 2013-10-26: 20 mg via INTRAVENOUS
  Filled 2013-10-26: qty 50

## 2013-10-26 MED ORDER — CEFTRIAXONE SODIUM 1 G IJ SOLR
INTRAMUSCULAR | Status: AC
  Filled 2013-10-26: qty 10

## 2013-10-26 NOTE — ED Provider Notes (Signed)
CSN: [id number]     Arrival date & time 10/26/13  1123 History   First MD Initiated Contact with Patient 10/26/13 1201     Chief Complaint  Patient presents with  . Abdominal Pain  . Flank Pain   (Consider location/radiation/quality/duration/timing/severity/associated sxs/prior Treatment) Patient is a 38 y.o. female presenting with abdominal pain and flank pain. The history is provided by the patient.  Abdominal Pain Pain location:  R flank Pain quality: aching   Pain radiates to:  Suprapubic region Pain severity:  Moderate Onset quality:  Gradual Duration:  5 days Timing:  Constant Progression:  Worsening Chronicity:  New Relieved by:  None tried Worsened by:  Nothing tried Ineffective treatments:  None tried Associated symptoms: chills, fever and nausea   Associated symptoms: no chest pain, no cough, no dysuria, no shortness of breath, no vaginal bleeding and no vomiting   Flank Pain Associated symptoms include abdominal pain, chills, a fever and nausea. Pertinent negatives include no chest pain, coughing, headaches, myalgias, rash or vomiting.   [name] is a 38 y.o. female who presents to the ED with right flank pain that started 5 days ago. Yesterday she began feeling nauseated and having chills and ? Fever. She has felt this way before when she had a UTI. Her urine is darker than usual and has a smell. She also has a history of kidney stones but this does not feel like a stone. Last pap smear 2 years ago and was normal. No history of STI's. Sexually active with husband only.   Past Medical History  Diagnosis Date  . Hypertension   . Chronic UTI   . Complication of anesthesia   . Asthma   . Kidney stones   . Pyelonephritis, chronic   . Ovarian cyst   . Bacterial vaginosis   . Candidal vaginitis    Past Surgical History  Procedure Laterality Date  . Lithotripsy  2000's  . Cystoscopy  2001-2012    "total of 11; for chronic chronic pyelonephritis"  . Tubal  ligation  2002  . Nephrostomy     No family history on file. History  Substance Use Topics  . Smoking status: Current Every Day Smoker -- 0.25 packs/day for 18 years    Types: Cigarettes  . Smokeless tobacco: Never Used  . Alcohol Use: No   OB History   Grav Para Term Preterm Abortions TAB SAB Ect Mult Living                 Review of Systems  Constitutional: Positive for fever and chills.  HENT: Negative.   Eyes: Negative for visual disturbance.  Respiratory: Negative for cough and shortness of breath.   Cardiovascular: Negative for chest pain.  Gastrointestinal: Positive for nausea and abdominal pain. Negative for vomiting.  Genitourinary: Positive for urgency, flank pain and pelvic pain. Negative for dysuria, frequency and vaginal bleeding.  Musculoskeletal: Positive for back pain. Negative for myalgias.  Skin: Negative for rash.  Neurological: Negative for light-headedness and headaches.  Psychiatric/Behavioral: Negative for confusion. The patient is not nervous/anxious.     Allergies  Flagyl; Ketorolac tromethamine; Metoclopramide hcl; Percocet; and Zofran  Home Medications   Current Outpatient Rx  Name  Route  Sig  Dispense  Refill  . albuterol (PROVENTIL HFA;VENTOLIN HFA) 108 (90 BASE) MCG/ACT inhaler   Inhalation   Inhale 2 puffs into the lungs every 6 (six) hours as needed for wheezing. For wheezing         .  BIOTIN PO   Oral   Take 1 tablet by mouth daily.         . ciprofloxacin (CIPRO) 750 MG tablet   Oral   Take 1 tablet (750 mg total) by mouth 2 (two) times daily.   14 tablet   0   . fluconazole (DIFLUCAN) 150 MG tablet   Oral   Take 1 tablet (150 mg total) by mouth once.   1 tablet   0   . hydrochlorothiazide (HYDRODIURIL) 12.5 MG tablet   Oral   Take 12.5 mg by mouth daily.         Marland Kitchen HYDROcodone-acetaminophen (NORCO/VICODIN) 5-325 MG per tablet   Oral   Take 1 tablet by mouth every 6 (six) hours as needed.   20 tablet   0   .  metoprolol succinate (TOPROL-XL) 25 MG 24 hr tablet   Oral   Take 25 mg by mouth daily.         . metoprolol tartrate (LOPRESSOR) 25 MG tablet   Oral   Take 12.5 mg by mouth 2 (two) times daily.         . ondansetron (ZOFRAN) 4 MG tablet   Oral   Take 1 tablet (4 mg total) by mouth every 6 (six) hours as needed for nausea.   20 tablet   0   . zolpidem (AMBIEN) 5 MG tablet   Oral   Take 5 mg by mouth at bedtime as needed for sleep.          BP 129/88  Pulse 84  Temp(Src) 98.8 F (37.1 C) (Oral)  Resp 16  Ht 5\' 5"  (1.651 m)  Wt 226 lb (102.513 kg)  BMI 37.61 kg/m2  SpO2 100%  LMP 10/12/2013 Physical Exam  Nursing note and vitals reviewed. Constitutional: She is oriented to person, place, and time. She appears well-developed and well-nourished. No distress.  HENT:  Head: Normocephalic and atraumatic.  Eyes: Conjunctivae and EOM are normal.  Neck: Normal range of motion. Neck supple.  Cardiovascular: Normal rate and regular rhythm.   Pulmonary/Chest: Effort normal and breath sounds normal.  Abdominal: Soft. Bowel sounds are normal. There is no tenderness. There is CVA tenderness (right).  Unable to reproduce the pain the patient has had.  Genitourinary:  External genitalia without lesions, frothy malodorous discharge vaginal vault. Mild  CMT, nabothian cyst. No adnexal tenderness, uterus without palpable enlargement.   Musculoskeletal: Normal range of motion.  Neurological: She is alert and oriented to person, place, and time. No cranial nerve deficit.  Skin: Skin is warm and dry.  Psychiatric: She has a normal mood and affect. Her behavior is normal.    ED Course  Procedures At 1355 patient re examined and abdomen soft, non tender. Patient feeling better, no nausea. She has had 700 cc of IV fluids and Phenergan for her nausea. Taking PO fluids without difficulty. Rocephin 1 gram IV infusing. Then Pepcid 20 mg IV.  Imaging Review No results found. Results for  orders placed during the hospital encounter of 10/26/13 (from the past 24 hour(s))  PREGNANCY, URINE     Status: None   Collection Time    10/26/13 11:32 AM      Result Value Range   Preg Test, Ur NEGATIVE  NEGATIVE  URINALYSIS, ROUTINE W REFLEX MICROSCOPIC     Status: Abnormal   Collection Time    10/26/13 11:32 AM      Result Value Range   Color, Urine YELLOW  YELLOW  APPearance CLOUDY (*) CLEAR   Specific Gravity, Urine 1.025  1.005 - 1.030   pH 6.0  5.0 - 8.0   Glucose, UA NEGATIVE  NEGATIVE mg/dL   Hgb urine dipstick NEGATIVE  NEGATIVE   Bilirubin Urine NEGATIVE  NEGATIVE   Ketones, ur NEGATIVE  NEGATIVE mg/dL   Protein, ur NEGATIVE  NEGATIVE mg/dL   Urobilinogen, UA 0.2  0.0 - 1.0 mg/dL   Nitrite NEGATIVE  NEGATIVE   Leukocytes, UA SMALL (*) NEGATIVE  URINE MICROSCOPIC-ADD ON     Status: Abnormal   Collection Time    10/26/13 11:32 AM      Result Value Range   Squamous Epithelial / LPF MANY (*) RARE   WBC, UA 11-20  <3 WBC/hpf   Bacteria, UA MANY (*) RARE   Urine-Other FEW YEAST    CBC WITH DIFFERENTIAL     Status: None   Collection Time    10/26/13 11:45 AM      Result Value Range   WBC 6.8  4.0 - 10.5 K/uL   RBC 4.06  3.87 - 5.11 MIL/uL   Hemoglobin 12.4  12.0 - 15.0 g/dL   HCT 91.437.8  78.236.0 - 95.646.0 %   MCV 93.1  78.0 - 100.0 fL   MCH 30.5  26.0 - 34.0 pg   MCHC 32.8  30.0 - 36.0 g/dL   RDW 21.312.8  08.611.5 - 57.815.5 %   Platelets 250  150 - 400 K/uL   Neutrophils Relative % 57  43 - 77 %   Neutro Abs 3.9  1.7 - 7.7 K/uL   Lymphocytes Relative 35  12 - 46 %   Lymphs Abs 2.4  0.7 - 4.0 K/uL   Monocytes Relative 6  3 - 12 %   Monocytes Absolute 0.4  0.1 - 1.0 K/uL   Eosinophils Relative 2  0 - 5 %   Eosinophils Absolute 0.1  0.0 - 0.7 K/uL   Basophils Relative 0  0 - 1 %   Basophils Absolute 0.0  0.0 - 0.1 K/uL   WBC Morphology VACUOLATED NEUTROPHILS    COMPREHENSIVE METABOLIC PANEL     Status: Abnormal   Collection Time    10/26/13 11:45 AM      Result Value Range    Sodium 138  137 - 147 mEq/L   Potassium 4.0  3.7 - 5.3 mEq/L   Chloride 102  96 - 112 mEq/L   CO2 22  19 - 32 mEq/L   Glucose, Bld 100 (*) 70 - 99 mg/dL   BUN 11  6 - 23 mg/dL   Creatinine, Ser 4.690.90  0.50 - 1.10 mg/dL   Calcium 8.6  8.4 - 62.910.5 mg/dL   Total Protein 7.9  6.0 - 8.3 g/dL   Albumin 4.1  3.5 - 5.2 g/dL   AST 12  0 - 37 U/L   ALT 10  0 - 35 U/L   Alkaline Phosphatase 72  39 - 117 U/L   Total Bilirubin 0.4  0.3 - 1.2 mg/dL   GFR calc non Af Amer 81 (*) >90 mL/min   GFR calc Af Amer >90  >90 mL/min  WET PREP, GENITAL     Status: Abnormal   Collection Time    10/26/13 12:32 PM      Result Value Range   Yeast Wet Prep HPF POC NONE SEEN  NONE SEEN   Trich, Wet Prep NONE SEEN  NONE SEEN   Clue Cells Wet Prep HPF POC  MANY (*) NONE SEEN   WBC, Wet Prep HPF POC FEW (*) NONE SEEN    EKG Interpretation   None     US Transvaginal Non-ob  10/26/2013   CLINICAL DATA:  Right flank discomfort, history of bilateral tubal ligation.  EXAM: TRANSABDOMINAL AND TRANSVAGINAL ULTRASOUND OF PELVIS  TECHNIQUE: Both transabdominal and transvaginal ultrasound examinations of the pelvis were performed. Transabdominal technique was performed for global imaging of the pelvis including uterus, ovaries, adnexal regions, and pelvic cul-de-sac. It was necessary to proceed with endovaginal exam following the transabdominal exam to visualize the endometrium and ovaries and adnexal structures.  COMPARISON:  Ultrasound of the pelvis September 26, 2013.  FINDINGS: Uterus  Measurements: 10.6 x 4.5 x 4.9 cm. Nabothian cysts are demonstrated within the cervix. No fibroids or other mass visualized.  Endometrium  Thickness: 1.3 cm.  No focal abnormality visualized.  Right ovary  Measurements: 2.7 x 1.9 x 2 cm. There is a single dominant follicle demonstrated which measures 1.6 x 1.2 x 1.4 cm.  Left ovary  Measurements: 2.3 x 1.4 x 1.8 cm. Normal appearance/no adnexal mass.  Other findings  There is a trace of free fluid  in the cul de sac.  IMPRESSION: 1. The endometrial stripe is mildly thickened. Correlation with the patient's menstrual cycle is needed. There are nabothian cysts demonstrated. 2. There is a 1.6 cm maximal dimension cyst versus dominant follicle in the right ovary. This likely has not greatly changed. The left ovary is normal in echotexture. 3. There is small amount of free fluid in the cul de sac.   Electronically Signed   By: David  [place]   On: 10/26/2013 14:53   US Pelvis Complete  10/26/2013   CLINICAL DATA:  Right flank discomfort, history of bilateral tubal ligation.  EXAM: TRANSABDOMINAL AND TRANSVAGINAL ULTRASOUND OF PELVIS  TECHNIQUE: Both transabdominal and transvaginal ultrasound examinations of the pelvis were performed. Transabdominal technique was performed for global imaging of the pelvis including uterus, ovaries, adnexal regions, and pelvic cul-de-sac. It was necessary to proceed with endovaginal exam following the transabdominal exam to visualize the endometrium and ovaries and adnexal structures.  COMPARISON:  Ultrasound of the pelvis September 26, 2013.  FINDINGS: Uterus  Measurements: 10.6 x 4.5 x 4.9 cm. Nabothian cysts are demonstrated within the cervix. No fibroids or other mass visualized.  Endometrium  Thickness: 1.3 cm.  No focal abnormality visualized.  Right ovary  Measurements: 2.7 x 1.9 x 2 cm. There is a single dominant follicle demonstrated which measures 1.6 x 1.2 x 1.4 cm.  Left ovary  Measurements: 2.3 x 1.4 x 1.8 cm. Normal appearance/no adnexal mass.  Other findings  There is a trace of free fluid in the cul de sac.  IMPRESSION: 1. The endometrial stripe is mildly thickened. Correlation with the patient's menstrual cycle is needed. There are nabothian cysts demonstrated. 2. There is a 1.6 cm maximal dimension cyst versus dominant follicle in the right ovary. This likely has not greatly changed. The left ovary is normal in echotexture. 3. There is small amount of free fluid in  the cul de sac.   Electronically Signed   By: David  [place]   On: 10/26/2013 14:53    Patient treated in ED with IV fluids, pain medication, Rocephin 1 gram IV, Phenergan.  Patient feeling better after treatment. Will d/c home. She is able to take PO fluids without nausea.  MDM  38 y.o. female with UTI and bacterial vaginosis. Will treat with antibiotics and  pain medication. She will follow up with her PCP or return for worsening symptoms.  I have reviewed this patient's vital signs, nurses notes, appropriate labs and imaging.  I have discussed finding with the patient and plan of care. She voices understanding.    Medication List    TAKE these medications       clindamycin 150 MG capsule  Commonly known as:  CLEOCIN  Take 1 capsule (150 mg total) by mouth every 6 (six) hours.     promethazine 25 MG tablet  Commonly known as:  PHENERGAN  Take 1 tablet (25 mg total) by mouth every 6 (six) hours as needed for nausea or vomiting.     sulfamethoxazole-trimethoprim 800-160 MG per tablet  Commonly known as:  SEPTRA DS  Take 1 tablet by mouth every 12 (twelve) hours.      ASK your doctor about these medications       albuterol 108 (90 BASE) MCG/ACT inhaler  Commonly known as:  PROVENTIL HFA;VENTOLIN HFA  Inhale 2 puffs into the lungs every 6 (six) hours as needed for wheezing. For wheezing     BIOTIN PO  Take 1 tablet by mouth daily.     ciprofloxacin 750 MG tablet  Commonly known as:  CIPRO  Take 1 tablet (750 mg total) by mouth 2 (two) times daily.     fluconazole 150 MG tablet  Commonly known as:  DIFLUCAN  Take 1 tablet (150 mg total) by mouth once.     hydrochlorothiazide 12.5 MG tablet  Commonly known as:  HYDRODIURIL  Take 12.5 mg by mouth daily.     HYDROcodone-acetaminophen 5-325 MG per tablet  Commonly known as:  NORCO/VICODIN  Take 1 tablet by mouth every 6 (six) hours as needed.  Ask about: Which instructions should I use?     HYDROcodone-acetaminophen 5-325 MG  per tablet  Commonly known as:  NORCO/VICODIN  Take 1 tablet by mouth every 6 (six) hours as needed for moderate pain.  Ask about: Which instructions should I use?     metoprolol succinate 25 MG 24 hr tablet  Commonly known as:  TOPROL-XL  Take 25 mg by mouth daily.     metoprolol tartrate 25 MG tablet  Commonly known as:  LOPRESSOR  Take 12.5 mg by mouth 2 (two) times daily.     ondansetron 4 MG tablet  Commonly known as:  ZOFRAN  Take 1 tablet (4 mg total) by mouth every 6 (six) hours as needed for nausea.     zolpidem 5 MG tablet  Commonly known as:  AMBIEN  Take 5 mg by mouth at bedtime as needed for sleep.           Janne Napoleon, [place] 10/26/13 1701

## 2013-10-27 LAB — URINE CULTURE

## 2013-10-27 LAB — GC/CHLAMYDIA PROBE AMP
CT PROBE, AMP APTIMA: NEGATIVE
GC Probe RNA: NEGATIVE

## 2013-10-27 NOTE — ED Provider Notes (Signed)
Medical screening examination/treatment/procedure(s) were performed by non-physician practitioner and as supervising physician I was immediately available for consultation/collaboration.  EKG Interpretation   None         Charles B. Bernette MayersSheldon, MD 10/27/13 1145

## 2013-11-04 ENCOUNTER — Encounter (HOSPITAL_BASED_OUTPATIENT_CLINIC_OR_DEPARTMENT_OTHER): Payer: Self-pay | Admitting: Emergency Medicine

## 2013-11-04 ENCOUNTER — Emergency Department (HOSPITAL_BASED_OUTPATIENT_CLINIC_OR_DEPARTMENT_OTHER): Payer: Medicaid Other

## 2013-11-04 ENCOUNTER — Emergency Department (HOSPITAL_BASED_OUTPATIENT_CLINIC_OR_DEPARTMENT_OTHER)
Admission: EM | Admit: 2013-11-04 | Discharge: 2013-11-04 | Disposition: A | Discharge status: Home / Self Care | Payer: Medicaid Other | Attending: Emergency Medicine | Admitting: Emergency Medicine

## 2013-11-04 DIAGNOSIS — J45909 Unspecified asthma, uncomplicated: Secondary | ICD-10-CM | POA: Insufficient documentation

## 2013-11-04 DIAGNOSIS — Z79899 Other long term (current) drug therapy: Secondary | ICD-10-CM | POA: Insufficient documentation

## 2013-11-04 DIAGNOSIS — I10 Essential (primary) hypertension: Secondary | ICD-10-CM

## 2013-11-04 DIAGNOSIS — N390 Urinary tract infection, site not specified: Secondary | ICD-10-CM | POA: Insufficient documentation

## 2013-11-04 DIAGNOSIS — Z8619 Personal history of other infectious and parasitic diseases: Secondary | ICD-10-CM | POA: Insufficient documentation

## 2013-11-04 DIAGNOSIS — Z8742 Personal history of other diseases of the female genital tract: Secondary | ICD-10-CM | POA: Insufficient documentation

## 2013-11-04 DIAGNOSIS — F17200 Nicotine dependence, unspecified, uncomplicated: Secondary | ICD-10-CM | POA: Insufficient documentation

## 2013-11-04 DIAGNOSIS — Z87442 Personal history of urinary calculi: Secondary | ICD-10-CM | POA: Insufficient documentation

## 2013-11-04 DIAGNOSIS — Z792 Long term (current) use of antibiotics: Secondary | ICD-10-CM | POA: Insufficient documentation

## 2013-11-04 LAB — PREGNANCY, URINE: PREG TEST UR: NEGATIVE

## 2013-11-04 MED ORDER — PROMETHAZINE HCL 25 MG/ML IJ SOLN
12.5000 mg | Freq: Once | INTRAMUSCULAR | Status: AC
Start: 2013-11-04 — End: 2013-11-04
  Administered 2013-11-04: 12.5 mg via INTRAMUSCULAR
  Filled 2013-11-04: qty 1

## 2013-11-04 MED ORDER — HYDROCHLOROTHIAZIDE 25 MG PO TABS
25.0000 mg | ORAL_TABLET | Freq: Once | ORAL | Status: AC
  Administered 2013-11-04: 25 mg via ORAL
  Filled 2013-11-04: qty 1

## 2013-11-04 MED ORDER — METOPROLOL SUCCINATE ER 25 MG PO TB24: 25.0000 mg | ORAL_TABLET | Freq: Every day | ORAL | Status: DC

## 2013-11-04 MED ORDER — IBUPROFEN 800 MG PO TABS
800.0000 mg | ORAL_TABLET | Freq: Once | ORAL | Status: AC
  Administered 2013-11-04: 800 mg via ORAL
  Filled 2013-11-04: qty 1

## 2013-11-04 NOTE — ED Provider Notes (Signed)
CSN: [id number]     Arrival date & time 11/04/13  0215 History   First MD Initiated Contact with Patient 11/04/13 0258     Chief Complaint  Patient presents with  . Headache   (Consider location/radiation/quality/duration/timing/severity/associated sxs/prior Treatment) Patient is a 38 y.o. female presenting with headaches. The history is provided by the patient.  Headache Pain location:  Frontal Quality:  Dull Radiates to:  Does not radiate Severity at highest:  10/10 Onset quality:  Sudden Duration:  2 hours Timing:  Constant Progression:  Unchanged Chronicity:  New Context: not activity and not intercourse   Relieved by:  Nothing Associated symptoms: no abdominal pain, no cough, no dizziness, no pain, no fever, no loss of balance, no neck pain, no numbness, no photophobia, no seizures and no syncope   Risk factors: no family hx of SAH     Past Medical History  Diagnosis Date  . Hypertension   . Chronic UTI   . Complication of anesthesia   . Asthma   . Kidney stones   . Pyelonephritis, chronic   . Ovarian cyst   . Bacterial vaginosis   . Candidal vaginitis    Past Surgical History  Procedure Laterality Date  . Lithotripsy  2000's  . Cystoscopy  2001-2012    "total of 11; for chronic chronic pyelonephritis"  . Tubal ligation  2002  . Nephrostomy     History reviewed. No pertinent family history. History  Substance Use Topics  . Smoking status: Current Every Day Smoker -- 0.25 packs/day for 18 years    Types: Cigarettes  . Smokeless tobacco: Never Used  . Alcohol Use: No   OB History   Grav Para Term Preterm Abortions TAB SAB Ect Mult Living                 Review of Systems  Constitutional: Negative for fever.  Eyes: Negative for photophobia and pain.  Respiratory: Negative for cough.   Cardiovascular: Negative for syncope.  Gastrointestinal: Negative for abdominal pain.  Musculoskeletal: Negative for neck pain.  Neurological: Positive for headaches.  Negative for dizziness, seizures, facial asymmetry, speech difficulty, weakness, light-headedness, numbness and loss of balance.  All other systems reviewed and are negative.    Allergies  Flagyl; Ketorolac tromethamine; Metoclopramide hcl; Percocet; and Zofran  Home Medications   Current Outpatient Rx  Name  Route  Sig  Dispense  Refill  . metoprolol tartrate (LOPRESSOR) 25 MG tablet   Oral   Take 12.5 mg by mouth 2 (two) times daily.         Marland Kitchen. albuterol (PROVENTIL HFA;VENTOLIN HFA) 108 (90 BASE) MCG/ACT inhaler   Inhalation   Inhale 2 puffs into the lungs every 6 (six) hours as needed for wheezing. For wheezing         . BIOTIN PO   Oral   Take 1 tablet by mouth daily.         . ciprofloxacin (CIPRO) 750 MG tablet   Oral   Take 1 tablet (750 mg total) by mouth 2 (two) times daily.   14 tablet   0   . clindamycin (CLEOCIN) 150 MG capsule   Oral   Take 1 capsule (150 mg total) by mouth every 6 (six) hours.   28 capsule   0   . fluconazole (DIFLUCAN) 150 MG tablet   Oral   Take 1 tablet (150 mg total) by mouth once.   1 tablet   0   . hydrochlorothiazide (HYDRODIURIL)  12.5 MG tablet   Oral   Take 12.5 mg by mouth daily.         Marland Kitchen HYDROcodone-acetaminophen (NORCO/VICODIN) 5-325 MG per tablet   Oral   Take 1 tablet by mouth every 6 (six) hours as needed.   20 tablet   0   . HYDROcodone-acetaminophen (NORCO/VICODIN) 5-325 MG per tablet   Oral   Take 1 tablet by mouth every 6 (six) hours as needed for moderate pain.   30 tablet   0   . metoprolol succinate (TOPROL-XL) 25 MG 24 hr tablet   Oral   Take 25 mg by mouth daily.         . metoprolol succinate (TOPROL-XL) 25 MG 24 hr tablet   Oral   Take 1 tablet (25 mg total) by mouth daily.   15 tablet   0   . ondansetron (ZOFRAN) 4 MG tablet   Oral   Take 1 tablet (4 mg total) by mouth every 6 (six) hours as needed for nausea.   20 tablet   0   . promethazine (PHENERGAN) 25 MG tablet   Oral    Take 1 tablet (25 mg total) by mouth every 6 (six) hours as needed for nausea or vomiting.   20 tablet   0   . sulfamethoxazole-trimethoprim (SEPTRA DS) 800-160 MG per tablet   Oral   Take 1 tablet by mouth every 12 (twelve) hours.   14 tablet   0   . zolpidem (AMBIEN) 5 MG tablet   Oral   Take 5 mg by mouth at bedtime as needed for sleep.          BP 162/106  Pulse 74  Resp 20  Ht 5\' 5"  (1.651 m)  Wt 220 lb (99.791 kg)  BMI 36.61 kg/m2  SpO2 100%  LMP 10/06/2013 Physical Exam  Constitutional: She is oriented to person, place, and time. She appears well-developed and well-nourished. No distress.  HENT:  Head: Normocephalic and atraumatic.  Right Ear: External ear normal.  Left Ear: External ear normal.  Mouth/Throat: Oropharynx is clear and moist. No oropharyngeal exudate.  Eyes: Conjunctivae and EOM are normal. Pupils are equal, round, and reactive to light.  Neck: Normal range of motion. Neck supple.  Cardiovascular: Normal rate, regular rhythm and intact distal pulses.   Pulmonary/Chest: Effort normal and breath sounds normal. She has no wheezes. She has no rales.  Abdominal: Soft. Bowel sounds are normal. There is no tenderness. There is no rebound and no guarding.  Musculoskeletal: Normal range of motion.  Neurological: She is alert and oriented to person, place, and time. She displays normal reflexes. No cranial nerve deficit. She exhibits normal muscle tone. Coordination normal.  Steady gait without ataxia  Skin: Skin is warm and dry.  Psychiatric: She has a normal mood and affect.    ED Course  Procedures (including critical care time) Labs Review Labs Reviewed  PREGNANCY, URINE   Imaging Review Ct Head Wo Contrast  11/04/2013   CLINICAL DATA:  Left-sided headache.  Hypertension.  EXAM: CT HEAD WITHOUT CONTRAST  TECHNIQUE: Contiguous axial images were obtained from the base of the skull through the vertex without intravenous contrast.  COMPARISON:  None.   FINDINGS: There is no evidence of acute infarction, mass lesion, or intra- or extra-axial hemorrhage on CT.  The posterior fossa, including the cerebellum, brainstem and fourth ventricle, is within normal limits. The third and lateral ventricles, and basal ganglia are unremarkable in appearance. The cerebral  hemispheres are symmetric in appearance, with normal gray-white differentiation. No mass effect or midline shift is seen.  There is no evidence of fracture; visualized osseous structures are unremarkable in appearance. The orbits are within normal limits. The paranasal sinuses and mastoid air cells are well-aerated. No significant soft tissue abnormalities are seen.  IMPRESSION: Unremarkable noncontrast CT of the head.   Electronically Signed   By: Roanna Raider M.D.   On: 11/04/2013 03:19    EKG Interpretation   None       MDM   1. Hypertension    Within 3 hours negative CAT head excluded bleed.  Patient appears comfortable.  Will give 2 week supply of BP med to get patient through to doctors visit.     Jasmine Awe, MD 11/04/13 [phone]

## 2013-11-04 NOTE — Discharge Instructions (Signed)
°Emergency Department Resource Guide °1) Find a Doctor and Pay Out of Pocket °Although you won't have to find out who is covered by your insurance plan, it is a good idea to ask around and get recommendations. You will then need to call the office and see if the doctor you have chosen will accept you as a new patient and what types of options they offer for patients who are self-pay. Some doctors offer discounts or will set up payment plans for their patients who do not have insurance, but you will need to ask so you aren't surprised when you get to your appointment. ° °2) Contact Your Local Health Department °Not all health departments have doctors that can see patients for sick visits, but many do, so it is worth a call to see if yours does. If you don't know where your local health department is, you can check in your phone book. The CDC also has a tool to help you locate your state's health department, and many state websites also have listings of all of their local health departments. ° °3) Find a Walk-in Clinic °If your illness is not likely to be very severe or complicated, you may want to try a walk in clinic. These are popping up all over the country in pharmacies, drugstores, and shopping centers. They're usually staffed by nurse practitioners or physician assistants that have been trained to treat common illnesses and complaints. They're usually fairly quick and inexpensive. However, if you have serious medical issues or chronic medical problems, these are probably not your best option. ° °No Primary Care Doctor: °- Call [facility] at  832-8000 - they can help you locate a primary care doctor that  accepts your insurance, provides certain services, etc. °- Physician Referral Service- 1-800-533-3463 ° °Chronic Pain Problems: °Organization         Address  Phone   Notes  °[facility]  (336) 297-2271 Patients need to be referred by their primary care doctor.  ° °Medication  Assistance: °Organization         Address  Phone   Notes  °[place] [facility] [address] °[place], [place] [address] (336) 641-8030 --Must be a resident of [place] °-- Must have NO insurance coverage whatsoever (no Medicaid/ Medicare, etc.) °-- The pt. MUST have a primary care doctor that directs their care regularly and follows them in the community °  °[facility]  (866) 331-1348   °[employer]  (888) 892-1162   ° °Agencies that provide inexpensive medical care: °Organization         Address  Phone   Notes  °[facility]  (336) 832-8035   °[facility]    (336) 832-7272   °[facility] [address] °[place],  [address] (336) 832-4777   °[facility] [address], °[place] (336) 271-4999   °[facility]    (336) 373-0678   °[facility]    (336) 272-1050   °[facility] ° [address], [place] Phone:  (336) 832-4444, Fax:  (336) 832-4440 Hours of Operation:  9 am - 6 pm, M-F.  Also accepts Medicaid/Medicare and self-pay.  °[facility] ° [address], [place] Phone: (336) 832-3150, Fax: (336) 832-3151. Hours of Operation:  8:30 am - 5:30 pm, M-F.  Also accepts Medicaid and self-pay.  °[facility] [place] [address], [place] Phone: (336) 878-6027   °[facility] [address], [place], [place] (336)723-1848, Ext. 123 Mondays & Thursdays: 7-9 AM.  First 15 patients are seen on a first come, first serve basis. °  ° °Medicaid-accepting [place] Providers: ° °Organization         Address  Phone   Notes  °[facility] [address], [place] (336) 641-2100 Also accepts self-pay patients.  °[facility] [address], [place] ° (336) 856-9996   °[facility] [address], [place]  (336) 288-8857   °[facility] [address] [place] [address], [place] (336) 299-7000   °Veita Bland [address], [place]  ° (336) 373-1557 Only accepts [place] Access Medicaid patients after they have their name applied to their card.  ° °Self-Pay (no insurance) in [place]: ° °Organization         Address  Phone   Notes  °Sickle Cell Patients, [facility] [address], [place] (336) 832-1970   °[facility] [address], [place] (336) 832-4400   °[facility] [place] [facility] ° [address], [place] (336) 992-4800   °[facility]/Dr. Osei-Bonsu ° [address] [place] [address], [place] or [address], [place] (336) [phone] Phone number for both [place] and [place] locations is the same.  °[facility] [address], [place] (336) 299-0000   °[facility] [address] [place] [address], [place] or [address] (336) 852-7530 °(336) 878-2260   °[facility] [address], [place] (336) 350-1642, phone; (336) 294-5005, fax Sees patients 1st and 3rd Saturday of every month.  Must not qualify for public or private insurance (i.e. Medicaid, Medicare, [place] Health Choice, Veterans' Benefits) • Household income should be no more than 200% of the poverty level •The clinic cannot treat you if you are pregnant or think you are pregnant • Sexually transmitted diseases are not treated at the clinic.  ° ° °Dental Care: °Organization         Address  Phone  Notes  °[place] [facility] [address], [place] (336) 641-6152 Accepts children up to age 21 who are enrolled in Medicaid or [place] Health Choice; pregnant women with a Medicaid card; and children who have applied for Medicaid or [place] Health Choice, but were declined, whose parents can pay a reduced fee at time of service.  °[place]  [facility] [place]  [address], [place] (336) 641-7733 Accepts children up to age 21 who are enrolled in Medicaid or [place] Health Choice; pregnant women with a Medicaid card; and children who have applied for Medicaid or  Health Choice, but were declined, whose parents can pay a reduced fee at time of service.  °[facility] ° [address], [place] (336) 641-4533 Patients are seen by appointment only. Walk-ins are not accepted. [facility] will see patients 18 years of age and older. °Monday - Tuesday (8am-5pm) °Most Wednesdays (8:30-5pm) °$30 per visit, cash only  °[facility] ° [address], [place] (336) 641-4533 Patients are seen by appointment only. Walk-ins are not accepted. [facility] will see patients 18 years of age and older. °One   Wednesday Evening (Monthly: Volunteer Based).  $30 per visit, cash only  °[facility]  (919) 537-3737 for adults; Children under age 4, call Graduate Pediatric Dentistry at ([phone]) [phone]. Children aged 4-14, please call (919) [phone] to request a pediatric application. ° Dental services are provided in all areas of dental care including fillings, crowns and bridges, complete and partial dentures, implants, gum treatment, root canals, and extractions. Preventive care is also provided. Treatment is provided to both adults and children. °Patients are selected via a lottery and there is often a waiting list. °  °[facility] [address], °[place] ° (336) 763-8833 [email or web address] °  °[facility] [address], [place], [place] (336)723-1848, Ext. 123 Second and Fourth Thursday of each month, opens at 6:30 AM; Clinic ends at 9 AM.  Patients are seen on a first-come first-served basis, and a limited number are seen during each clinic.  ° °[facility] ° [address], [place], [place] (336) 723-7904    Eligibility Requirements °You must have lived in [place], [place], or [place] counties for at least the last three months. °  You cannot be eligible for state or federal sponsored healthcare insurance, including Veterans Administration, Medicaid, or Medicare. °  You generally cannot be eligible for healthcare insurance through your employer.  °  How to apply: °Eligibility screenings are held every Tuesday and Wednesday afternoon from 1:00 pm until 4:00 pm. You do not need an appointment for the interview!  °[facility] [address], [place], [place] 336-631-2330   °[facility] [facility]  336-342-8273   °[facility]  336-703-3100   °[facility]  336-570-6415   ° °Behavioral Health Resources in the Community: °Intensive Outpatient Programs °Organization         Address  Phone  Notes  °[facility] [address], [place], [place] 336-878-6098   °[facility] [address], [place], [place] 336-832-9800   °[facility] [address], [place], [place] ° 336-882-2125   °[facility] [address],  °[place], [place] 1-800-853-5163 or 336-641-4981   °Substance Abuse Resources °Organization         Address  Phone  Notes  °[facility]  336-882-2125   °[facility]  336-784-9470   °[facility]  336-285-9073   °[facility]  336-845-3988   °Residential & Outpatient Substance Abuse Program  1-800-659-3381   °Psychological Services °Organization         Address  Phone  Notes  °[facility]  336- 832-9600   °[facility]  336- 378-7881   °[facility] [address], [place] 1-800-853-5163 or 336-641-4981   ° °Mobile Crisis Teams °Organization         Address  Phone  Notes  °[facility], Mobile Crisis Care Unit  1-877-626-1772   °Assertive °[facility] ° [address]  [place], [place] 336-834-9664   °Sharon DeEsch [address] °[place] 336-554-5454   ° °Self-Help/Support Groups °Organization         Address  Phone             Notes  °[facility]  - variety of support groups  336- 373-1402 Call for more information  °Narcotics Anonymous (NA), [facility] [address], °[place]  2 meetings at this location  ° °  Residential Treatment Programs °Organization         Address  Phone  Notes  °[facility] [address],    °[place]  1-866-801-8205   °[facility] ° [address], [place], [place] 704-293-8524   °[facility] [address], [place] 336-845-3988 Admissions: 8am-3pm M-F  °[facility] [address]    °[place], [place] 336-841-1104   °[facility] [address], [place], [place] 336-379-7146   °[facility] [address]  °[place], [place] 336-285-9073   °[facility] Intensive Outpatient [address], [place], [place] 336-852-3033   °[facility] [address]  °[place], [place] 1-877-615-2722 or 336-784-9470   °[facility] [address] [place], [place] 336-227-7417 Accepts Medicaid  °[facility] [address]  °[place] 1-800-659-3381 Substance Abuse/Addiction Treatment  ° °[place] Behavioral Health Resources °Organization         Address  Phone  Notes  °[facility]  (888) 581-9988   °Julie Brannon, PhD [address] [place], [place]   (336) 349-5553 or (336) 951-0000   °[facility]   [address] °[place], [place] (336) 349-4454   °[facility] [address], [place], [place] (336) 342-8316 Insurance/Medicaid/sponsorship through [facility]  °[facility] [address]                                    [place], [place] (336) 342-8316 Therapy/tele-psych/case    °[facility] [address]  ° [place], [place] (336) 349-2233    °Dr. Arfeen  (336) 349-4544   °[facility] [place]  [employer] [place] [facility] 1) [address], [place] °2) [address], [place] °3)  [address], [place] (336) 349-3220 °(336) 342-7768 ° °(336) 342-8140   °[place] [facility] (336) 342-1394 or (336) 342-3537 (After Hours)    ° ° °

## 2013-11-04 NOTE — ED Notes (Signed)
Pt states she awoke this am with severe headache and that her bp has been elevated since yesterday

## 2014-02-01 ENCOUNTER — Emergency Department (HOSPITAL_BASED_OUTPATIENT_CLINIC_OR_DEPARTMENT_OTHER)
Admission: EM | Admit: 2014-02-01 | Discharge: 2014-02-01 | Disposition: A | Discharge status: Home / Self Care | Payer: BC Managed Care – PPO | Attending: Emergency Medicine | Admitting: Emergency Medicine

## 2014-02-01 ENCOUNTER — Emergency Department (HOSPITAL_BASED_OUTPATIENT_CLINIC_OR_DEPARTMENT_OTHER): Payer: BC Managed Care – PPO

## 2014-02-01 ENCOUNTER — Encounter (HOSPITAL_BASED_OUTPATIENT_CLINIC_OR_DEPARTMENT_OTHER): Payer: Self-pay | Admitting: Emergency Medicine

## 2014-02-01 DIAGNOSIS — Z87442 Personal history of urinary calculi: Secondary | ICD-10-CM | POA: Insufficient documentation

## 2014-02-01 DIAGNOSIS — Z87448 Personal history of other diseases of urinary system: Secondary | ICD-10-CM | POA: Insufficient documentation

## 2014-02-01 DIAGNOSIS — F17200 Nicotine dependence, unspecified, uncomplicated: Secondary | ICD-10-CM | POA: Insufficient documentation

## 2014-02-01 DIAGNOSIS — Z87440 Personal history of urinary (tract) infections: Secondary | ICD-10-CM | POA: Insufficient documentation

## 2014-02-01 DIAGNOSIS — Z792 Long term (current) use of antibiotics: Secondary | ICD-10-CM | POA: Insufficient documentation

## 2014-02-01 DIAGNOSIS — Z3202 Encounter for pregnancy test, result negative: Secondary | ICD-10-CM | POA: Insufficient documentation

## 2014-02-01 DIAGNOSIS — Z79899 Other long term (current) drug therapy: Secondary | ICD-10-CM | POA: Insufficient documentation

## 2014-02-01 DIAGNOSIS — N23 Unspecified renal colic: Secondary | ICD-10-CM | POA: Insufficient documentation

## 2014-02-01 DIAGNOSIS — Z8619 Personal history of other infectious and parasitic diseases: Secondary | ICD-10-CM | POA: Insufficient documentation

## 2014-02-01 DIAGNOSIS — J45909 Unspecified asthma, uncomplicated: Secondary | ICD-10-CM | POA: Insufficient documentation

## 2014-02-01 DIAGNOSIS — Z8742 Personal history of other diseases of the female genital tract: Secondary | ICD-10-CM | POA: Insufficient documentation

## 2014-02-01 DIAGNOSIS — R1110 Vomiting, unspecified: Secondary | ICD-10-CM

## 2014-02-01 DIAGNOSIS — I10 Essential (primary) hypertension: Secondary | ICD-10-CM

## 2014-02-01 LAB — URINALYSIS, ROUTINE W REFLEX MICROSCOPIC
BILIRUBIN URINE: NEGATIVE
Glucose, UA: NEGATIVE mg/dL
Hgb urine dipstick: NEGATIVE
Ketones, ur: NEGATIVE mg/dL
LEUKOCYTES UA: NEGATIVE
NITRITE: NEGATIVE
PH: 7.5 (ref 5.0–8.0)
Protein, ur: NEGATIVE mg/dL
SPECIFIC GRAVITY, URINE: 1.014 (ref 1.005–1.030)
Urobilinogen, UA: 0.2 mg/dL (ref 0.0–1.0)

## 2014-02-01 LAB — BASIC METABOLIC PANEL
BUN: 8 mg/dL (ref 6–23)
CALCIUM: 9.2 mg/dL (ref 8.4–10.5)
CO2: 24 mEq/L (ref 19–32)
CREATININE: 0.9 mg/dL (ref 0.50–1.10)
Chloride: 102 mEq/L (ref 96–112)
GFR, EST NON AFRICAN AMERICAN: 81 mL/min — AB (ref >90)
Glucose, Bld: 88 mg/dL (ref 70–99)
Potassium: 4.6 mEq/L (ref 3.7–5.3)
Sodium: 139 mEq/L (ref 137–147)

## 2014-02-01 LAB — PREGNANCY, URINE: Preg Test, Ur: NEGATIVE

## 2014-02-01 MED ORDER — HYDROMORPHONE HCL PF 1 MG/ML IJ SOLN
1.0000 mg | Freq: Once | INTRAMUSCULAR | Status: AC
  Administered 2014-02-01: 1 mg via INTRAVENOUS
  Filled 2014-02-01: qty 1

## 2014-02-01 MED ORDER — MORPHINE SULFATE 4 MG/ML IJ SOLN
4.0000 mg | Freq: Once | INTRAMUSCULAR | Status: AC
  Administered 2014-02-01: 4 mg via INTRAVENOUS
  Filled 2014-02-01: qty 1

## 2014-02-01 MED ORDER — PROMETHAZINE HCL 25 MG/ML IJ SOLN
12.5000 mg | Freq: Once | INTRAMUSCULAR | Status: AC
  Administered 2014-02-01: 12.5 mg via INTRAVENOUS
  Filled 2014-02-01: qty 1

## 2014-02-01 MED ORDER — HYDROCODONE-ACETAMINOPHEN 5-325 MG PO TABS: 1.0000 | ORAL_TABLET | ORAL | Status: DC | PRN

## 2014-02-01 MED ORDER — PROMETHAZINE HCL 25 MG PO TABS: 25.0000 mg | ORAL_TABLET | Freq: Four times a day (QID) | ORAL | Status: DC | PRN

## 2014-02-01 MED ORDER — SODIUM CHLORIDE 0.9 % IV BOLUS (SEPSIS)
1000.0000 mL | Freq: Once | INTRAVENOUS | Status: AC
  Administered 2014-02-01: 1000 mL via INTRAVENOUS

## 2014-02-01 NOTE — ED Provider Notes (Signed)
CSN: [id number]     Arrival date & time 02/01/14  1538 History   First MD Initiated Contact with Patient 02/01/14 1606     Chief Complaint  Patient presents with  . Urinary Frequency     (Consider location/radiation/quality/duration/timing/severity/associated sxs/prior Treatment) HPI Comments: Pt state that she is having urinary frequency and right flank pain. Pt denies fever, vomiting, or diarrhea. Pt has a history of kidney stones and this feels similar. Pt states that she has had multiple ct scans in the last. Pt states that she hasn't seen urology in a year because she lost her insurance.  The history is provided by the patient. No language interpreter was used.    Past Medical History  Diagnosis Date  . Hypertension   . Chronic UTI   . Complication of anesthesia   . Asthma   . Kidney stones   . Pyelonephritis, chronic   . Ovarian cyst   . Bacterial vaginosis   . Candidal vaginitis    Past Surgical History  Procedure Laterality Date  . Lithotripsy  2000's  . Cystoscopy  2001-2012    "total of 11; for chronic chronic pyelonephritis"  . Tubal ligation  2002  . Nephrostomy     History reviewed. No pertinent family history. History  Substance Use Topics  . Smoking status: Current Every Day Smoker -- 0.25 packs/day for 18 years    Types: Cigarettes  . Smokeless tobacco: Never Used  . Alcohol Use: No   OB History   Grav Para Term Preterm Abortions TAB SAB Ect Mult Living                 Review of Systems  Constitutional: Negative.   Respiratory: Negative.   Cardiovascular: Negative.       Allergies  Flagyl; Ketorolac tromethamine; Metoclopramide hcl; Percocet; and Zofran  Home Medications   Prior to Admission medications   Medication Sig Start Date End Date Taking? Authorizing Provider  albuterol (PROVENTIL HFA;VENTOLIN HFA) 108 (90 BASE) MCG/ACT inhaler Inhale 2 puffs into the lungs every 6 (six) hours as needed for wheezing. For wheezing    Historical  Provider, MD  BIOTIN PO Take 1 tablet by mouth daily.    Historical Provider, MD  ciprofloxacin (CIPRO) 750 MG tablet Take 1 tablet (750 mg total) by mouth 2 (two) times daily. 12/07/12   Tora KindredMarianne L York, PA-C  clindamycin (CLEOCIN) 150 MG capsule Take 1 capsule (150 mg total) by mouth every 6 (six) hours. 10/26/13   Hope Orlene OchM Neese, NP  fluconazole (DIFLUCAN) 150 MG tablet Take 1 tablet (150 mg total) by mouth once. 12/07/12   Tora KindredMarianne L York, PA-C  hydrochlorothiazide (HYDRODIURIL) 12.5 MG tablet Take 12.5 mg by mouth daily.    Historical Provider, MD  HYDROcodone-acetaminophen (NORCO/VICODIN) 5-325 MG per tablet Take 1 tablet by mouth every 6 (six) hours as needed. 12/07/12   Stephani PoliceMarianne L York, PA-C  HYDROcodone-acetaminophen (NORCO/VICODIN) 5-325 MG per tablet Take 1 tablet by mouth every 6 (six) hours as needed for moderate pain. 10/26/13   Hope Orlene OchM Neese, NP  metoprolol succinate (TOPROL-XL) 25 MG 24 hr tablet Take 25 mg by mouth daily.    Historical Provider, MD  metoprolol succinate (TOPROL-XL) 25 MG 24 hr tablet Take 1 tablet (25 mg total) by mouth daily. 11/04/13   April K Palumbo-Rasch, MD  metoprolol tartrate (LOPRESSOR) 25 MG tablet Take 12.5 mg by mouth 2 (two) times daily.    Historical Provider, MD  ondansetron (ZOFRAN) 4 MG tablet  Take 1 tablet (4 mg total) by mouth every 6 (six) hours as needed for nausea. 12/07/12   Tora KindredMarianne L York, PA-C  promethazine (PHENERGAN) 25 MG tablet Take 1 tablet (25 mg total) by mouth every 6 (six) hours as needed for nausea or vomiting. 10/26/13   Hope Orlene OchM Neese, NP  sulfamethoxazole-trimethoprim (SEPTRA DS) 800-160 MG per tablet Take 1 tablet by mouth every 12 (twelve) hours. 10/26/13   Hope Orlene OchM Neese, NP  zolpidem (AMBIEN) 5 MG tablet Take 5 mg by mouth at bedtime as needed for sleep.    Historical Provider, MD   BP 158/117  Pulse 95  Temp(Src) 98.3 F (36.8 C) (Oral)  Resp 16  Ht 5\' 5"  (1.651 m)  Wt 218 lb (98.884 kg)  BMI 36.28 kg/m2  SpO2 99%  LMP  01/18/2014 Physical Exam  Nursing note and vitals reviewed. Constitutional: She is oriented to person, place, and time. She appears well-developed and well-nourished.  HENT:  Head: Normocephalic and atraumatic.  Cardiovascular: Normal rate and regular rhythm.   Pulmonary/Chest: Effort normal and breath sounds normal.  Abdominal: Soft. Bowel sounds are normal. There is no tenderness.  Musculoskeletal: Normal range of motion.  Neurological: She is alert and oriented to person, place, and time. Coordination normal.  Skin: Skin is warm and dry.  Psychiatric: She has a normal mood and affect.    ED Course  Procedures (including critical care time) Labs Review Labs Reviewed  URINALYSIS, ROUTINE W REFLEX MICROSCOPIC - Abnormal; Notable for the following:    APPearance CLOUDY (*)    All other components within normal limits  BASIC METABOLIC PANEL - Abnormal; Notable for the following:    GFR calc non Af Amer 81 (*)    All other components within normal limits  PREGNANCY, URINE    Imaging Review Dg Abd 1 View  02/01/2014   CLINICAL DATA:  Urinary frequency.  EXAM: ABDOMEN - 1 VIEW  COMPARISON:  CT ABD-PELV W/O CM dated 09/26/2013  FINDINGS: No disproportionate dilatation of bowel. No obvious free intraperitoneal gas. Tiny calcific densities project over the lower pole of the right kidney compatible with nephrolithiasis. Left pelvic calcification is consistent with a phlebolith.  IMPRESSION: Right nephrolithiasis.  Nonobstructive bowel gas pattern.   Electronically Signed   By: Maryclare BeanArt  Hoss M.D.   On: 02/01/2014 17:54     EKG Interpretation None      MDM   Final diagnoses:  Renal colic  HTN (hypertension)  Vomiting    Likely renal colic. Pt is feeling better at this time. Pt urine without infection. Will send home on something for hydrocodone and phenergan. Pt states that she thinks her bp medication was not kept down with the vomiting earlier    Teressa LowerVrinda Amulya Quintin, NP 02/01/14  1842

## 2014-02-01 NOTE — ED Provider Notes (Signed)
Medical screening examination/treatment/procedure(s) were performed by non-physician practitioner and as supervising physician I was immediately available for consultation/collaboration.   EKG Interpretation None        William Margurite Duffy, MD 02/01/14 1953 

## 2014-02-01 NOTE — Discharge Instructions (Signed)
Follow up with your urologist as discussed Kidney Stones Kidney stones (urolithiasis) are deposits that form inside your kidneys. The intense pain is caused by the stone moving through the urinary tract. When the stone moves, the ureter goes into spasm around the stone. The stone is usually passed in the urine.  CAUSES   A disorder that makes certain neck glands produce too much parathyroid hormone (primary hyperparathyroidism).  A buildup of uric acid crystals, similar to gout in your joints.  Narrowing (stricture) of the ureter.  A kidney obstruction present at birth (congenital obstruction).  Previous surgery on the kidney or ureters.  Numerous kidney infections. SYMPTOMS   Feeling sick to your stomach (nauseous).  Throwing up (vomiting).  Blood in the urine (hematuria).  Pain that usually spreads (radiates) to the groin.  Frequency or urgency of urination. DIAGNOSIS   Taking a history and physical exam.  Blood or urine tests.  CT scan.  Occasionally, an examination of the inside of the urinary bladder (cystoscopy) is performed. TREATMENT   Observation.  Increasing your fluid intake.  Extracorporeal shock wave lithotripsy This is a noninvasive procedure that uses shock waves to break up kidney stones.  Surgery may be needed if you have severe pain or persistent obstruction. There are various surgical procedures. Most of the procedures are performed with the use of small instruments. Only small incisions are needed to accommodate these instruments, so recovery time is minimized. The size, location, and chemical composition are all important variables that will determine the proper choice of action for you. Talk to your health care provider to better understand your situation so that you will minimize the risk of injury to yourself and your kidney.  HOME CARE INSTRUCTIONS   Drink enough water and fluids to keep your urine clear or pale yellow. This will help you to  pass the stone or stone fragments.  Strain all urine through the provided strainer. Keep all particulate matter and stones for your health care provider to see. The stone causing the pain may be as small as a grain of salt. It is very important to use the strainer each and every time you pass your urine. The collection of your stone will allow your health care provider to analyze it and verify that a stone has actually passed. The stone analysis will often identify what you can do to reduce the incidence of recurrences.  Only take over-the-counter or prescription medicines for pain, discomfort, or fever as directed by your health care provider.  Make a follow-up appointment with your health care provider as directed.  Get follow-up X-rays if required. The absence of pain does not always mean that the stone has passed. It may have only stopped moving. If the urine remains completely obstructed, it can cause loss of kidney function or even complete destruction of the kidney. It is your responsibility to make sure X-rays and follow-ups are completed. Ultrasounds of the kidney can show blockages and the status of the kidney. Ultrasounds are not associated with any radiation and can be performed easily in a matter of minutes. SEEK MEDICAL CARE IF:  You experience pain that is progressive and unresponsive to any pain medicine you have been prescribed. SEEK IMMEDIATE MEDICAL CARE IF:   Pain cannot be controlled with the prescribed medicine.  You have a fever or shaking chills.  The severity or intensity of pain increases over 18 hours and is not relieved by pain medicine.  You develop a new onset of abdominal  pain.  You feel faint or pass out.  You are unable to urinate. MAKE SURE YOU:   Understand these instructions.  Will watch your condition.  Will get help right away if you are not doing well or get worse. Document Released: 10/05/2005 Document Revised: 06/07/2013 Document Reviewed:  03/08/2013 [facility] Patient Information 2014 [place], [place].

## 2014-02-01 NOTE — ED Notes (Signed)
Pt c/o urine freq and pain x 2 days

## 2014-02-16 ENCOUNTER — Ambulatory Visit (INDEPENDENT_AMBULATORY_CARE_PROVIDER_SITE_OTHER): Payer: BC Managed Care – PPO | Admitting: Family Medicine

## 2014-02-16 VITALS — BP 158/108 | HR 94 | Temp 98.4°F | Resp 18 | Ht 64.0 in | Wt 257.0 lb

## 2014-02-16 DIAGNOSIS — I10 Essential (primary) hypertension: Secondary | ICD-10-CM

## 2014-02-16 DIAGNOSIS — G4700 Insomnia, unspecified: Secondary | ICD-10-CM

## 2014-02-16 DIAGNOSIS — Z87442 Personal history of urinary calculi: Secondary | ICD-10-CM

## 2014-02-16 MED ORDER — METOPROLOL SUCCINATE ER 25 MG PO TB24: 25.0000 mg | ORAL_TABLET | Freq: Every day | ORAL | Status: DC

## 2014-02-16 MED ORDER — HYDROCHLOROTHIAZIDE 12.5 MG PO TABS: 12.5000 mg | ORAL_TABLET | Freq: Every day | ORAL | Status: DC

## 2014-02-16 MED ORDER — ZOLPIDEM TARTRATE 5 MG PO TABS: 5.0000 mg | ORAL_TABLET | Freq: Every evening | ORAL | Status: DC | PRN

## 2014-02-16 NOTE — Progress Notes (Signed)
[facility] [address], [place] [address] [phone]  Date:  02/16/2014   Name:  [name]   DOB:  January 01, 1976   MRN:  [id number]  PCP:  Doreen SalvageBULLA, DONALD, MD    Chief Complaint: Medication Problem and Insomnia   History of Present Illness:  [name] is a 38 y.o. very pleasant female patient who presents with the following:  She is here today to discuss her HTN and insomnia.  She is currently taking 12.5 mg of metoprolol tartrate BID.  She had been on toprol xl 25 daily and this seemed to work better.  She may check her BP at home and it does tend to run as high as it is today.  She may feel lightheaded some of the time.  However no chest pain or SOB.   The cuase of her HTN is unknown.  She had been on HCTZ as well until about 3 months ago when she ran out  She has a history of pyelo, kidney stones, she has had a lot of trouble with her right kidney.  [facility]   However her old urologist is no longer there and she [place]   She also has a history of insomnia.  She has tried melatonin, benadryl. However these measures do not help her much.  She has a hard time falling asleep, and tends to wake up after 2 or 3 hours.    She used [place] in [place] past- it worked well for her generally. She last used it several months ago.   Her LMP is current, and she has a BTL.  She is married, lives with her husband and children.    Patient Active Problem List   Diagnosis Date Noted  . Vomiting 04/21/2012  . Pyelonephritis 04/20/2012  . HTN (hypertension) 04/20/2012  . Asthma 04/20/2012    Past Medical History  Diagnosis Date  . Hypertension   . Chronic UTI   . Complication of anesthesia   . Asthma   . Kidney stones   . Pyelonephritis, chronic   . Ovarian cyst   . Bacterial vaginosis   . Candidal vaginitis     Past Surgical History  Procedure Laterality Date  .  Lithotripsy  2000's  . Cystoscopy  2001-2012    "total of 11; for chronic chronic pyelonephritis"  . Tubal ligation  2002  . Nephrostomy      History  Substance Use Topics  . Smoking status: Current Every Day Smoker -- 0.25 packs/day for 18 years    Types: Cigarettes  . Smokeless tobacco: Never Used  . Alcohol Use: No    Family History  Problem Relation Age of Onset  . HIV/AIDS Mother   . Hypertension Father     Allergies  Allergen Reactions  . Flagyl [Metronidazole Hcl] Hives  . Ketorolac Tromethamine Hives  . Metoclopramide Hcl Other (See Comments)    Facial spasm; "they thought I was having a stroke"  . Percocet [Oxycodone-Acetaminophen] Hives and Itching  . Zofran [Ondansetron Hcl] Hives    Medication list has been reviewed and updated.  Current Outpatient Prescriptions on File Prior to Visit  Medication Sig Dispense Refill  . albuterol (PROVENTIL HFA;VENTOLIN HFA) 108 (90 BASE) MCG/ACT inhaler Inhale 2 puffs into the lungs every 6 (six) hours as needed for wheezing. For wheezing      . metoprolol succinate (TOPROL-XL) 25  MG 24 hr tablet Take 25 mg by mouth daily.      . metoprolol succinate (TOPROL-XL) 25 MG 24 hr tablet Take 1 tablet (25 mg total) by mouth daily.  15 tablet  0  . BIOTIN PO Take 1 tablet by mouth daily.      . ciprofloxacin (CIPRO) 750 MG tablet Take 1 tablet (750 mg total) by mouth 2 (two) times daily.  14 tablet  0  . clindamycin (CLEOCIN) 150 MG capsule Take 1 capsule (150 mg total) by mouth every 6 (six) hours.  28 capsule  0  . fluconazole (DIFLUCAN) 150 MG tablet Take 1 tablet (150 mg total) by mouth once.  1 tablet  0  . hydrochlorothiazide (HYDRODIURIL) 12.5 MG tablet Take 12.5 mg by mouth daily.      Marland Kitchen. HYDROcodone-acetaminophen (NORCO/VICODIN) 5-325 MG per tablet Take 1 tablet by mouth every 6 (six) hours as needed.  20 tablet  0  . HYDROcodone-acetaminophen (NORCO/VICODIN) 5-325 MG per tablet Take 1 tablet by mouth every 6 (six) hours as  needed for moderate pain.  30 tablet  0  . HYDROcodone-acetaminophen (NORCO/VICODIN) 5-325 MG per tablet Take 1-2 tablets by mouth every 4 (four) hours as needed.  15 tablet  0  . metoprolol tartrate (LOPRESSOR) 25 MG tablet Take 12.5 mg by mouth 2 (two) times daily.      . ondansetron (ZOFRAN) 4 MG tablet Take 1 tablet (4 mg total) by mouth every 6 (six) hours as needed for nausea.  20 tablet  0  . promethazine (PHENERGAN) 25 MG tablet Take 1 tablet (25 mg total) by mouth every 6 (six) hours as needed for nausea or vomiting.  20 tablet  0  . promethazine (PHENERGAN) 25 MG tablet Take 1 tablet (25 mg total) by mouth every 6 (six) hours as needed for nausea or vomiting.  30 tablet  0  . sulfamethoxazole-trimethoprim (SEPTRA DS) 800-160 MG per tablet Take 1 tablet by mouth every 12 (twelve) hours.  14 tablet  0  . zolpidem (AMBIEN) 5 MG tablet Take 5 mg by mouth at bedtime as needed for sleep.       No current facility-administered medications on file prior to visit.    Review of Systems:  As per HPI- otherwise negative.   Physical Examination: Filed Vitals:   02/16/14 1711  BP: 168/114  Pulse: 94  Temp: 98.4 F (36.9 C)  Resp: 18   Filed Vitals:   02/16/14 1711  Height: 5\' 4"  (1.626 m)  Weight: 257 lb (116.574 kg)   Body mass index is 44.09 kg/(m^2). Ideal Body Weight: Weight in (lb) to have BMI = 25: 145.3  GEN: WDWN, NAD, Non-toxic, A & O x 3, obese, looks well HEENT: Atraumatic, Normocephalic. Neck supple. No masses, No LAD. Ears and Nose: No external deformity. CV: RRR, No M/G/R. No JVD. No thrill. No extra heart sounds. PULM: CTA B, no wheezes, crackles, rhonchi. No retractions. No resp. distress. No accessory muscle use. EXTR: No c/c/e NEURO Normal gait.  PSYCH: Normally interactive. Conversant. Not depressed or anxious appearing.  Calm demeanor.    Assessment and Plan: HTN (hypertension) - Plan: hydrochlorothiazide (HYDRODIURIL) 12.5 MG tablet, Basic metabolic  panel, metoprolol succinate (TOPROL-XL) 25 MG 24 hr tablet, DISCONTINUED: metoprolol succinate (TOPROL-XL) 25 MG 24 hr tablet  Insomnia - Plan: zolpidem (AMBIEN) 5 MG tablet  History of kidney stones - Plan: Ambulatory referral to Urology  Changed from metoprolol tartrate to toprol xl25 once a day, and add  back HCTZ.  She will check her BP here at [facility] and keep me posted.  Discussed ambien in detail.  See patient instructions for more details.     Signed Abbe Amsterdam, MD

## 2014-02-16 NOTE — Patient Instructions (Signed)
Start back on the HCTZ and continue metoprolol (25 mg once a day) for your blood pressure.  Please have a BMP drawn in about one month.  Also, please check your BP here at work over the next couple of weeks and let me know how it looks- you can send me a staff message.    You can use the [place] as needed; try to avoid using it every day.  Remember to allow at least 8 hours before you need to be active or drive.  Do NOT drive after taking this medication and do not take it with alcohol.    Take care!

## 2014-04-11 ENCOUNTER — Telehealth: Payer: Self-pay | Admitting: Family Medicine

## 2014-04-11 DIAGNOSIS — G4700 Insomnia, unspecified: Secondary | ICD-10-CM

## 2014-04-11 MED ORDER — ZOLPIDEM TARTRATE 5 MG PO TABS: 5.0000 mg | ORAL_TABLET | Freq: Every evening | ORAL | Status: DC | PRN

## 2014-04-11 NOTE — Telephone Encounter (Signed)
Pt asked me for a RF of her ambien.  Will do this for her

## 2014-04-14 ENCOUNTER — Other Ambulatory Visit: Payer: Self-pay | Admitting: Family Medicine

## 2014-04-14 NOTE — Telephone Encounter (Signed)
Sent in

## 2014-05-09 ENCOUNTER — Ambulatory Visit (INDEPENDENT_AMBULATORY_CARE_PROVIDER_SITE_OTHER): Payer: BC Managed Care – PPO | Admitting: Family Medicine

## 2014-05-09 VITALS — BP 110/80 | HR 71 | Temp 97.2°F | Resp 16 | Ht 65.5 in | Wt 243.0 lb

## 2014-05-09 DIAGNOSIS — R109 Unspecified abdominal pain: Secondary | ICD-10-CM

## 2014-05-09 DIAGNOSIS — Z13220 Encounter for screening for lipoid disorders: Secondary | ICD-10-CM

## 2014-05-09 DIAGNOSIS — H538 Other visual disturbances: Secondary | ICD-10-CM

## 2014-05-09 LAB — POCT CBC
Granulocyte percent: 47.5 %G (ref 37–80)
HCT, POC: 36.4 % — AB (ref 37.7–47.9)
Hemoglobin: 11.8 g/dL — AB (ref 12.2–16.2)
LYMPH, POC: 2.6 (ref 0.6–3.4)
MCH, POC: 31 pg (ref 27–31.2)
MCHC: 32.4 g/dL (ref 31.8–35.4)
MCV: 95.4 fL (ref 80–97)
MID (CBC): 0.2 (ref 0–0.9)
MPV: 7.8 fL (ref 0–99.8)
POC Granulocyte: 2.5 (ref 2–6.9)
POC LYMPH %: 49.6 % (ref 10–50)
POC MID %: 2.9 %M (ref 0–12)
Platelet Count, POC: 280 10*3/uL (ref 142–424)
RBC: 3.81 M/uL — AB (ref 4.04–5.48)
RDW, POC: 15 %
WBC: 5.3 10*3/uL (ref 4.6–10.2)

## 2014-05-09 LAB — POCT UA - MICROSCOPIC ONLY
CRYSTALS, UR, HPF, POC: NEGATIVE
Casts, Ur, LPF, POC: NEGATIVE
MUCUS UA: NEGATIVE
RBC, urine, microscopic: NEGATIVE
YEAST UA: NEGATIVE

## 2014-05-09 LAB — POCT URINALYSIS DIPSTICK
Bilirubin, UA: NEGATIVE
Glucose, UA: NEGATIVE
Ketones, UA: NEGATIVE
LEUKOCYTES UA: NEGATIVE
Nitrite, UA: NEGATIVE
PH UA: 5.5
PROTEIN UA: NEGATIVE
RBC UA: NEGATIVE
Spec Grav, UA: 1.025
Urobilinogen, UA: 0.2

## 2014-05-09 LAB — POCT GLYCOSYLATED HEMOGLOBIN (HGB A1C): Hemoglobin A1C: 4.8

## 2014-05-09 LAB — GLUCOSE, POCT (MANUAL RESULT ENTRY): POC Glucose: 93 mg/dl (ref 70–99)

## 2014-05-09 LAB — POCT URINE PREGNANCY: Preg Test, Ur: NEGATIVE

## 2014-05-09 MED ORDER — CIPROFLOXACIN HCL 500 MG PO TABS: 500.0000 mg | ORAL_TABLET | Freq: Two times a day (BID) | ORAL | Status: DC

## 2014-05-09 NOTE — Progress Notes (Addendum)
[facility] [address], [place] [address] [phone]  Date:  05/09/2014   Name:  [name]   DOB:  February 06, 1976   MRN:  [id number]  PCP:  Doreen Salvage, MD    Chief Complaint: Blurred Vision and Abdominal Pain   History of Present Illness:  [name] is a 38 y.o. very pleasant female patient who presents with the following:  History of HTN, obesity, kidney stones, chronic pyelo s/p nephrostomy tube.  Here today with a concern that her blood sugar may be high Notes that when she looks up quickly she notes "blurries and wiggily lines."  She has noted this for about one month.  She does not get this daily- it is sporadic. Usually occurs after she has been looking at a computer for a while.  May last for 5- 6 minutes and seems to be present in both eyes.    This happened again this am and we checked her fasting glucose- it was at 170.  However she now remembers drinking some grape juice during the night so this was actually not a fasting number.    She also has noted right flank pains for about one week.  She has not noted any frequency.  Her urine looks "brown," but she has not noted any hematuria.  Otherwise she does not have any UTI sx such as dysuria, but states that often these sx are absent she would like to do a culture- this is fine.    No headaches.  BP looks fine.  Eating ok.  No abdominal pain.   She has felt nauseated but is not vomiting.  She has some phenergan suppositories that she will use as needed She is supposed to wear glasses to read, but does not generally use them.  She had her eyes examined about 5 months ago and all looked ok.    LMP about 2 weeks ago  Patient Active Problem List   Diagnosis Date Noted  . Vomiting 04/21/2012  . Pyelonephritis 04/20/2012  . HTN (hypertension) 04/20/2012  . Asthma 04/20/2012    Past Medical History  Diagnosis Date  . Hypertension   . Chronic UTI   . Complication of anesthesia   .  Asthma   . Kidney stones   . Pyelonephritis, chronic   . Ovarian cyst   . Bacterial vaginosis   . Candidal vaginitis     Past Surgical History  Procedure Laterality Date  . Lithotripsy  2000's  . Cystoscopy  2001-2012    "total of 11; for chronic chronic pyelonephritis"  . Tubal ligation  2002  . Nephrostomy      History  Substance Use Topics  . Smoking status: Current Every Day Smoker -- 0.25 packs/day for 18 years    Types: Cigarettes  . Smokeless tobacco: Never Used  . Alcohol Use: No    Family History  Problem Relation Age of Onset  . HIV/AIDS Mother   . Hypertension Father     Allergies  Allergen Reactions  . Flagyl [Metronidazole Hcl] Hives  . Ketorolac Tromethamine Hives  . Metoclopramide Hcl Other (See Comments)    Facial spasm; "they thought I was having a stroke"  . Percocet [Oxycodone-Acetaminophen] Hives and Itching  . Zofran [Ondansetron Hcl] Hives    Medication list has been reviewed and updated.  Current Outpatient Prescriptions on File Prior to Visit  Medication Sig Dispense Refill  . albuterol (PROVENTIL HFA;VENTOLIN HFA) 108 (90 BASE) MCG/ACT inhaler  Inhale 2 puffs into the lungs every 6 (six) hours as needed for wheezing. For wheezing      . hydrochlorothiazide (HYDRODIURIL) 12.5 MG tablet Take 1 tablet (12.5 mg total) by mouth daily.  30 tablet  5  . metoprolol succinate (TOPROL-XL) 25 MG 24 hr tablet Take 1 tablet (25 mg total) by mouth daily.  30 tablet  5  . zolpidem (AMBIEN) 5 MG tablet Take 1 tablet (5 mg total) by mouth at bedtime as needed for sleep.  30 tablet  0   No current facility-administered medications on file prior to visit.    Review of Systems:  As per HPI- otherwise negative.   Physical Examination: Filed Vitals:   05/09/14 1312  BP: 110/80  Pulse: 71  Temp: 97.2 F (36.2 C)  Resp: 16   Filed Vitals:   05/09/14 1312  Height: 5' 5.5" (1.664 m)  Weight: 243 lb (110.224 kg)   Body mass index is 39.81  kg/(m^2). Ideal Body Weight: Weight in (lb) to have BMI = 25: 152.2  GEN: WDWN, NAD, Non-toxic, A & O x 3, obese, looks well HEENT: Atraumatic, Normocephalic. Neck supple. No masses, No LAD. Bilateral TM wnl, oropharynx normal.  PEERL,EOMI.   Fundoscopic exam wnl Ears and Nose: No external deformity. CV: RRR, No M/G/R. No JVD. No thrill. No extra heart sounds. PULM: CTA B, no wheezes, crackles, rhonchi. No retractions. No resp. distress. No accessory muscle use. ABD: S, NT, ND, +BS. No rebound. No HSM.  She has scarring over her right kidney from nephrostomy tube.  Not able to reproduce tenderness over her abdomen or back however EXTR: No c/c/e NEURO Normal gait. Normal movement of all limbs, normal balance PSYCH: Normally interactive. Conversant. Not depressed or anxious appearing.  Calm demeanor.   Results for orders placed in visit on 05/09/14  URINE CULTURE      Result Value Ref Range   Colony Count 50,000 COLONIES/ML     Organism ID, Bacteria Multiple bacterial morphotypes present, none     Organism ID, Bacteria predominant. Suggest appropriate recollection if      Organism ID, Bacteria clinically indicated.    COMPREHENSIVE METABOLIC PANEL      Result Value Ref Range   Sodium 135  135 - 145 mEq/L   Potassium 4.4  3.5 - 5.3 mEq/L   Chloride 105  96 - 112 mEq/L   CO2 22  19 - 32 mEq/L   Glucose, Bld 86  70 - 99 mg/dL   BUN 10  6 - 23 mg/dL   Creat 1.610.89  0.960.50 - 0.451.10 mg/dL   Total Bilirubin 0.7  0.2 - 1.2 mg/dL   Alkaline Phosphatase 56  39 - 117 U/L   AST 14  0 - 37 U/L   ALT 11  0 - 35 U/L   Total Protein 7.3  6.0 - 8.3 g/dL   Albumin 4.2  3.5 - 5.2 g/dL   Calcium 8.9  8.4 - 40.910.5 mg/dL  TSH      Result Value Ref Range   TSH 0.568  0.350 - 4.500 uIU/mL  LIPID PANEL      Result Value Ref Range   Cholesterol 161  0 - 200 mg/dL   Triglycerides 96  <811<150 mg/dL   HDL 37 (*) >91>39 mg/dL   Total CHOL/HDL Ratio 4.4     VLDL 19  0 - 40 mg/dL   LDL Cholesterol 478105 (*) 0 - 99  mg/dL  POCT GLYCOSYLATED HEMOGLOBIN (HGB  A1C)      Result Value Ref Range   Hemoglobin A1C 4.8    GLUCOSE, POCT (MANUAL RESULT ENTRY)      Result Value Ref Range   POC Glucose 93  70 - 99 mg/dl  POCT UA - MICROSCOPIC ONLY      Result Value Ref Range   WBC, Ur, HPF, POC 8-15     RBC, urine, microscopic neg     Bacteria, U Microscopic trace     Mucus, UA neg     Epithelial cells, urine per micros TNTC     Crystals, Ur, HPF, POC neg     Casts, Ur, LPF, POC neg     Yeast, UA neg    POCT URINALYSIS DIPSTICK      Result Value Ref Range   Color, UA yellow     Clarity, UA clear     Glucose, UA neg     Bilirubin, UA neg     Ketones, UA neg     Spec Grav, UA 1.025     Blood, UA neg     pH, UA 5.5     Protein, UA neg     Urobilinogen, UA 0.2     Nitrite, UA neg     Leukocytes, UA Negative    POCT URINE PREGNANCY      Result Value Ref Range   Preg Test, Ur Negative    POCT CBC      Result Value Ref Range   WBC 5.3  4.6 - 10.2 K/uL   Lymph, poc 2.6  0.6 - 3.4   POC LYMPH PERCENT 49.6  10 - 50 %L   MID (cbc) 0.2  0 - 0.9   POC MID % 2.9  0 - 12 %M   POC Granulocyte 2.5  2 - 6.9   Granulocyte percent 47.5  37 - 80 %G   RBC 3.81 (*) 4.04 - 5.48 M/uL   Hemoglobin 11.8 (*) 12.2 - 16.2 g/dL   HCT, POC 16.1 (*) 09.6 - 47.9 %   MCV 95.4  80 - 97 fL   MCH, POC 31.0  27 - 31.2 pg   MCHC 32.4  31.8 - 35.4 g/dL   RDW, POC 04.5     Platelet Count, POC 280  142 - 424 K/uL   MPV 7.8  0 - 99.8 fL     Assessment and Plan: Blurred vision - Plan: POCT glycosylated hemoglobin (Hb A1C), POCT glucose (manual entry), Comprehensive metabolic panel, TSH  Right flank pain - Plan: POCT UA - Microscopic Only, POCT urinalysis dipstick, POCT urine pregnancy, Urine culture, ciprofloxacin (CIPRO) 500 MG tablet, POCT CBC, Ambulatory referral to Urology  Screening for hyperlipidemia - Plan: Lipid panel  Reassured that her A1c does not indicate DM.  Suspect her sugar was elevated this am due to  drinking juice.  However cautioned her that she is at increased risk for DM due to her family history and weight. Plan to recheck her glucose in about one month.  Also check CMP and FLP today.  Her visual sx are likely benign, but offered an MRI or other imaging if she would like.  For the time being she declines this and will try wearing her glasses for reading and computer use.     Signed Abbe Amsterdam, MD  7/24: discussed the rest of labs with pt at clinic today. Her cholesterol is not bad- would like a higher HDL but overall ok.   Urine culture grew  mixed bacteria- uncertain if true infection.  She is feeling better and will keep me posted as to her progress.

## 2014-05-10 LAB — LIPID PANEL
Cholesterol: 161 mg/dL (ref 0–200)
HDL: 37 mg/dL — ABNORMAL LOW (ref >39)
LDL CALC: 105 mg/dL — AB (ref 0–99)
TRIGLYCERIDES: 96 mg/dL (ref <150)
Total CHOL/HDL Ratio: 4.4 Ratio
VLDL: 19 mg/dL (ref 0–40)

## 2014-05-10 LAB — COMPREHENSIVE METABOLIC PANEL
ALBUMIN: 4.2 g/dL (ref 3.5–5.2)
ALK PHOS: 56 U/L (ref 39–117)
ALT: 11 U/L (ref 0–35)
AST: 14 U/L (ref 0–37)
BUN: 10 mg/dL (ref 6–23)
CALCIUM: 8.9 mg/dL (ref 8.4–10.5)
CHLORIDE: 105 meq/L (ref 96–112)
CO2: 22 mEq/L (ref 19–32)
Creat: 0.89 mg/dL (ref 0.50–1.10)
Glucose, Bld: 86 mg/dL (ref 70–99)
POTASSIUM: 4.4 meq/L (ref 3.5–5.3)
SODIUM: 135 meq/L (ref 135–145)
TOTAL PROTEIN: 7.3 g/dL (ref 6.0–8.3)
Total Bilirubin: 0.7 mg/dL (ref 0.2–1.2)

## 2014-05-10 LAB — TSH: TSH: 0.568 u[IU]/mL (ref 0.350–4.500)

## 2014-05-10 LAB — URINE CULTURE: Colony Count: 50000

## 2014-06-07 ENCOUNTER — Encounter (HOSPITAL_BASED_OUTPATIENT_CLINIC_OR_DEPARTMENT_OTHER): Payer: Self-pay | Admitting: Emergency Medicine

## 2014-06-07 ENCOUNTER — Emergency Department (HOSPITAL_BASED_OUTPATIENT_CLINIC_OR_DEPARTMENT_OTHER): Payer: BC Managed Care – PPO

## 2014-06-07 ENCOUNTER — Emergency Department (HOSPITAL_BASED_OUTPATIENT_CLINIC_OR_DEPARTMENT_OTHER)
Admission: EM | Admit: 2014-06-07 | Discharge: 2014-06-07 | Disposition: A | Discharge status: Home / Self Care | Payer: BC Managed Care – PPO | Attending: Emergency Medicine | Admitting: Emergency Medicine

## 2014-06-07 DIAGNOSIS — Z87440 Personal history of urinary (tract) infections: Secondary | ICD-10-CM | POA: Insufficient documentation

## 2014-06-07 DIAGNOSIS — Z9889 Other specified postprocedural states: Secondary | ICD-10-CM | POA: Insufficient documentation

## 2014-06-07 DIAGNOSIS — Z8742 Personal history of other diseases of the female genital tract: Secondary | ICD-10-CM | POA: Insufficient documentation

## 2014-06-07 DIAGNOSIS — R109 Unspecified abdominal pain: Secondary | ICD-10-CM | POA: Diagnosis present

## 2014-06-07 DIAGNOSIS — Z3202 Encounter for pregnancy test, result negative: Secondary | ICD-10-CM | POA: Insufficient documentation

## 2014-06-07 DIAGNOSIS — F17200 Nicotine dependence, unspecified, uncomplicated: Secondary | ICD-10-CM | POA: Insufficient documentation

## 2014-06-07 DIAGNOSIS — Z8619 Personal history of other infectious and parasitic diseases: Secondary | ICD-10-CM | POA: Diagnosis not present

## 2014-06-07 DIAGNOSIS — R112 Nausea with vomiting, unspecified: Secondary | ICD-10-CM | POA: Insufficient documentation

## 2014-06-07 DIAGNOSIS — J45909 Unspecified asthma, uncomplicated: Secondary | ICD-10-CM | POA: Insufficient documentation

## 2014-06-07 DIAGNOSIS — Z936 Other artificial openings of urinary tract status: Secondary | ICD-10-CM | POA: Diagnosis not present

## 2014-06-07 DIAGNOSIS — Z792 Long term (current) use of antibiotics: Secondary | ICD-10-CM | POA: Insufficient documentation

## 2014-06-07 DIAGNOSIS — Z79899 Other long term (current) drug therapy: Secondary | ICD-10-CM | POA: Insufficient documentation

## 2014-06-07 DIAGNOSIS — I10 Essential (primary) hypertension: Secondary | ICD-10-CM | POA: Insufficient documentation

## 2014-06-07 DIAGNOSIS — R1011 Right upper quadrant pain: Secondary | ICD-10-CM | POA: Insufficient documentation

## 2014-06-07 DIAGNOSIS — Z87442 Personal history of urinary calculi: Secondary | ICD-10-CM | POA: Diagnosis not present

## 2014-06-07 DIAGNOSIS — Z9851 Tubal ligation status: Secondary | ICD-10-CM | POA: Insufficient documentation

## 2014-06-07 LAB — URINALYSIS, ROUTINE W REFLEX MICROSCOPIC
BILIRUBIN URINE: NEGATIVE
GLUCOSE, UA: NEGATIVE mg/dL
Ketones, ur: 15 mg/dL — AB
Nitrite: NEGATIVE
PROTEIN: 30 mg/dL — AB
Specific Gravity, Urine: 1.024 (ref 1.005–1.030)
Urobilinogen, UA: 1 mg/dL (ref 0.0–1.0)
pH: 6.5 (ref 5.0–8.0)

## 2014-06-07 LAB — CBC WITH DIFFERENTIAL/PLATELET
Basophils Absolute: 0 10*3/uL (ref 0.0–0.1)
Basophils Relative: 0 % (ref 0–1)
Eosinophils Absolute: 0.1 10*3/uL (ref 0.0–0.7)
Eosinophils Relative: 1 % (ref 0–5)
HCT: 35.7 % — ABNORMAL LOW (ref 36.0–46.0)
Hemoglobin: 11.9 g/dL — ABNORMAL LOW (ref 12.0–15.0)
Lymphocytes Relative: 44 % (ref 12–46)
Lymphs Abs: 2.1 10*3/uL (ref 0.7–4.0)
MCH: 31.3 pg (ref 26.0–34.0)
MCHC: 33.3 g/dL (ref 30.0–36.0)
MCV: 93.9 fL (ref 78.0–100.0)
Monocytes Absolute: 0.4 10*3/uL (ref 0.1–1.0)
Monocytes Relative: 8 % (ref 3–12)
Neutro Abs: 2.2 10*3/uL (ref 1.7–7.7)
Neutrophils Relative %: 46 % (ref 43–77)
Platelets: 268 10*3/uL (ref 150–400)
RBC: 3.8 MIL/uL — ABNORMAL LOW (ref 3.87–5.11)
RDW: 12.7 % (ref 11.5–15.5)
WBC: 4.7 10*3/uL (ref 4.0–10.5)

## 2014-06-07 LAB — URINE MICROSCOPIC-ADD ON

## 2014-06-07 LAB — COMPREHENSIVE METABOLIC PANEL
ALBUMIN: 3.8 g/dL (ref 3.5–5.2)
ALT: 9 U/L (ref 0–35)
ANION GAP: 12 (ref 5–15)
AST: 14 U/L (ref 0–37)
Alkaline Phosphatase: 65 U/L (ref 39–117)
BILIRUBIN TOTAL: 0.4 mg/dL (ref 0.3–1.2)
BUN: 13 mg/dL (ref 6–23)
CO2: 24 meq/L (ref 19–32)
Calcium: 9.1 mg/dL (ref 8.4–10.5)
Chloride: 104 mEq/L (ref 96–112)
Creatinine, Ser: 1 mg/dL (ref 0.50–1.10)
GFR calc Af Amer: 83 mL/min — ABNORMAL LOW (ref >90)
GFR, EST NON AFRICAN AMERICAN: 72 mL/min — AB (ref >90)
Glucose, Bld: 99 mg/dL (ref 70–99)
Potassium: 4 mEq/L (ref 3.7–5.3)
Sodium: 140 mEq/L (ref 137–147)
Total Protein: 7.5 g/dL (ref 6.0–8.3)

## 2014-06-07 LAB — LIPASE, BLOOD: Lipase: 20 U/L (ref 11–59)

## 2014-06-07 LAB — PREGNANCY, URINE: Preg Test, Ur: NEGATIVE

## 2014-06-07 MED ORDER — HYDROMORPHONE HCL PF 1 MG/ML IJ SOLN
1.0000 mg | Freq: Once | INTRAMUSCULAR | Status: AC
  Administered 2014-06-07: 1 mg via INTRAVENOUS
  Filled 2014-06-07: qty 1

## 2014-06-07 MED ORDER — PROMETHAZINE HCL 25 MG/ML IJ SOLN
25.0000 mg | Freq: Once | INTRAMUSCULAR | Status: AC
  Administered 2014-06-07: 25 mg via INTRAVENOUS
  Filled 2014-06-07: qty 1

## 2014-06-07 MED ORDER — ONDANSETRON HCL 4 MG/2ML IJ SOLN: 4.0000 mg | Freq: Once | INTRAMUSCULAR | Status: DC

## 2014-06-07 MED ORDER — OXYCODONE-ACETAMINOPHEN 5-325 MG PO TABS: 1.0000 | ORAL_TABLET | ORAL | Status: DC | PRN

## 2014-06-07 MED ORDER — PROMETHAZINE HCL 25 MG PO TABS: 25.0000 mg | ORAL_TABLET | Freq: Four times a day (QID) | ORAL | Status: DC | PRN

## 2014-06-07 MED ORDER — DIPHENHYDRAMINE HCL 25 MG PO CAPS
25.0000 mg | ORAL_CAPSULE | Freq: Once | ORAL | Status: AC
  Administered 2014-06-07: 25 mg via ORAL
  Filled 2014-06-07: qty 1

## 2014-06-07 MED ORDER — SODIUM CHLORIDE 0.9 % IV BOLUS (SEPSIS)
1000.0000 mL | Freq: Once | INTRAVENOUS | Status: AC
  Administered 2014-06-07: 1000 mL via INTRAVENOUS

## 2014-06-07 MED ORDER — KETOROLAC TROMETHAMINE 30 MG/ML IJ SOLN: 30.0000 mg | Freq: Once | INTRAMUSCULAR | Status: DC

## 2014-06-07 MED ORDER — ONDANSETRON 8 MG PO TBDP: 8.0000 mg | ORAL_TABLET | Freq: Three times a day (TID) | ORAL | Status: DC | PRN

## 2014-06-07 NOTE — ED Notes (Signed)
EDP Ray wrote rx for percocet and zofran-pt reports allergy to zofran-EDP Ray has left for the day-discussed case with EDP yao-wrote rx for pherergan

## 2014-06-07 NOTE — ED Notes (Signed)
Pt c/o right flank pain with n/v x 1 week

## 2014-06-07 NOTE — Discharge Instructions (Signed)
Abdominal Pain, Women °Abdominal (stomach, pelvic, or belly) pain can be caused by many things. It is important to tell your doctor: °· The location of the pain. °· Does it come and go or is it present all the time? °· Are there things that start the pain (eating certain foods, exercise)? °· Are there other symptoms associated with the pain (fever, nausea, vomiting, diarrhea)? °All of this is helpful to know when trying to find the cause of the pain. °CAUSES  °· Stomach: virus or bacteria infection, or ulcer. °· Intestine: appendicitis (inflamed appendix), regional ileitis (Crohn's disease), ulcerative colitis (inflamed colon), irritable bowel syndrome, diverticulitis (inflamed diverticulum of the colon), or cancer of the stomach or intestine. °· Gallbladder disease or stones in the gallbladder. °· Kidney disease, kidney stones, or infection. °· Pancreas infection or cancer. °· Fibromyalgia (pain disorder). °· Diseases of the female organs: °¨ Uterus: fibroid (non-cancerous) tumors or infection. °¨ Fallopian tubes: infection or tubal pregnancy. °¨ Ovary: cysts or tumors. °¨ Pelvic adhesions (scar tissue). °¨ Endometriosis (uterus lining tissue growing in the pelvis and on the pelvic organs). °¨ Pelvic congestion syndrome (female organs filling up with blood just before the menstrual period). °¨ Pain with the menstrual period. °¨ Pain with ovulation (producing an egg). °¨ Pain with an IUD (intrauterine device, birth control) in the uterus. °¨ Cancer of the female organs. °· Functional pain (pain not caused by a disease, may improve without treatment). °· Psychological pain. °· Depression. °DIAGNOSIS  °Your doctor will decide the seriousness of your pain by doing an examination. °· Blood tests. °· X-rays. °· Ultrasound. °· CT scan (computed tomography, special type of X-[place]). °· MRI (magnetic resonance imaging). °· Cultures, for infection. °· Barium enema (dye inserted in the large intestine, to better view it with  X-rays). °· Colonoscopy (looking in intestine with a lighted tube). °· Laparoscopy (minor surgery, looking in abdomen with a lighted tube). °· Major abdominal exploratory surgery (looking in abdomen with a large incision). °TREATMENT  °The treatment will depend on the cause of the pain.  °· Many cases can be observed and treated at home. °· Over-the-counter medicines recommended by your caregiver. °· Prescription medicine. °· Antibiotics, for infection. °· Birth control pills, for painful periods or for ovulation pain. °· Hormone treatment, for endometriosis. °· Nerve blocking injections. °· Physical therapy. °· Antidepressants. °· Counseling with a psychologist or psychiatrist. °· Minor or major surgery. °HOME CARE INSTRUCTIONS  °· Do not take laxatives, unless directed by your caregiver. °· Take over-the-counter pain medicine only if ordered by your caregiver. Do not take aspirin because it can cause an upset stomach or bleeding. °· Try a clear liquid diet (broth or water) as ordered by your caregiver. Slowly move to a bland diet, as tolerated, if the pain is related to the stomach or intestine. °· Have a thermometer and take your temperature several times a day, and record it. °· Bed rest and sleep, if it helps the pain. °· Avoid sexual intercourse, if it causes pain. °· Avoid stressful situations. °· Keep your follow-up appointments and tests, as your caregiver orders. °· If the pain does not go away with medicine or surgery, you may try: °¨ Acupuncture. °¨ Relaxation exercises (yoga, meditation). °¨ Group therapy. °¨ Counseling. °SEEK MEDICAL CARE IF:  °· You notice certain foods cause stomach pain. °· Your home care treatment is not helping your pain. °· You need stronger pain medicine. °· You want your IUD removed. °· You feel faint or   lightheaded. °· You develop nausea and vomiting. °· You develop a rash. °· You are having side effects or an allergy to your medicine. °SEEK IMMEDIATE MEDICAL CARE IF:  °· Your  pain does not go away or gets worse. °· You have a fever. °· Your pain is felt only in portions of the abdomen. The right side could possibly be appendicitis. The left lower portion of the abdomen could be colitis or diverticulitis. °· You are passing blood in your stools (bright red or black tarry stools, with or without vomiting). °· You have blood in your urine. °· You develop chills, with or without a fever. °· You pass out. °MAKE SURE YOU:  °· Understand these instructions. °· Will watch your condition. °· Will get help right away if you are not doing well or get worse. °Document Released: 08/02/2007 Document Revised: 02/19/2014 Document Reviewed: 08/22/2009 °[employer]® Patient Information ©2015 [employer]. This information is not intended to replace advice given to you by your health care provider. Make sure you discuss any questions you have with your health care provider. ° °

## 2014-06-07 NOTE — ED Provider Notes (Addendum)
CSN: [id number]     Arrival date & time 06/07/14  1348 History   First MD Initiated Contact with Patient 06/07/14 1405     Chief Complaint  Patient presents with  . Flank Pain     (Consider location/radiation/quality/duration/timing/severity/associated sxs/prior Treatment) HPI 38 year old female presents today with right flank pain intermittently for one week. She does have a history of kidney stones but states this is not like previous kidney stones. She states that the pain is increased with food intake. She has had associated nausea and vomiting. States the pain is more severe. She denies fever or chills. She denies any previous abdominal surgery. She states that she is currently menstruating. She is not short of breath, has no chest pain, denies abnormal vaginal discharge or bleeding. Past Medical History  Diagnosis Date  . Hypertension   . Chronic UTI   . Complication of anesthesia   . Asthma   . Kidney stones   . Pyelonephritis, chronic   . Ovarian cyst   . Bacterial vaginosis   . Candidal vaginitis    Past Surgical History  Procedure Laterality Date  . Lithotripsy  2000's  . Cystoscopy  2001-2012    "total of 11; for chronic chronic pyelonephritis"  . Tubal ligation  2002  . Nephrostomy     Family History  Problem Relation Age of Onset  . HIV/AIDS Mother   . Hypertension Father    History  Substance Use Topics  . Smoking status: Current Every Day Smoker -- 0.25 packs/day for 18 years    Types: Cigarettes  . Smokeless tobacco: Never Used  . Alcohol Use: No   OB History   Grav Para Term Preterm Abortions TAB SAB Ect Mult Living                 Review of Systems  All other systems reviewed and are negative.     Allergies  Flagyl; Ketorolac tromethamine; Metoclopramide hcl; Percocet; and Zofran  Home Medications   Prior to Admission medications   Medication Sig Start Date End Date Taking? Authorizing Provider  albuterol (PROVENTIL HFA;VENTOLIN HFA) 108  (90 BASE) MCG/ACT inhaler Inhale 2 puffs into the lungs every 6 (six) hours as needed for wheezing. For wheezing    Historical Provider, MD  ciprofloxacin (CIPRO) 500 MG tablet Take 1 tablet (500 mg total) by mouth 2 (two) times daily. 05/09/14   Gwenlyn FoundJessica C Copland, MD  hydrochlorothiazide (HYDRODIURIL) 12.5 MG tablet Take 1 tablet (12.5 mg total) by mouth daily. 02/16/14   Gwenlyn FoundJessica C Copland, MD  metoprolol succinate (TOPROL-XL) 25 MG 24 hr tablet Take 1 tablet (25 mg total) by mouth daily. 02/16/14   Gwenlyn FoundJessica C Copland, MD  zolpidem (AMBIEN) 5 MG tablet Take 1 tablet (5 mg total) by mouth at bedtime as needed for sleep. 04/11/14   Gwenlyn FoundJessica C Copland, MD   BP 168/118  Pulse 82  Temp(Src) 98.5 F (36.9 C) (Oral)  Resp 18  Ht 5' 4.5" (1.638 m)  Wt 244 lb 3.2 oz (110.768 kg)  BMI 41.28 kg/m2  SpO2 98%  LMP 06/06/2014 Physical Exam  Nursing note and vitals reviewed. Constitutional: She is oriented to person, place, and time. She appears well-developed and well-nourished.  HENT:  Head: Normocephalic and atraumatic.  Right Ear: External ear normal.  Left Ear: External ear normal.  Nose: Nose normal.  Mouth/Throat: Oropharynx is clear and moist.  Eyes: Conjunctivae and EOM are normal. Pupils are equal, round, and reactive to light.  Neck: Normal  range of motion. Neck supple.  Cardiovascular: Normal rate, regular rhythm, normal heart sounds and intact distal pulses.   Pulmonary/Chest: Effort normal and breath sounds normal.  Abdominal: Soft. Bowel sounds are normal. There is tenderness.  Tenderness to palpation right upper quadrant  Musculoskeletal: Normal range of motion.  Neurological: She is alert and oriented to person, place, and time. She has normal reflexes.  Skin: Skin is warm and dry.  Psychiatric: She has a normal mood and affect. Her behavior is normal. Judgment and thought content normal.    ED Course  Procedures (including critical care time) Labs Review Labs Reviewed   URINALYSIS, ROUTINE W REFLEX MICROSCOPIC - Abnormal; Notable for the following:    Color, Urine RED (*)    APPearance TURBID (*)    Hgb urine dipstick LARGE (*)    Ketones, ur 15 (*)    Protein, ur 30 (*)    Leukocytes, UA MODERATE (*)    All other components within normal limits  CBC WITH DIFFERENTIAL - Abnormal; Notable for the following:    RBC 3.80 (*)    Hemoglobin 11.9 (*)    HCT 35.7 (*)    All other components within normal limits  COMPREHENSIVE METABOLIC PANEL - Abnormal; Notable for the following:    GFR calc non Af Amer 72 (*)    GFR calc Af Amer 83 (*)    All other components within normal limits  URINE MICROSCOPIC-ADD ON - Abnormal; Notable for the following:    Squamous Epithelial / LPF FEW (*)    Bacteria, UA FEW (*)    All other components within normal limits  PREGNANCY, URINE  LIPASE, BLOOD    Imaging Review US Abdomen Complete  06/07/2014   CLINICAL DATA:  Pain.  Nausea.  Vomiting.  EXAM: ULTRASOUND ABDOMEN COMPLETE  COMPARISON:  CT 04/10/2014.  FINDINGS: Gallbladder:  No gallstones or wall thickening visualized. No sonographic Murphy sign noted.  Common bile duct:  Diameter: 2 mm  Liver:  No focal lesion identified. Within normal limits in parenchymal echogenicity.  IVC:  No abnormality visualized.  Pancreas:  Visualized portion unremarkable.  Spleen:  Size and appearance within normal limits.  Right Kidney:  Length: 9.7 cm. Cortical thinning consistent with atrophy. Again noted are dystrophic cortical calcification versus nephrolithiasis. Mild caliectasis versus parapelvic cysts. No prominent hydronephrosis. No mass lesion.  Left Kidney:  Length: 11 cm. Echogenicity within normal limits. No mass or hydronephrosis visualized.  Abdominal aorta:  No aneurysm visualized.  Other findings:  None.  IMPRESSION: 1. Right renal atrophy. Right renal dystrophic cortical calcification versus nephrolithiasis. Mild caliectasis versus parapelvic cyst. No prior hydronephrosis.  Similar findings noted on CT of 04/10/2014. 2. Exam otherwise unremarkable.   Electronically Signed   By: Maisie Fus  Register   On: 06/07/2014 16:01     EKG Interpretation None      MDM   Final diagnoses:  Right upper quadrant pain        Hilario Quarry, MD 06/07/14 1605  Hilario Quarry, MD 06/07/14 [phone]

## 2014-06-29 ENCOUNTER — Emergency Department (HOSPITAL_COMMUNITY): Payer: 59

## 2014-06-29 ENCOUNTER — Ambulatory Visit (INDEPENDENT_AMBULATORY_CARE_PROVIDER_SITE_OTHER): Payer: 59 | Admitting: Family Medicine

## 2014-06-29 ENCOUNTER — Emergency Department (HOSPITAL_COMMUNITY)
Admission: EM | Admit: 2014-06-29 | Discharge: 2014-06-29 | Disposition: A | Discharge status: Home / Self Care | Payer: 59 | Attending: Emergency Medicine | Admitting: Emergency Medicine

## 2014-06-29 ENCOUNTER — Encounter (HOSPITAL_COMMUNITY): Payer: Self-pay | Admitting: Emergency Medicine

## 2014-06-29 VITALS — BP 110/80 | HR 80 | Temp 98.3°F | Resp 16 | Ht 64.75 in | Wt 243.0 lb

## 2014-06-29 DIAGNOSIS — Z8742 Personal history of other diseases of the female genital tract: Secondary | ICD-10-CM | POA: Insufficient documentation

## 2014-06-29 DIAGNOSIS — Z8619 Personal history of other infectious and parasitic diseases: Secondary | ICD-10-CM | POA: Diagnosis not present

## 2014-06-29 DIAGNOSIS — R079 Chest pain, unspecified: Secondary | ICD-10-CM

## 2014-06-29 DIAGNOSIS — Z87442 Personal history of urinary calculi: Secondary | ICD-10-CM | POA: Insufficient documentation

## 2014-06-29 DIAGNOSIS — R112 Nausea with vomiting, unspecified: Secondary | ICD-10-CM | POA: Insufficient documentation

## 2014-06-29 DIAGNOSIS — Z79899 Other long term (current) drug therapy: Secondary | ICD-10-CM | POA: Diagnosis not present

## 2014-06-29 DIAGNOSIS — Z7982 Long term (current) use of aspirin: Secondary | ICD-10-CM | POA: Diagnosis not present

## 2014-06-29 DIAGNOSIS — Z87440 Personal history of urinary (tract) infections: Secondary | ICD-10-CM | POA: Insufficient documentation

## 2014-06-29 DIAGNOSIS — Z3202 Encounter for pregnancy test, result negative: Secondary | ICD-10-CM | POA: Insufficient documentation

## 2014-06-29 DIAGNOSIS — J45909 Unspecified asthma, uncomplicated: Secondary | ICD-10-CM | POA: Diagnosis not present

## 2014-06-29 DIAGNOSIS — I10 Essential (primary) hypertension: Secondary | ICD-10-CM | POA: Diagnosis not present

## 2014-06-29 DIAGNOSIS — F17200 Nicotine dependence, unspecified, uncomplicated: Secondary | ICD-10-CM | POA: Insufficient documentation

## 2014-06-29 LAB — CBC WITH DIFFERENTIAL/PLATELET
BASOS ABS: 0 10*3/uL (ref 0.0–0.1)
BASOS PCT: 1 % (ref 0–1)
EOS ABS: 0.1 10*3/uL (ref 0.0–0.7)
EOS PCT: 1 % (ref 0–5)
HEMATOCRIT: 39.2 % (ref 36.0–46.0)
Hemoglobin: 13.1 g/dL (ref 12.0–15.0)
LYMPHS PCT: 44 % (ref 12–46)
Lymphs Abs: 2.3 10*3/uL (ref 0.7–4.0)
MCH: 30.9 pg (ref 26.0–34.0)
MCHC: 33.4 g/dL (ref 30.0–36.0)
MCV: 92.5 fL (ref 78.0–100.0)
MONO ABS: 0.3 10*3/uL (ref 0.1–1.0)
Monocytes Relative: 5 % (ref 3–12)
Neutro Abs: 2.6 10*3/uL (ref 1.7–7.7)
Neutrophils Relative %: 49 % (ref 43–77)
Platelets: 273 10*3/uL (ref 150–400)
RBC: 4.24 MIL/uL (ref 3.87–5.11)
RDW: 13.3 % (ref 11.5–15.5)
WBC: 5.2 10*3/uL (ref 4.0–10.5)

## 2014-06-29 LAB — BASIC METABOLIC PANEL
ANION GAP: 15 (ref 5–15)
BUN: 8 mg/dL (ref 6–23)
CALCIUM: 9.4 mg/dL (ref 8.4–10.5)
CO2: 20 mEq/L (ref 19–32)
CREATININE: 0.85 mg/dL (ref 0.50–1.10)
Chloride: 102 mEq/L (ref 96–112)
GFR calc Af Amer: >90 mL/min (ref >90)
GFR, EST NON AFRICAN AMERICAN: 87 mL/min — AB (ref >90)
Glucose, Bld: 78 mg/dL (ref 70–99)
Potassium: 4.3 mEq/L (ref 3.7–5.3)
Sodium: 137 mEq/L (ref 137–147)

## 2014-06-29 LAB — PREGNANCY, URINE: Preg Test, Ur: NEGATIVE

## 2014-06-29 LAB — I-STAT TROPONIN, ED: Troponin i, poc: 0 ng/mL (ref 0.00–0.08)

## 2014-06-29 MED ORDER — PROMETHAZINE HCL 12.5 MG PO TABS: 12.5000 mg | ORAL_TABLET | Freq: Four times a day (QID) | ORAL | Status: DC | PRN

## 2014-06-29 MED ORDER — GI COCKTAIL ~~LOC~~: 30.0000 mL | Freq: Once | ORAL | Status: DC

## 2014-06-29 MED ORDER — RANITIDINE HCL 150 MG PO CAPS: 150.0000 mg | ORAL_CAPSULE | Freq: Every day | ORAL | Status: DC

## 2014-06-29 MED ORDER — RANITIDINE HCL 15 MG/ML PO SYRP
75.0000 mg | ORAL_SOLUTION | Freq: Once | ORAL | Status: AC
  Administered 2014-06-29: 75 mg via ORAL
  Filled 2014-06-29: qty 5

## 2014-06-29 MED ORDER — PROMETHAZINE HCL 25 MG/ML IJ SOLN
25.0000 mg | Freq: Once | INTRAMUSCULAR | Status: AC
  Administered 2014-06-29: 25 mg via INTRAMUSCULAR
  Filled 2014-06-29: qty 1

## 2014-06-29 NOTE — ED Provider Notes (Signed)
Patient signed out to me by Joycie Peek, PA-C with plan to followup on labs, radiographs and discharge if results are within normal limits.  [name] is a 38 year old female who presented to the ER today by EMS with complaint of  "heartburn", which radiated into her epigastrium with nausea and vomiting. Workup for rule out of ACS. Labs and EKG, chest x-ray unremarkable for any acute pathology. Plan is to discharge patient if labs return unremarkable.   On reassessment of patient, patient states she's feeling better at this time. Patient states she feels like this was most likely just her GERD.  Chest radiographs returned with impression of no active cardiopulmonary disease, BMP unremarkable. We will discharge patient at this time. I encouraged patient to follow up with her primary care physician, and return to the ER should her symptoms change, worsen or should she have any questions or concerns.   BP 158/110  Pulse 60  Temp(Src) 98.2 F (36.8 C) (Oral)  Resp 16  SpO2 100%  LMP 06/06/2014  Signed,  Nancy Mow, PA-C 2:52 AM      Nancy Fantasia, PA-C 06/30/14 [phone]

## 2014-06-29 NOTE — ED Notes (Signed)
Pt came by EMS from [facility]. Pt A&OX4, NAD noted. Per notes has history of asthma and HTN, on albuterol and HCTZ, toprol XL. Pt notes sharp pains under her right beast. Pt noted this first last night while she was in bed. She also noted that her "heart was racing" last night. She got up and took her BP medication (around 1am, she realized she had forgotten it yesterday). However the pain did not resolve. The pains may last 1-2 minutes. They occur about 2x an hour. She does feel nauseated. No vomiting. VSS: BP 118/70, P56, R18, 96% rm air, CBG 84.

## 2014-06-29 NOTE — Discharge Instructions (Signed)

## 2014-06-29 NOTE — Progress Notes (Signed)
[facility] [address] [place] [address] [phone]  Date:  06/29/2014   Name:  [name]   DOB:  [birth year]   MRN:  [id number]  PCP:  Doreen Salvage, MD    Chief Complaint: Chest Pain and Nausea   History of Present Illness:  [name] is a 38 y.o. very pleasant female patient who presents with the following:  History of asthma and HTN, on albuterol and HCTZ, toprol XL.   She notes sharp pains under her right beast.  She noted this first last night while she was in bed.  She also noted that her "heart was racing" last night.  She got up and took her BP medication (around 1am, she realized she had forgotten it yesterday).  However the pain did not resolve.  The pains may last 1-2 minutes.  They occur about 2x an hour.  She does feel nauseated.  No vomiting.  She has never had CP in the past.  She has not noted any wheezing or SOB.   She does not have any history of CAD.   There is a family history of heart problems on her dad's side.  Her PGM had some sort of heart surgery.  Her father also has heart problems, her mother is deceased.  She is a smoker.   LMP 8/19  Patient Active Problem List   Diagnosis Date Noted  . Vomiting 04/21/2012  . Pyelonephritis 04/20/2012  . HTN (hypertension) 04/20/2012  . Asthma 04/20/2012    Past Medical History  Diagnosis Date  . Hypertension   . Chronic UTI   . Complication of anesthesia   . Asthma   . Kidney stones   . Pyelonephritis, chronic   . Ovarian cyst   . Bacterial vaginosis   . Candidal vaginitis     Past Surgical History  Procedure Laterality Date  . Lithotripsy  2000's  . Cystoscopy  2001-2012    "total of 11; for chronic chronic pyelonephritis"  . Tubal ligation  2002  . Nephrostomy      History  Substance Use Topics  . Smoking status: Current Every Day Smoker -- 0.25 packs/day for 18 years    Types: Cigarettes  . Smokeless tobacco: Never Used  . Alcohol Use: No     Family History  Problem Relation Age of Onset  . HIV/AIDS Mother   . Hypertension Father     Allergies  Allergen Reactions  . Flagyl [Metronidazole Hcl] Hives  . Ketorolac Tromethamine Hives  . Metoclopramide Hcl Other (See Comments)    Facial spasm; "they thought I was having a stroke"  . Percocet [Oxycodone-Acetaminophen] Hives and Itching  . Zofran [Ondansetron Hcl] Hives    Medication list has been reviewed and updated.  Current Outpatient Prescriptions on File Prior to Visit  Medication Sig Dispense Refill  . albuterol (PROVENTIL HFA;VENTOLIN HFA) 108 (90 BASE) MCG/ACT inhaler Inhale 2 puffs into the lungs every 6 (six) hours as needed for wheezing. For wheezing      . hydrochlorothiazide (HYDRODIURIL) 12.5 MG tablet Take 1 tablet (12.5 mg total) by mouth daily.  30 tablet  5  . metoprolol succinate (TOPROL-XL) 25 MG 24 hr tablet Take 1 tablet (25 mg total) by mouth daily.  30 tablet  5  . ciprofloxacin (CIPRO) 500 MG tablet Take 1 tablet (500 mg total) by mouth 2 (two) times daily.  20 tablet  0  . ondansetron (ZOFRAN ODT) 8 MG disintegrating tablet  Take 1 tablet (8 mg total) by mouth every 8 (eight) hours as needed for nausea or vomiting.  20 tablet  0  . oxyCODONE-acetaminophen (PERCOCET/ROXICET) 5-325 MG per tablet Take 1 tablet by mouth every 4 (four) hours as needed for severe pain.  15 tablet  0  . promethazine (PHENERGAN) 25 MG tablet Take 1 tablet (25 mg total) by mouth every 6 (six) hours as needed for nausea or vomiting.  10 tablet  0  . zolpidem (AMBIEN) 5 MG tablet Take 1 tablet (5 mg total) by mouth at bedtime as needed for sleep.  30 tablet  0   No current facility-administered medications on file prior to visit.    Review of Systems:  As per HPI- otherwise negative.   Physical Examination: Filed Vitals:   06/29/14 1234  BP: 100/80  Pulse: 80  Temp: 98.3 F (36.8 C)  Resp: 16   Filed Vitals:   06/29/14 1234  Height: 5' 4.75" (1.645 m)   Weight: 243 lb (110.224 kg)   Body mass index is 40.73 kg/(m^2). Ideal Body Weight: Weight in (lb) to have BMI = 25: 148.8  GEN: WDWN, NAD, Non-toxic, A & O x 3 HEENT: Atraumatic, Normocephalic. Neck supple. No masses, No LAD. Ears and Nose: No external deformity. CV: RRR, No M/G/R. No JVD. No thrill. No extra heart sounds. PULM: CTA B, no wheezes, crackles, rhonchi. No retractions. No resp. distress. No accessory muscle use. ABD: S, NT, ND, +BS. No rebound. No HSM. EXTR: No c/c/e NEURO Normal gait.  PSYCH: Normally interactive. Conversant. Not depressed or anxious appearing.  Calm demeanor.    EKG: sinus bradycardia  GI cocktail: (called and checked with pharmD that there is no significant cross reactivity between zofran and viscous lidocaine).  Given GI cocktail but she then vomited it up, and did not notice any relief of her CP    Given asa  #4 at 1:34 pm Called EMS for transport to the ED.   Assessment and Plan: Chest pain, unspecified chest pain type - Plan: EKG 12-Lead, gi cocktail (Maalox,Lidocaine,Donnatal)  Chest pain since around 0100 today, intermittent.  She has a history of HTN ,obestiy, tobacco smoking.  Given aspirin, did not give nitro as she does not have IV access (she generally requires access in her chest wall or foot according to her report).   Will transfer to the ER for CP rule- out.  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    Signed Abbe Amsterdam, MD

## 2014-06-29 NOTE — ED Notes (Signed)
Mintz, PA at bedside. 

## 2014-06-29 NOTE — ED Provider Notes (Signed)
CSN: [id number]     Arrival date & time 06/29/14  1449 History   First MD Initiated Contact with Patient 06/29/14 1514     Chief Complaint  Patient presents with  . Chest Pain     (Consider location/radiation/quality/duration/timing/severity/associated sxs/prior Treatment) HPI [name] is a 38 y.o. female history of asthma, hypertension who comes in for evaluation of chest pain.She states this pain started this morning at 1 AM she woke up and had substernal sharp pains under her right breast. She said the discomfort lasted for 1-2 minutes and then resolved she reports having these events 3 or 4 times an hour since 1 AM this morning. Nothing makes the pain worse or better. She also reports feeling nauseous and vomiting 3 times. No blood in her vomit. 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 She tried taking 4 TUMS but she threw them up. She was advised to come to the ED by one of the providers at her work to be evaluated for ACS. She reports she does not feel like her chest hurts as much as she feels like this is GERD or reflux. She denies fevers, shortness of breath, numbness or weakness, abdominal pain she currently smokes 4 cigarettes a day. Denies alcohol or illicit substance use  Past Medical History  Diagnosis Date  . Hypertension   . Chronic UTI   . Complication of anesthesia   . Asthma   . Kidney stones   . Pyelonephritis, chronic   . Ovarian cyst   . Bacterial vaginosis   . Candidal vaginitis    Past Surgical History  Procedure Laterality Date  . Lithotripsy  2000's  . Cystoscopy  2001-2012    "total of 11; for chronic chronic pyelonephritis"  . Tubal ligation  2002  . Nephrostomy     Family History  Problem Relation Age of Onset  . HIV/AIDS Mother   . Hypertension Father    History  Substance Use Topics  . Smoking status: Current Every Day Smoker -- 0.25 packs/day for 18 years    Types: Cigarettes  . Smokeless  tobacco: Never Used  . Alcohol Use: No   OB History   Grav Para Term Preterm Abortions TAB SAB Ect Mult Living                 Review of Systems  Constitutional: Negative for fever.  HENT: Negative for congestion.   Respiratory: Negative for shortness of breath.   Cardiovascular: Positive for chest pain.  Gastrointestinal: Positive for nausea and vomiting. Negative for diarrhea and constipation.  Endocrine: Negative for polyuria.  Genitourinary: Negative for dysuria.  Musculoskeletal: Negative for back pain.  Skin: Negative for rash.  Neurological: Negative for headaches.      Allergies  Flagyl; Ketorolac tromethamine; Metoclopramide hcl; Percocet; and Zofran  Home Medications   Prior to Admission medications   Medication Sig Start Date End Date Taking? Authorizing Provider  albuterol (PROVENTIL HFA;VENTOLIN HFA) 108 (90 BASE) MCG/ACT inhaler Inhale 2 puffs into the lungs every 6 (six) hours as needed for wheezing. For wheezing   Yes Historical Provider, MD  hydrochlorothiazide (HYDRODIURIL) 12.5 MG tablet Take 1 tablet (12.5 mg total) by mouth daily. 02/16/14  Yes Gwenlyn Found Copland, MD  metoprolol succinate (TOPROL-XL) 25 MG 24 hr tablet Take 1 tablet (25 mg total) by mouth daily. 02/16/14  Yes Gwenlyn Found Copland, MD  aspirin EC 81 MG tablet Take 324 mg by mouth once.  Historical Provider, MD  promethazine (PHENERGAN) 12.5 MG tablet Take 1 tablet (12.5 mg total) by mouth every 6 (six) hours as needed for nausea or vomiting. 06/29/14   Earle Gell Addalyn Speedy, PA-C  ranitidine (ZANTAC) 150 MG capsule Take 1 capsule (150 mg total) by mouth daily. 06/29/14   Earle Gell Charnay Nazario, PA-C   BP 158/110  Pulse 60  Temp(Src) 98.2 F (36.8 C) (Oral)  Resp 16  SpO2 100%  LMP 06/06/2014 Physical Exam  Nursing note and vitals reviewed. Constitutional: She is oriented to person, place, and time. She appears well-developed and well-nourished.  Awake, alert, nontoxic appearance.  HENT:  Head:  Normocephalic and atraumatic.  Mouth/Throat: Oropharynx is clear and moist.  Eyes: Conjunctivae are normal. Pupils are equal, round, and reactive to light. Right eye exhibits no discharge. Left eye exhibits no discharge. No scleral icterus.  Neck: Neck supple.  Cardiovascular: Normal rate, regular rhythm and normal heart sounds.   Pulmonary/Chest: Effort normal and breath sounds normal. No respiratory distress. She has no wheezes. She has no rales. She exhibits no tenderness.  Abdominal: Soft. There is no tenderness. There is no rebound.  Musculoskeletal: She exhibits no tenderness.  Baseline ROM, no obvious new focal weakness.  Neurological: She is alert and oriented to person, place, and time.  Mental status and motor strength appears baseline for patient and situation.  Skin: Skin is warm and dry. No rash noted.  Psychiatric: She has a normal mood and affect.    ED Course  Procedures (including critical care time) Labs Review Labs Reviewed  BASIC METABOLIC PANEL - Abnormal; Notable for the following:    GFR calc non Af Amer 87 (*)    All other components within normal limits  CBC WITH DIFFERENTIAL  PREGNANCY, URINE  I-STAT TROPOININ, ED    Imaging Review Dg Chest 2 View  06/29/2014   CLINICAL DATA:  Chest pain.  EXAM: CHEST  2 VIEW  COMPARISON:  None.  FINDINGS: The cardiomediastinal silhouette is unremarkable.  There is no evidence of focal airspace disease, pulmonary edema, suspicious pulmonary nodule/mass, pleural effusion, or pneumothorax. No acute bony abnormalities are identified.  IMPRESSION: No active cardiopulmonary disease.   Electronically Signed   By: Laveda Abbe M.D.   On: 06/29/2014 19:21     EKG Interpretation   Date/Time:  Friday June 29 2014 14:56:17 EDT Ventricular Rate:  52 PR Interval:  160 QRS Duration: 81 QT Interval:  416 QTC Calculation: 387 R Axis:   82 Text Interpretation:  Sinus rhythm No significant change since last  tracing Confirmed by  YAO  MD, DAVID ([id number]) on 06/29/2014 3:42:44 PM     Meds given in ED:  Medications  promethazine (PHENERGAN) injection 25 mg (25 mg Intramuscular Given 06/29/14 1644)  ranitidine (ZANTAC) 15 MG/ML syrup 75 mg (75 mg Oral Given 06/29/14 1644)    Discharge Medication List as of 06/29/2014  6:40 PM    START taking these medications   Details  promethazine (PHENERGAN) 12.5 MG tablet Take 1 tablet (12.5 mg total) by mouth every 6 (six) hours as needed for nausea or vomiting., Starting 06/29/2014, Until Discontinued, Print    ranitidine (ZANTAC) 150 MG capsule Take 1 capsule (150 mg total) by mouth daily., Starting 06/29/2014, Until Discontinued, Print       Filed Vitals:   06/29/14 2011  BP: 158/110  Pulse: 60  Temp: 98.2 F (36.8 C)  Resp: 16    MDM  Vitals stable - WNL -afebrile Pt resting  comfortably in ED.  PE and clinical picture not concerning for ACS or other acute emergent pathology. Troponin negative, EKG not concerning.  Pending result of blood work and CXR.  Patient reports not tolerating Zofran, Reglan, Toradol, Percocet. Will administer IM Phenergan to treat nausea and give Zantac trial by mouth to alleviate her symptoms. States she feels much better after Phenergan treatment I do not believe this is cardiopulmonary related. If Phenergan and Zantac alleviate symptoms I feel confident this is GI related discomfort and she can be discharged with anti-emetics and acid reducers and can followup with primary care. Care transferred to [facility] meds at this time 6:26pm  Final diagnoses:  Chest pain, unspecified chest pain type  Prior to patient discharge, I discussed and reviewed this case with Dr.Yao and Jinny Sanders, PA-C.         Earle Gell Denmark, PA-C 06/30/14 [phone]

## 2014-06-29 NOTE — ED Notes (Signed)
Cartner PA at bedside 

## 2014-07-01 NOTE — ED Provider Notes (Signed)
Medical screening examination/treatment/procedure(s) were performed by non-physician practitioner and as supervising physician I was immediately available for consultation/collaboration.   EKG Interpretation   Date/Time:  Friday June 29 2014 14:56:17 EDT Ventricular Rate:  52 PR Interval:  160 QRS Duration: 81 QT Interval:  416 QTC Calculation: 387 R Axis:   82 Text Interpretation:  Sinus rhythm No significant change since last  tracing Confirmed by YAO  MD, DAVID ([id number]) on 06/29/2014 3:42:44 PM        Richardean Canal, MD 07/01/14 1920

## 2014-10-14 ENCOUNTER — Encounter (HOSPITAL_BASED_OUTPATIENT_CLINIC_OR_DEPARTMENT_OTHER): Payer: Self-pay | Admitting: *Deleted

## 2014-10-14 ENCOUNTER — Emergency Department (HOSPITAL_BASED_OUTPATIENT_CLINIC_OR_DEPARTMENT_OTHER)
Admission: EM | Admit: 2014-10-14 | Discharge: 2014-10-14 | Disposition: A | Discharge status: Home / Self Care | Payer: 59 | Attending: Emergency Medicine | Admitting: Emergency Medicine

## 2014-10-14 DIAGNOSIS — Z8619 Personal history of other infectious and parasitic diseases: Secondary | ICD-10-CM | POA: Insufficient documentation

## 2014-10-14 DIAGNOSIS — Z8742 Personal history of other diseases of the female genital tract: Secondary | ICD-10-CM | POA: Insufficient documentation

## 2014-10-14 DIAGNOSIS — Z79899 Other long term (current) drug therapy: Secondary | ICD-10-CM | POA: Insufficient documentation

## 2014-10-14 DIAGNOSIS — Z87442 Personal history of urinary calculi: Secondary | ICD-10-CM | POA: Insufficient documentation

## 2014-10-14 DIAGNOSIS — I10 Essential (primary) hypertension: Secondary | ICD-10-CM | POA: Insufficient documentation

## 2014-10-14 DIAGNOSIS — Z3202 Encounter for pregnancy test, result negative: Secondary | ICD-10-CM | POA: Insufficient documentation

## 2014-10-14 DIAGNOSIS — Z7982 Long term (current) use of aspirin: Secondary | ICD-10-CM | POA: Insufficient documentation

## 2014-10-14 DIAGNOSIS — G43909 Migraine, unspecified, not intractable, without status migrainosus: Secondary | ICD-10-CM | POA: Insufficient documentation

## 2014-10-14 DIAGNOSIS — J45909 Unspecified asthma, uncomplicated: Secondary | ICD-10-CM | POA: Insufficient documentation

## 2014-10-14 DIAGNOSIS — Z87440 Personal history of urinary (tract) infections: Secondary | ICD-10-CM | POA: Insufficient documentation

## 2014-10-14 DIAGNOSIS — G43009 Migraine without aura, not intractable, without status migrainosus: Secondary | ICD-10-CM

## 2014-10-14 DIAGNOSIS — Z720 Tobacco use: Secondary | ICD-10-CM | POA: Insufficient documentation

## 2014-10-14 LAB — URINALYSIS, ROUTINE W REFLEX MICROSCOPIC
BILIRUBIN URINE: NEGATIVE
Glucose, UA: NEGATIVE mg/dL
Hgb urine dipstick: NEGATIVE
Ketones, ur: NEGATIVE mg/dL
NITRITE: NEGATIVE
PH: 6.5 (ref 5.0–8.0)
Protein, ur: NEGATIVE mg/dL
SPECIFIC GRAVITY, URINE: 1.02 (ref 1.005–1.030)
Urobilinogen, UA: 0.2 mg/dL (ref 0.0–1.0)

## 2014-10-14 LAB — URINE MICROSCOPIC-ADD ON

## 2014-10-14 LAB — PREGNANCY, URINE: PREG TEST UR: NEGATIVE

## 2014-10-14 MED ORDER — PROCHLORPERAZINE MALEATE 10 MG PO TABS
ORAL_TABLET | ORAL | Status: AC
  Administered 2014-10-14: 10 mg via ORAL
  Filled 2014-10-14: qty 1

## 2014-10-14 MED ORDER — DEXAMETHASONE 4 MG PO TABS
12.0000 mg | ORAL_TABLET | Freq: Once | ORAL | Status: AC
  Administered 2014-10-14: 12 mg via ORAL
  Filled 2014-10-14: qty 3

## 2014-10-14 MED ORDER — DEXAMETHASONE SODIUM PHOSPHATE 10 MG/ML IJ SOLN: 10.0000 mg | Freq: Once | INTRAMUSCULAR | Status: DC

## 2014-10-14 MED ORDER — PROCHLORPERAZINE MALEATE 10 MG PO TABS
10.0000 mg | ORAL_TABLET | Freq: Once | ORAL | Status: AC
  Administered 2014-10-14: 10 mg via ORAL

## 2014-10-14 MED ORDER — DIPHENHYDRAMINE HCL 50 MG/ML IJ SOLN: 25.0000 mg | Freq: Once | INTRAMUSCULAR | Status: DC

## 2014-10-14 MED ORDER — PROCHLORPERAZINE EDISYLATE 5 MG/ML IJ SOLN
10.0000 mg | Freq: Once | INTRAMUSCULAR | Status: DC
  Filled 2014-10-14: qty 2

## 2014-10-14 MED ORDER — DIPHENHYDRAMINE HCL 25 MG PO CAPS
25.0000 mg | ORAL_CAPSULE | Freq: Once | ORAL | Status: AC
  Administered 2014-10-14: 25 mg via ORAL
  Filled 2014-10-14: qty 1

## 2014-10-14 MED ORDER — SODIUM CHLORIDE 0.9 % IV BOLUS (SEPSIS): 1000.0000 mL | Freq: Once | INTRAVENOUS | Status: DC

## 2014-10-14 MED ORDER — METOPROLOL SUCCINATE ER 25 MG PO TB24: 25.0000 mg | ORAL_TABLET | Freq: Every day | ORAL | Status: DC

## 2014-10-14 MED ORDER — HYDROCHLOROTHIAZIDE 12.5 MG PO TABS: 12.5000 mg | ORAL_TABLET | Freq: Every day | ORAL | Status: DC

## 2014-10-14 NOTE — ED Notes (Signed)
Dr. Littie DeedsGentry notified that we've been unsuccessful in obtaining IV access after 4 attempts by 2 different personnel. Plan of care is to modify orders Danna HeftyGolden, Marybell Robards Lee, RN

## 2014-10-14 NOTE — ED Provider Notes (Signed)
CSN: [id number]     Arrival date & time 10/14/14  1809 History  This chart was scribed for Mirian MoMatthew Larry Alcock, MD by Tonye RoyaltyJoshua Chen, ED Scribe. This patient was seen in room MHOTF/OTF and the patient's care was started at 6:40 PM.    Chief Complaint  Patient presents with  . Headache   Patient is a 38 y.o. female presenting with headaches. The history is provided by the patient. No language interpreter was used.  Headache Pain location:  Generalized Quality:  Unable to specify Radiates to:  Does not radiate Onset quality:  Sudden Timing:  Intermittent Progression:  Unchanged Chronicity:  Recurrent Similar to prior headaches: yes   Context comment:  Unmedicated hypertension Relieved by: sleep. Worsened by:  Nothing tried Ineffective treatments:  None tried Associated symptoms: no fever, no nausea and no vomiting   Risk factors comment:  Hx of hypertension   HPI Comments: [name] is a 38 y.o. female who presents to the Emergency Department complaining of intermittent mild headache with onset 4 days ago. She states nothing makes her headache better or worse. She notes she has had tingling in her hand but thinks it was because she was sitting on it. She states she ran out of blood pressure medication 1.5 weeks ago and believes it is due to this. She states this headache feels unlike prior migraines with which she usually has vomiting. She states she was going to [facility] [place] but no longer has insurance. She states she was last prescribed Metoprolol and HCTZ. She denies fever, nausea, vomiting, or visual disturbance.  Past Medical History  Diagnosis Date  . Hypertension   . Chronic UTI   . Complication of anesthesia   . Asthma   . Kidney stones   . Pyelonephritis, chronic   . Ovarian cyst   . Bacterial vaginosis   . Candidal vaginitis    Past Surgical History  Procedure Laterality Date  . Lithotripsy  2000's  . Cystoscopy  2001-2012    "total  of 11; for chronic chronic pyelonephritis"  . Tubal ligation  2002  . Nephrostomy     Family History  Problem Relation Age of Onset  . HIV/AIDS Mother   . Hypertension Father    History  Substance Use Topics  . Smoking status: Current Every Day Smoker -- 0.25 packs/day for 18 years    Types: Cigarettes  . Smokeless tobacco: Never Used  . Alcohol Use: No   OB History    No data available     Review of Systems  Constitutional: Negative for fever.  Eyes: Negative for visual disturbance.  Gastrointestinal: Negative for nausea and vomiting.  Neurological: Positive for headaches.       Positive tingling  All other systems reviewed and are negative.     Allergies  Flagyl; Ketorolac tromethamine; Metoclopramide hcl; Percocet; and Zofran  Home Medications   Prior to Admission medications   Medication Sig Start Date End Date Taking? Authorizing Provider  albuterol (PROVENTIL HFA;VENTOLIN HFA) 108 (90 BASE) MCG/ACT inhaler Inhale 2 puffs into the lungs every 6 (six) hours as needed for wheezing. For wheezing    Historical Provider, MD  aspirin EC 81 MG tablet Take 324 mg by mouth once.    Historical Provider, MD  hydrochlorothiazide (HYDRODIURIL) 12.5 MG tablet Take 1 tablet (12.5 mg total) by mouth daily. 10/14/14   Mirian MoMatthew Faithlynn Deeley, MD  metoprolol succinate (TOPROL-XL) 25 MG 24 hr tablet Take 1 tablet (25 mg  total) by mouth daily. 10/14/14   Mirian MoMatthew Kerriann Kamphuis, MD  promethazine (PHENERGAN) 12.5 MG tablet Take 1 tablet (12.5 mg total) by mouth every 6 (six) hours as needed for nausea or vomiting. 06/29/14   Earle GellBenjamin W Cartner, PA-C  ranitidine (ZANTAC) 150 MG capsule Take 1 capsule (150 mg total) by mouth daily. 06/29/14   Earle GellBenjamin W Cartner, PA-C   BP 159/102 mmHg  Pulse 84  Temp(Src) 98.7 F (37.1 C) (Oral)  Resp 16  SpO2 100% Physical Exam  Constitutional: She is oriented to person, place, and time. She appears well-developed and well-nourished.  HENT:  Head: Normocephalic and  atraumatic.  Right Ear: External ear normal.  Left Ear: External ear normal.  Eyes: Conjunctivae and EOM are normal. Pupils are equal, round, and reactive to light.  Neck: Normal range of motion. Neck supple.  Cardiovascular: Normal rate, regular rhythm, normal heart sounds and intact distal pulses.   Pulmonary/Chest: Effort normal and breath sounds normal.  Abdominal: Soft. Bowel sounds are normal. There is no tenderness.  Musculoskeletal: Normal range of motion.  Neurological: She is alert and oriented to person, place, and time. She has normal strength. No cranial nerve deficit or sensory deficit. GCS eye subscore is 4. GCS verbal subscore is 5. GCS motor subscore is 6.  Skin: Skin is warm and dry.  Vitals reviewed.   ED Course  Procedures (including critical care time)  DIAGNOSTIC STUDIES: Oxygen Saturation is 100% on room air, normal by my interpretation.    COORDINATION OF CARE: 6:44 PM Discussed treatment plan with patient at beside, the patient agrees with the plan and has no further questions at this time.   Labs Review Labs Reviewed  URINALYSIS, ROUTINE W REFLEX MICROSCOPIC - Abnormal; Notable for the following:    APPearance CLOUDY (*)    Leukocytes, UA MODERATE (*)    All other components within normal limits  URINE MICROSCOPIC-ADD ON - Abnormal; Notable for the following:    Squamous Epithelial / LPF FEW (*)    Bacteria, UA MANY (*)    All other components within normal limits  PREGNANCY, URINE    Imaging Review No results found.   EKG Interpretation None      MDM   Final diagnoses:  Migraine without aura and without status migrainosus, not intractable  Essential hypertension    38 y.o. female with pertinent PMH of HTN, asthma, nephrolithiasis presents with recurrent headache and hypertension. Patient has a history of the same and is out of her blood pressure medicines for over one week. She states that she tends to get migraines during this time.  Current headache is different from prior migraines that she is not nauseous.  On arrival vital signs and physical exam as above. No focal neurodeficits. Patient was given migraine cocktail orally with relief of symptoms. We'll discharge her with a small course of antihypertensives and have her follow-up with PCP..    I have reviewed all laboratory and imaging studies if ordered as above  1. Migraine without aura and without status migrainosus, not intractable   2. Essential hypertension   3. HTN (hypertension)           Mirian MoMatthew Shakura Cowing, MD 10/14/14 [phone]

## 2014-10-14 NOTE — ED Notes (Signed)
Patient states she has a headache that feels is related to her BP. She has been out of BP medication for appx a week.

## 2014-10-14 NOTE — Discharge Instructions (Signed)
Hypertension °Hypertension, commonly called high blood pressure, is when the force of blood pumping through your arteries is too strong. Your arteries are the blood vessels that carry blood from your heart throughout your body. A blood pressure reading consists of a higher number over a lower number, such as 110/72. The higher number (systolic) is the pressure inside your arteries when your heart pumps. The lower number (diastolic) is the pressure inside your arteries when your heart relaxes. Ideally you want your blood pressure below 120/80. °Hypertension forces your heart to work harder to pump blood. Your arteries may become narrow or stiff. Having hypertension puts you at risk for heart disease, stroke, and other problems.  °RISK FACTORS °Some risk factors for high blood pressure are controllable. Others are not.  °Risk factors you cannot control include:  °· Race. You may be at higher risk if you are African American. °· Age. Risk increases with age. °· Gender. Men are at higher risk than women before age 45 years. After age 65, women are at higher risk than men. °Risk factors you can control include: °· Not getting enough exercise or physical activity. °· Being overweight. °· Getting too much fat, sugar, calories, or salt in your diet. °· Drinking too much alcohol. °SIGNS AND SYMPTOMS °Hypertension does not usually cause signs or symptoms. Extremely high blood pressure (hypertensive crisis) may cause headache, anxiety, shortness of breath, and nosebleed. °DIAGNOSIS  °To check if you have hypertension, your health care provider will measure your blood pressure while you are seated, with your arm held at the level of your heart. It should be measured at least twice using the same arm. Certain conditions can cause a difference in blood pressure between your right and left arms. A blood pressure reading that is higher than normal on one occasion does not mean that you need treatment. If one blood pressure reading  is high, ask your health care provider about having it checked again. °TREATMENT  °Treating high blood pressure includes making lifestyle changes and possibly taking medicine. Living a healthy lifestyle can help lower high blood pressure. You may need to change some of your habits. °Lifestyle changes may include: °· Following the DASH diet. This diet is high in fruits, vegetables, and whole grains. It is low in salt, red meat, and added sugars. °· Getting at least 2½ hours of brisk physical activity every week. °· Losing weight if necessary. °· Not smoking. °· Limiting alcoholic beverages. °· Learning ways to reduce stress. ° If lifestyle changes are not enough to get your blood pressure under control, your health care provider may prescribe medicine. You may need to take more than one. Work closely with your health care provider to understand the risks and benefits. °HOME CARE INSTRUCTIONS °· Have your blood pressure rechecked as directed by your health care provider.   °· Take medicines only as directed by your health care provider. Follow the directions carefully. Blood pressure medicines must be taken as prescribed. The medicine does not work as well when you skip doses. Skipping doses also puts you at risk for problems.   °· Do not smoke.   °· Monitor your blood pressure at home as directed by your health care provider.  °SEEK MEDICAL CARE IF:  °· You think you are having a reaction to medicines taken. °· You have recurrent headaches or feel dizzy. °· You have swelling in your ankles. °· You have trouble with your vision. °SEEK IMMEDIATE MEDICAL CARE IF: °· You develop a severe headache or confusion. °·   You have unusual weakness, numbness, or feel faint. °· You have severe chest or abdominal pain. °· You vomit repeatedly. °· You have trouble breathing. °MAKE SURE YOU:  °· Understand these instructions. °· Will watch your condition. °· Will get help right away if you are not doing well or get worse. °Document  Released: 10/05/2005 Document Revised: 02/19/2014 Document Reviewed: 07/28/2013 °Exit[employer]® Patient Information ©2015 [employer]. This information is not intended to replace advice given to you by your health care provider. Make sure you discuss any questions you have with your health care provider. °Migraine Headache °A migraine headache is an intense, throbbing pain on one or both sides of your head. A migraine can last for 30 minutes to several hours. °CAUSES  °The exact cause of a migraine headache is not always known. However, a migraine may be caused when nerves in the brain become irritated and release chemicals that cause inflammation. This causes pain. °Certain things may also trigger migraines, such as: °· Alcohol. °· Smoking. °· Stress. °· Menstruation. °· Aged cheeses. °· Foods or drinks that contain nitrates, glutamate, aspartame, or tyramine. °· Lack of sleep. °· Chocolate. °· Caffeine. °· Hunger. °· Physical exertion. °· Fatigue. °· Medicines used to treat chest pain (nitroglycerine), birth control pills, estrogen, and some blood pressure medicines. °SIGNS AND SYMPTOMS °· Pain on one or both sides of your head. °· Pulsating or throbbing pain. °· Severe pain that prevents daily activities. °· Pain that is aggravated by any physical activity. °· Nausea, vomiting, or both. °· Dizziness. °· Pain with exposure to bright lights, loud noises, or activity. °· General sensitivity to bright lights, loud noises, or smells. °Before you get a migraine, you may get warning signs that a migraine is coming (aura). An aura may include: °· Seeing flashing lights. °· Seeing bright spots, halos, or zigzag lines. °· Having tunnel vision or blurred vision. °· Having feelings of numbness or tingling. °· Having trouble talking. °· Having muscle weakness. °DIAGNOSIS  °A migraine headache is often diagnosed based on: °· Symptoms. °· Physical exam. °· A CT scan or MRI of your head. These imaging tests cannot diagnose  migraines, but they can help rule out other causes of headaches. °TREATMENT °Medicines may be given for pain and nausea. Medicines can also be given to help prevent recurrent migraines.  °HOME CARE INSTRUCTIONS °· Only take over-the-counter or prescription medicines for pain or discomfort as directed by your health care provider. The use of long-term narcotics is not recommended. °· Lie down in a dark, quiet room when you have a migraine. °· Keep a journal to find out what may trigger your migraine headaches. For example, write down: °¨ What you eat and drink. °¨ How much sleep you get. °¨ Any change to your diet or medicines. °· Limit alcohol consumption. °· Quit smoking if you smoke. °· Get 7-9 hours of sleep, or as recommended by your health care provider. °· Limit stress. °· Keep lights dim if bright lights bother you and make your migraines worse. °SEEK IMMEDIATE MEDICAL CARE IF:  °· Your migraine becomes severe. °· You have a fever. °· You have a stiff neck. °· You have vision loss. °· You have muscular weakness or loss of muscle control. °· You start losing your balance or have trouble walking. °· You feel faint or pass out. °· You have severe symptoms that are different from your first symptoms. °MAKE SURE YOU:  °· Understand these instructions. °· Will watch your condition. °·   Will get help right away if you are not doing well or get worse. °Document Released: 10/05/2005 Document Revised: 02/19/2014 Document Reviewed: 06/12/2013 °ExitCare® Patient Information ©2015 [employer]. This information is not intended to replace advice given to you by your health care provider. Make sure you discuss any questions you have with your health care provider. ° °

## 2015-07-17 ENCOUNTER — Encounter (HOSPITAL_COMMUNITY): Payer: Self-pay | Admitting: *Deleted

## 2015-07-17 ENCOUNTER — Emergency Department (HOSPITAL_COMMUNITY): Payer: Medicaid Other

## 2015-07-17 ENCOUNTER — Emergency Department (HOSPITAL_COMMUNITY)
Admission: EM | Admit: 2015-07-17 | Discharge: 2015-07-17 | Disposition: A | Discharge status: Home / Self Care | Payer: Medicaid Other | Attending: Emergency Medicine | Admitting: Emergency Medicine

## 2015-07-17 DIAGNOSIS — J45901 Unspecified asthma with (acute) exacerbation: Secondary | ICD-10-CM | POA: Insufficient documentation

## 2015-07-17 DIAGNOSIS — R509 Fever, unspecified: Secondary | ICD-10-CM | POA: Diagnosis not present

## 2015-07-17 DIAGNOSIS — R000 Tachycardia, unspecified: Secondary | ICD-10-CM | POA: Diagnosis not present

## 2015-07-17 DIAGNOSIS — Z79899 Other long term (current) drug therapy: Secondary | ICD-10-CM | POA: Diagnosis not present

## 2015-07-17 DIAGNOSIS — Z720 Tobacco use: Secondary | ICD-10-CM | POA: Diagnosis not present

## 2015-07-17 DIAGNOSIS — Z3202 Encounter for pregnancy test, result negative: Secondary | ICD-10-CM | POA: Insufficient documentation

## 2015-07-17 DIAGNOSIS — M545 Low back pain: Secondary | ICD-10-CM | POA: Diagnosis not present

## 2015-07-17 DIAGNOSIS — Z87442 Personal history of urinary calculi: Secondary | ICD-10-CM | POA: Diagnosis not present

## 2015-07-17 DIAGNOSIS — Z8619 Personal history of other infectious and parasitic diseases: Secondary | ICD-10-CM | POA: Insufficient documentation

## 2015-07-17 DIAGNOSIS — I10 Essential (primary) hypertension: Secondary | ICD-10-CM | POA: Diagnosis not present

## 2015-07-17 DIAGNOSIS — Z8742 Personal history of other diseases of the female genital tract: Secondary | ICD-10-CM | POA: Insufficient documentation

## 2015-07-17 DIAGNOSIS — R21 Rash and other nonspecific skin eruption: Secondary | ICD-10-CM | POA: Insufficient documentation

## 2015-07-17 DIAGNOSIS — Z87448 Personal history of other diseases of urinary system: Secondary | ICD-10-CM | POA: Insufficient documentation

## 2015-07-17 DIAGNOSIS — R05 Cough: Secondary | ICD-10-CM | POA: Diagnosis present

## 2015-07-17 DIAGNOSIS — Z87440 Personal history of urinary (tract) infections: Secondary | ICD-10-CM | POA: Diagnosis not present

## 2015-07-17 LAB — URINE MICROSCOPIC-ADD ON

## 2015-07-17 LAB — URINALYSIS, ROUTINE W REFLEX MICROSCOPIC
Bilirubin Urine: NEGATIVE
GLUCOSE, UA: NEGATIVE mg/dL
Hgb urine dipstick: NEGATIVE
Ketones, ur: NEGATIVE mg/dL
Nitrite: NEGATIVE
PROTEIN: NEGATIVE mg/dL
Specific Gravity, Urine: 1.025 (ref 1.005–1.030)
Urobilinogen, UA: 0.2 mg/dL (ref 0.0–1.0)
pH: 6 (ref 5.0–8.0)

## 2015-07-17 LAB — BASIC METABOLIC PANEL
Anion gap: 10 (ref 5–15)
BUN: 7 mg/dL (ref 6–20)
CHLORIDE: 107 mmol/L (ref 101–111)
CO2: 21 mmol/L — ABNORMAL LOW (ref 22–32)
Calcium: 8.9 mg/dL (ref 8.9–10.3)
Creatinine, Ser: 0.84 mg/dL (ref 0.44–1.00)
GFR calc Af Amer: >60 mL/min (ref >60)
GFR calc non Af Amer: >60 mL/min (ref >60)
Glucose, Bld: 95 mg/dL (ref 65–99)
POTASSIUM: 3.8 mmol/L (ref 3.5–5.1)
SODIUM: 138 mmol/L (ref 135–145)

## 2015-07-17 LAB — CBC WITH DIFFERENTIAL/PLATELET
Basophils Absolute: 0 10*3/uL (ref 0.0–0.1)
Basophils Relative: 0 %
EOS ABS: 0.1 10*3/uL (ref 0.0–0.7)
Eosinophils Relative: 1 %
HEMATOCRIT: 37.5 % (ref 36.0–46.0)
HEMOGLOBIN: 12.4 g/dL (ref 12.0–15.0)
Lymphocytes Relative: 30 %
Lymphs Abs: 1.8 10*3/uL (ref 0.7–4.0)
MCH: 31.8 pg (ref 26.0–34.0)
MCHC: 33.1 g/dL (ref 30.0–36.0)
MCV: 96.2 fL (ref 78.0–100.0)
Monocytes Absolute: 0.4 10*3/uL (ref 0.1–1.0)
Monocytes Relative: 6 %
NEUTROS ABS: 3.8 10*3/uL (ref 1.7–7.7)
NEUTROS PCT: 63 %
Platelets: 272 10*3/uL (ref 150–400)
RBC: 3.9 MIL/uL (ref 3.87–5.11)
RDW: 13.5 % (ref 11.5–15.5)
WBC: 6.1 10*3/uL (ref 4.0–10.5)

## 2015-07-17 LAB — PREGNANCY, URINE: PREG TEST UR: NEGATIVE

## 2015-07-17 MED ORDER — DEXTROSE 5 % IV SOLN
1.0000 g | Freq: Once | INTRAVENOUS | Status: AC
  Administered 2015-07-17: 1 g via INTRAVENOUS
  Filled 2015-07-17: qty 10

## 2015-07-17 MED ORDER — ALBUTEROL SULFATE (2.5 MG/3ML) 0.083% IN NEBU
2.5000 mg | INHALATION_SOLUTION | Freq: Once | RESPIRATORY_TRACT | Status: AC
  Administered 2015-07-17: 2.5 mg via RESPIRATORY_TRACT
  Filled 2015-07-17: qty 3

## 2015-07-17 MED ORDER — ALBUTEROL (5 MG/ML) CONTINUOUS INHALATION SOLN
10.0000 mg/h | INHALATION_SOLUTION | RESPIRATORY_TRACT | Status: AC
  Administered 2015-07-17: 10 mg/h via RESPIRATORY_TRACT
  Filled 2015-07-17: qty 20

## 2015-07-17 MED ORDER — SODIUM CHLORIDE 0.9 % IV BOLUS (SEPSIS): 1000.0000 mL | Freq: Once | INTRAVENOUS | Status: DC

## 2015-07-17 MED ORDER — DEXAMETHASONE SODIUM PHOSPHATE 10 MG/ML IJ SOLN
10.0000 mg | Freq: Once | INTRAMUSCULAR | Status: DC
Start: 2015-07-17 — End: 2015-07-17
  Filled 2015-07-17: qty 1

## 2015-07-17 MED ORDER — PROMETHAZINE-DM 6.25-15 MG/5ML PO SYRP: 5.0000 mL | ORAL_SOLUTION | Freq: Three times a day (TID) | ORAL | Status: DC | PRN

## 2015-07-17 MED ORDER — CEPHALEXIN 500 MG PO CAPS: 500.0000 mg | ORAL_CAPSULE | Freq: Two times a day (BID) | ORAL | Status: DC

## 2015-07-17 MED ORDER — ACETAMINOPHEN 325 MG PO TABS
650.0000 mg | ORAL_TABLET | Freq: Once | ORAL | Status: AC
  Administered 2015-07-17: 650 mg via ORAL
  Filled 2015-07-17: qty 2

## 2015-07-17 MED ORDER — DEXAMETHASONE 4 MG PO TABS
10.0000 mg | ORAL_TABLET | Freq: Once | ORAL | Status: AC
  Administered 2015-07-17: 10 mg via ORAL
  Filled 2015-07-17: qty 3

## 2015-07-17 NOTE — Discharge Instructions (Signed)
1. Continue to use Albuterol as prescribed for symptomatic relief. 2. Take  3. Follow up with   Asthma, Acute Bronchospasm Acute bronchospasm caused by asthma is also referred to as an asthma attack. Bronchospasm means your air passages become narrowed. The narrowing is caused by inflammation and tightening of the muscles in the air tubes (bronchi) in your lungs. This can make it hard to breathe or cause you to wheeze and cough. CAUSES Possible triggers are:  Animal dander from the skin, hair, or feathers of animals.  Dust mites contained in house dust.  Cockroaches.  Pollen from trees or grass.  Mold.  Cigarette or tobacco smoke.  Air pollutants such as dust, household cleaners, hair sprays, aerosol sprays, paint fumes, strong chemicals, or strong odors.  Cold air or weather changes. Cold air may trigger inflammation. Winds increase molds and pollens in the air.  Strong emotions such as crying or laughing hard.  Stress.  Certain medicines such as aspirin or beta-blockers.  Sulfites in foods and drinks, such as dried fruits and wine.  Infections or inflammatory conditions, such as a flu, cold, or inflammation of the nasal membranes (rhinitis).  Gastroesophageal reflux disease (GERD). GERD is a condition where stomach acid backs up into your esophagus.  Exercise or strenuous activity. SIGNS AND SYMPTOMS   Wheezing.  Excessive coughing, particularly at night.  Chest tightness.  Shortness of breath. DIAGNOSIS  Your health care provider will ask you about your medical history and perform a physical exam. A chest X-ray or blood testing may be performed to look for other causes of your symptoms or other conditions that may have triggered your asthma attack. TREATMENT  Treatment is aimed at reducing inflammation and opening up the airways in your lungs. Most asthma attacks are treated with inhaled medicines. These include quick relief or rescue medicines (such as  bronchodilators) and controller medicines (such as inhaled corticosteroids). These medicines are sometimes given through an inhaler or a nebulizer. Systemic steroid medicine taken by mouth or given through an IV tube also can be used to reduce the inflammation when an attack is moderate or severe. Antibiotic medicines are only used if a bacterial infection is present.  HOME CARE INSTRUCTIONS   Rest.  Drink plenty of liquids. This helps the mucus to remain thin and be easily coughed up. Only use caffeine in moderation and do not use alcohol until you have recovered from your illness.  Do not smoke. Avoid being exposed to secondhand smoke.  You play a critical role in keeping yourself in good health. Avoid exposure to things that cause you to wheeze or to have breathing problems.  Keep your medicines up-to-date and available. Carefully follow your health care provider's treatment plan.  Take your medicine exactly as prescribed.  When pollen or pollution is bad, keep windows closed and use an air conditioner or go to places with air conditioning.  Asthma requires careful medical care. See your health care provider for a follow-up as advised. If you are more than [redacted] weeks pregnant and you were prescribed any new medicines, let your obstetrician know about the visit and how you are doing. Follow up with your health care provider as directed.  After you have recovered from your asthma attack, make an appointment with your outpatient doctor to talk about ways to reduce the likelihood of future attacks. If you do not have a doctor who manages your asthma, make an appointment with a primary care doctor to discuss your asthma. SEEK IMMEDIATE  MEDICAL CARE IF:   You are getting worse.  You have trouble breathing. If severe, call your local emergency services (911 in the [place]  You develop chest pain or discomfort.  You are vomiting.  You are not able to keep fluids down.  You are coughing up  yellow, green, brown, or bloody sputum.  You have a fever and your symptoms suddenly get worse.  You have trouble swallowing. MAKE SURE YOU:   Understand these instructions.  Will watch your condition.  Will get help right away if you are not doing well or get worse. Document Released: 01/20/2007 Document Revised: 10/10/2013 Document Reviewed: 04/12/2013 [facility] Patient Information 2015 [place], [place]. This information is not intended to replace advice given to you by your health care provider. Make sure you discuss any questions you have with your health care provider.

## 2015-07-17 NOTE — ED Notes (Signed)
Ambulated pt on the unit pt saturations remained at 99% on room no complaints noted at this time

## 2015-07-17 NOTE — ED Notes (Signed)
IV team at bedside 

## 2015-07-17 NOTE — ED Notes (Signed)
respiratory at bedside.

## 2015-07-17 NOTE — ED Provider Notes (Signed)
CSN: [id number]     Arrival date & time 07/17/15  0909 History   First MD Initiated Contact with Patient 07/17/15 [phone]     Chief Complaint  Patient presents with  . Cough  . Back Pain     (Consider location/radiation/quality/duration/timing/severity/associated sxs/prior Treatment) HPI [name] is a 39 yo female with HTN, chronic pyelo/UTI, and asthma, presenting with 2 day h/o fever, cough, and right back pain.  Patient reports fever to 101.2F and chills at home, relieved with tylenol. However, she has had constant, nonproductive cough for 2 days unrelieved with Albuterol inhaler.  She endorses SOB, improved with sitting erect.  She denies runny nose or congestion.  Her son was diagnosed with LLL PNA 2 weeks ago, treated with Azithromycin and improved.   She also reports 1 week h/o right low back pain, at the site of her previous pyelo/stones.  She reports similar pain to previous episodes, though thinks she may be catching it early this time.  She says she has never had symptoms during the previous episodes, and denies urgency, frequency, dysuria, or hematuria.  She denies N/V.  Past Medical History  Diagnosis Date  . Hypertension   . Chronic UTI   . Complication of anesthesia   . Asthma   . Kidney stones   . Pyelonephritis, chronic   . Ovarian cyst   . Bacterial vaginosis   . Candidal vaginitis    Past Surgical History  Procedure Laterality Date  . Lithotripsy  2000's  . Cystoscopy  2001-2012    "total of 11; for chronic chronic pyelonephritis"  . Tubal ligation  2002  . Nephrostomy     Family History  Problem Relation Age of Onset  . HIV/AIDS Mother   . Hypertension Father    Social History  Substance Use Topics  . Smoking status: Current Every Day Smoker -- 0.25 packs/day for 18 years    Types: Cigarettes  . Smokeless tobacco: Never Used  . Alcohol Use: No   OB History    No data available     Review of Systems  Constitutional: Positive for fever and  chills.  HENT: Negative for congestion and rhinorrhea.   Respiratory: Positive for cough, chest tightness, shortness of breath and wheezing.   Cardiovascular: Negative for chest pain, palpitations and leg swelling.  Gastrointestinal: Negative for nausea, vomiting, abdominal pain, diarrhea and constipation.  Endocrine: Negative for polyuria.  Genitourinary: Negative for dysuria, urgency, frequency and difficulty urinating.  Musculoskeletal: Negative for myalgias.  Skin: Positive for rash.  Neurological: Negative for dizziness, light-headedness and headaches.      Allergies  Flagyl; Ketorolac tromethamine; Metoclopramide hcl; Percocet; and Zofran  Home Medications   Prior to Admission medications   Medication Sig Start Date End Date Taking? Authorizing [name]  acetaminophen (TYLENOL) 500 MG tablet Take 1,000 mg by mouth every 6 (six) hours as needed for moderate pain.   Yes Historical Selden Noteboom, MD  albuterol (PROVENTIL HFA;VENTOLIN HFA) 108 (90 BASE) MCG/ACT inhaler Inhale 2 puffs into the lungs every 6 (six) hours as needed for wheezing. For wheezing   Yes Historical Orien Mayhall, MD  guaiFENesin (MUCINEX) 600 MG 12 hr tablet Take 1,200 mg by mouth 2 (two) times daily.   Yes Historical Harshika Mago, MD  hydrochlorothiazide (HYDRODIURIL) 12.5 MG tablet Take 1 tablet (12.5 mg total) by mouth daily. 10/14/14  Yes Mirian Mo, MD  metoprolol tartrate (LOPRESSOR) 25 MG tablet Take 12.5 mg by mouth 2 (two) times daily.   Yes Historical Mianna Iezzi, MD  metoprolol succinate (TOPROL-XL) 25 MG 24 hr tablet Take 1 tablet (25 mg total) by mouth daily. Patient not taking: Reported on 07/17/2015 10/14/14   Mirian Mo, MD  promethazine (PHENERGAN) 12.5 MG tablet Take 1 tablet (12.5 mg total) by mouth every 6 (six) hours as needed for nausea or vomiting. Patient not taking: Reported on 07/17/2015 06/29/14   Joycie Peek, PA-C  ranitidine (ZANTAC) 150 MG capsule Take 1 capsule (150 mg total) by mouth  daily. Patient not taking: Reported on 07/17/2015 06/29/14   Joycie Peek, PA-C   BP 166/133 mmHg  Pulse 101  Temp(Src) 99 F (37.2 C) (Oral)  Resp 24  SpO2 96%  LMP 06/28/2015 Physical Exam  Constitutional: She is oriented to person, place, and time. She appears well-developed and well-nourished.  Moderate respiratory distress.  HENT:  Head: Normocephalic and atraumatic.  Eyes: EOM are normal. No scleral icterus.  Tearing during coughing fits.  Neck: Normal range of motion. No JVD present. No tracheal deviation present.  Cardiovascular: Regular rhythm and normal heart sounds.   Tachycardic  Pulmonary/Chest:  Tachypnic. Increased WOB with accessory muscles. No retractions.  No wheezes appreciated.  Abdominal: Soft. She exhibits no distension. There is no tenderness. There is no rebound.  Musculoskeletal: She exhibits no edema.  Neurological: She is alert and oriented to person, place, and time.  Skin: Skin is warm and dry. No rash noted.    ED Course  Procedures (including critical care time) Labs Review Labs Reviewed  BASIC METABOLIC PANEL - Abnormal; Notable for the following:    CO2 21 (*)    All other components within normal limits  URINALYSIS, ROUTINE W REFLEX MICROSCOPIC (NOT AT [facility]) - Abnormal; Notable for the following:    APPearance CLOUDY (*)    Leukocytes, UA MODERATE (*)    All other components within normal limits  URINE MICROSCOPIC-ADD ON - Abnormal; Notable for the following:    Bacteria, UA FEW (*)    All other components within normal limits  URINE CULTURE  CBC WITH DIFFERENTIAL/PLATELET  PREGNANCY, URINE    Imaging Review Dg Chest 2 View  07/17/2015   CLINICAL DATA:  Shortness breath and cough.  EXAM: CHEST  2 VIEW  COMPARISON:  CT chest 02/22/2015  FINDINGS: The heart size and mediastinal contours are within normal limits. Both lungs are clear. The visualized skeletal structures are unremarkable.  IMPRESSION: No active cardiopulmonary disease.    Electronically Signed   By: Elige Ko   On: 07/17/2015 10:27   I have personally reviewed and evaluated these images and lab results as part of my medical decision-making.   EKG Interpretation None      MDM   Final diagnoses:  None   Asthma: CXR clear. WBC normal.  Minimal relief with Albuterol neb once, but patient reports good relief with continuous Albuterol neb.  Patient given Decadron .  Expected Peak Flow 440. Patient's peak flow after continuous nebulizer 250 while seated.  Patient thinks her remaining chest discomfort is 2/2 continued cough. She ambulated well without desaturation.  She feels safe to go home with clear understanding for reasons to return to ED.  Will provide patient with anti-cough medication. - Promethazine DM 5 mL TID PRN cough  UTI: UA with many leukocytes and back pain.  Patient has h/o chronic UTI.  Will treat with CTX 1g IV.  Patient to follow up with PCP.  Jana Half, MD 07/17/15 1457  Ambrose Finland Clarene Duke, MD 07/21/15 [phone]

## 2015-07-17 NOTE — Progress Notes (Signed)
Peak flow spirometry performed by patient, with good effort.  Best result of 250.

## 2015-07-17 NOTE — ED Notes (Signed)
Pt reports intermittent back pain for over a week, having fever, non productive cough.

## 2015-07-19 LAB — URINE CULTURE

## 2015-07-20 ENCOUNTER — Telehealth (HOSPITAL_BASED_OUTPATIENT_CLINIC_OR_DEPARTMENT_OTHER): Payer: Self-pay | Admitting: Emergency Medicine

## 2015-07-20 NOTE — Telephone Encounter (Signed)
Post ED Visit - Positive Culture Follow-up: Successful Patient Follow-Up  Culture assessed and recommendations reviewed by:  Celedonio Miyamoto, Pharm.D., BCPS-AQ ID  Georgina Pillion, Pharm.D., BCPS  [place], [address].D., BCPS, AAHIVP  Estella Husk, Pharm.D., BCPS, AAHIVP  [place], [address].D.  Tennis Must, Pharm.D. Velva Harman PharmD  Positive urine culture enterococcus   Patient discharged without antimicrobial prescription and treatment is now indicated  Organism is resistant to prescribed ED discharge antimicrobial  Patient with positive blood cultures  Changes discussed with ED provider: Lonna Cobb PA New antibiotic prescription stop keflex, start amoxicillin  po bid x 10 days Called to [employer] [place]  Contacted patient, 07/20/15 1530   [name] 07/20/2015, 3:33 PM

## 2015-07-20 NOTE — Progress Notes (Addendum)
ED Antimicrobial Stewardship Positive Culture Follow Up   [name] is an 39 y.o. female who presented to [facility] on 07/17/2015 with a chief complaint of  Chief Complaint  Patient presents with  . Cough  . Back Pain    Recent Results (from the past 720 hour(s))  Urine culture     Status: None   Collection Time: 07/17/15  9:45 AM  Result Value Ref Range Status   Specimen Description URINE, RANDOM  Final   Special Requests ADDED [id number]  Final   Culture >=100,000 COLONIES/mL ENTEROCOCCUS SPECIES  Final   Report Status 07/19/2015 FINAL  Final   Organism ID, Bacteria ENTEROCOCCUS SPECIES  Final      Susceptibility   Enterococcus species - MIC*    AMPICILLIN <=2 SENSITIVE Sensitive     LEVOFLOXACIN 0.5 SENSITIVE Sensitive     NITROFURANTOIN <=16 SENSITIVE Sensitive     VANCOMYCIN 1 SENSITIVE Sensitive     * >=100,000 COLONIES/mL ENTEROCOCCUS SPECIES     Treated with Keflex, organism resistant to prescribed antimicrobial  New antibiotic prescription: Discontinue Keflex. Start amoxicillin  po bid x 10 days  ED Provider: Alfredo Batty, NP   Babs Bertin P 07/20/2015, 11:23 AM Clinical Pharmacist Phone# [phone]   Addendum: Change in antibiotic ok.  Continue Amoxicillin 500 mg PO BID for 10 days.

## 2016-09-18 IMAGING — DX DG CHEST 2V
2 series · 2 of 2 positions shown · non-contrast
Comparison: CT chest 02/22/2015

CLINICAL DATA: Shortness breath and cough.

EXAM:
CHEST  2 VIEW

[chest pa]
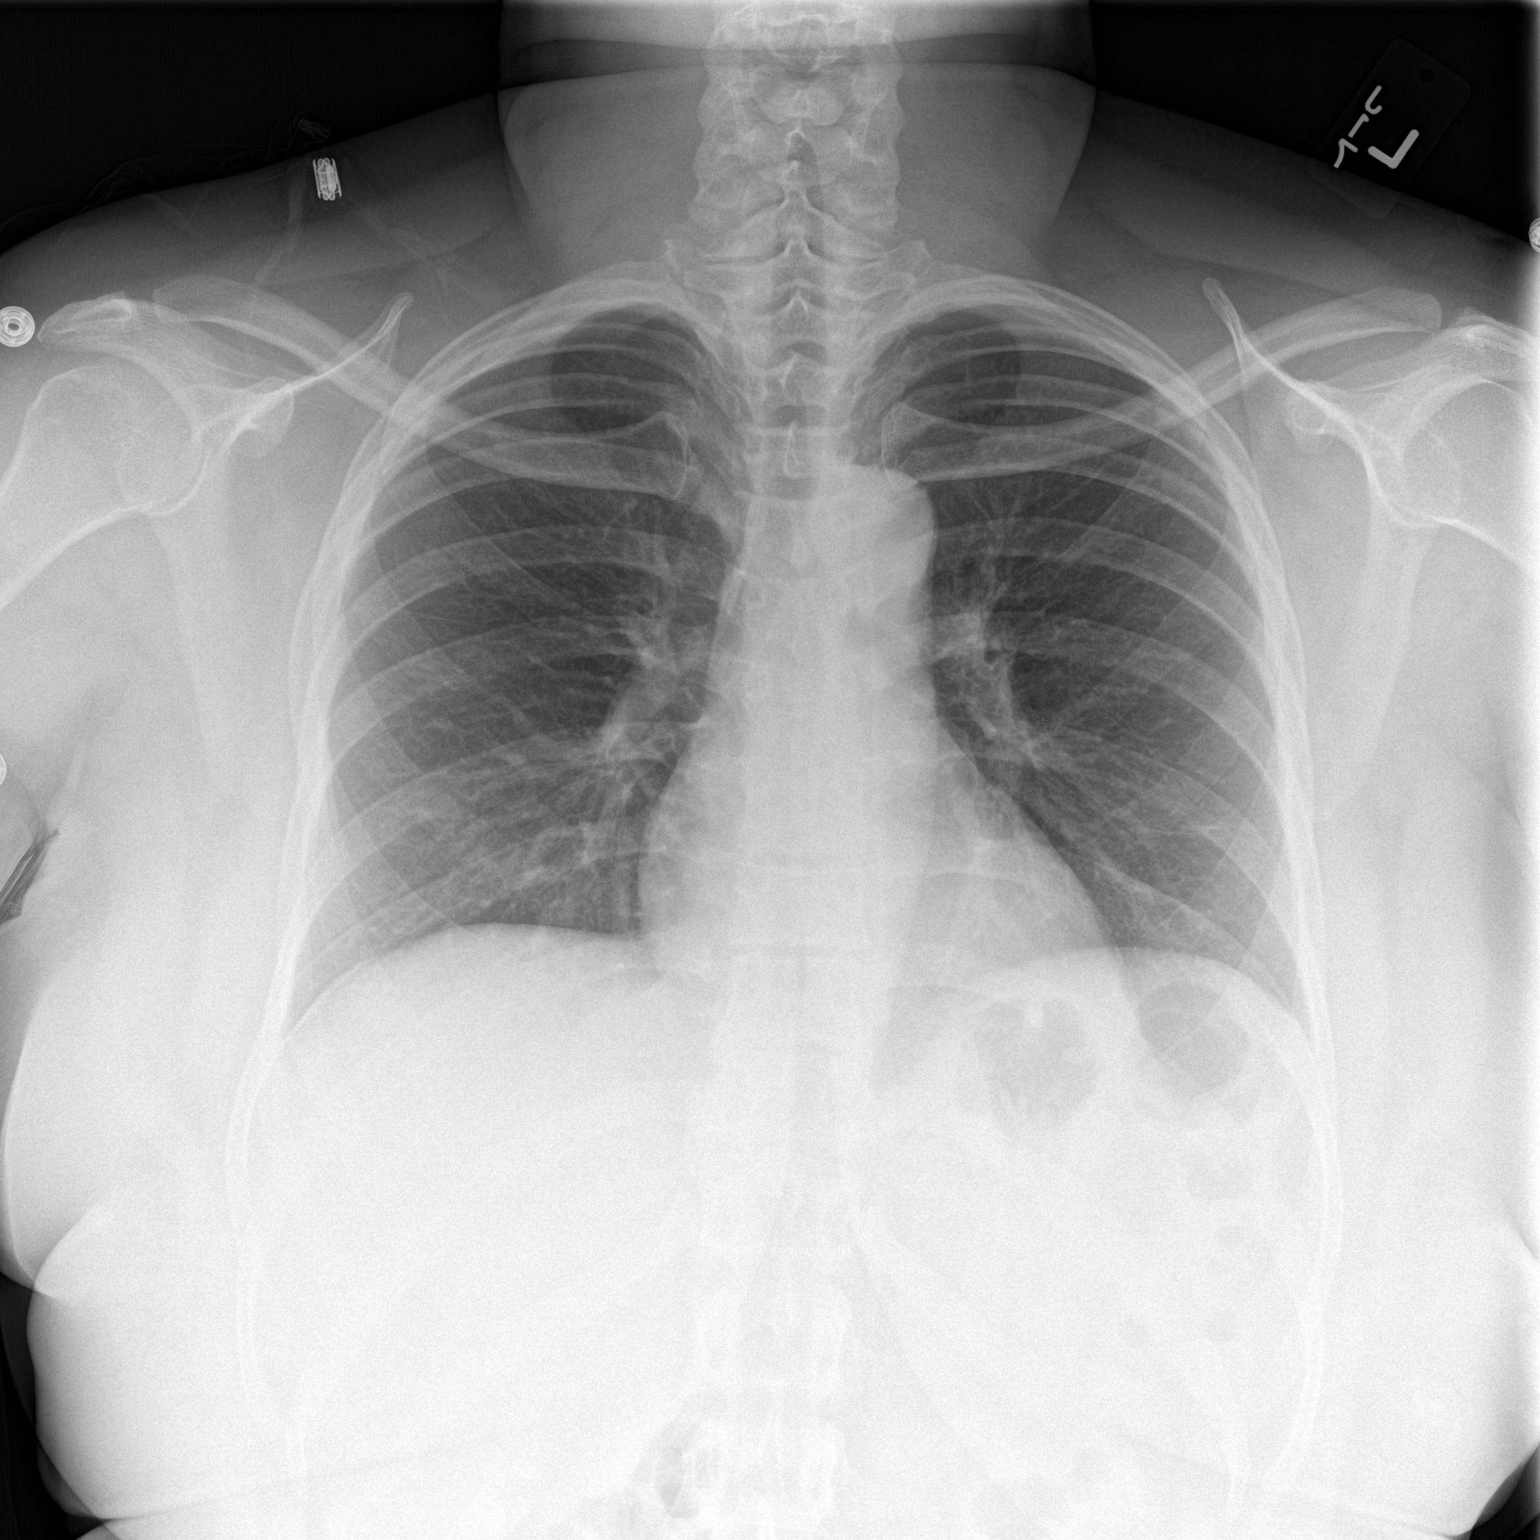

[chest lat]
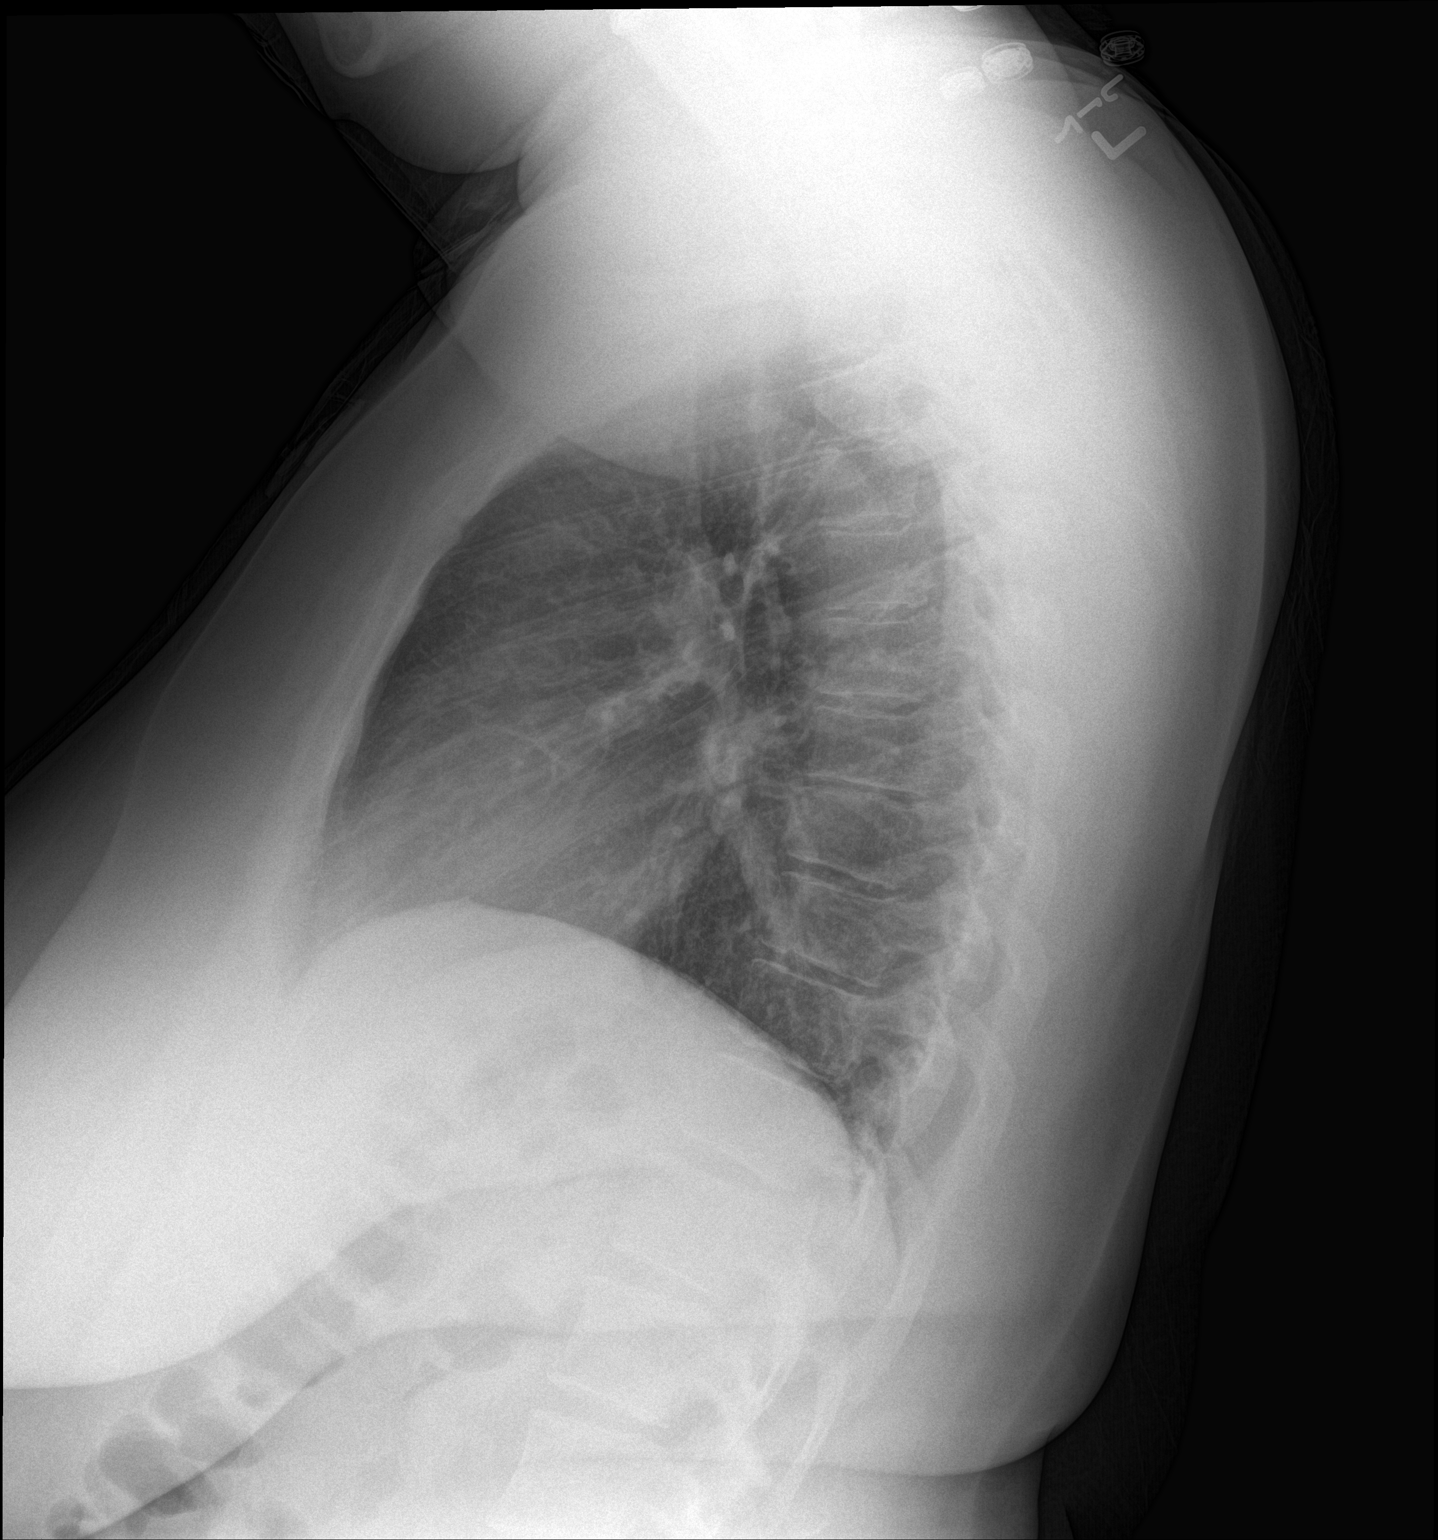

[2 of 2 positions shown; findings below may reference images not displayed]

FINDINGS: The heart size and mediastinal contours are within normal limits.
Both lungs are clear. The visualized skeletal structures are
unremarkable.
IMPRESSION: No active cardiopulmonary disease.

## 2017-01-13 ENCOUNTER — Emergency Department (HOSPITAL_BASED_OUTPATIENT_CLINIC_OR_DEPARTMENT_OTHER): Payer: Self-pay

## 2017-01-13 ENCOUNTER — Encounter (HOSPITAL_BASED_OUTPATIENT_CLINIC_OR_DEPARTMENT_OTHER): Payer: Self-pay | Admitting: Emergency Medicine

## 2017-01-13 ENCOUNTER — Emergency Department (HOSPITAL_BASED_OUTPATIENT_CLINIC_OR_DEPARTMENT_OTHER)
Admission: EM | Admit: 2017-01-13 | Discharge: 2017-01-13 | Disposition: A | Discharge status: Home / Self Care | Payer: Self-pay | Attending: Emergency Medicine | Admitting: Emergency Medicine

## 2017-01-13 DIAGNOSIS — Z79899 Other long term (current) drug therapy: Secondary | ICD-10-CM | POA: Insufficient documentation

## 2017-01-13 DIAGNOSIS — J45909 Unspecified asthma, uncomplicated: Secondary | ICD-10-CM | POA: Insufficient documentation

## 2017-01-13 DIAGNOSIS — I10 Essential (primary) hypertension: Secondary | ICD-10-CM | POA: Insufficient documentation

## 2017-01-13 DIAGNOSIS — N12 Tubulo-interstitial nephritis, not specified as acute or chronic: Secondary | ICD-10-CM | POA: Insufficient documentation

## 2017-01-13 DIAGNOSIS — F17210 Nicotine dependence, cigarettes, uncomplicated: Secondary | ICD-10-CM | POA: Insufficient documentation

## 2017-01-13 LAB — CBC WITH DIFFERENTIAL/PLATELET
Basophils Absolute: 0 10*3/uL (ref 0.0–0.1)
Basophils Relative: 0 %
Eosinophils Absolute: 0.1 10*3/uL (ref 0.0–0.7)
Eosinophils Relative: 1 %
HCT: 35.9 % — ABNORMAL LOW (ref 36.0–46.0)
HEMOGLOBIN: 12 g/dL (ref 12.0–15.0)
LYMPHS ABS: 1.8 10*3/uL (ref 0.7–4.0)
LYMPHS PCT: 32 %
MCH: 31.8 pg (ref 26.0–34.0)
MCHC: 33.4 g/dL (ref 30.0–36.0)
MCV: 95.2 fL (ref 78.0–100.0)
Monocytes Absolute: 0.4 10*3/uL (ref 0.1–1.0)
Monocytes Relative: 7 %
NEUTROS PCT: 60 %
Neutro Abs: 3.3 10*3/uL (ref 1.7–7.7)
Platelets: 301 10*3/uL (ref 150–400)
RBC: 3.77 MIL/uL — AB (ref 3.87–5.11)
RDW: 13.4 % (ref 11.5–15.5)
WBC: 5.6 10*3/uL (ref 4.0–10.5)

## 2017-01-13 LAB — BASIC METABOLIC PANEL
Anion gap: 6 (ref 5–15)
BUN: 13 mg/dL (ref 6–20)
CHLORIDE: 106 mmol/L (ref 101–111)
CO2: 26 mmol/L (ref 22–32)
CREATININE: 1 mg/dL (ref 0.44–1.00)
Calcium: 8.7 mg/dL — ABNORMAL LOW (ref 8.9–10.3)
GFR calc Af Amer: >60 mL/min (ref >60)
GFR calc non Af Amer: >60 mL/min (ref >60)
GLUCOSE: 103 mg/dL — AB (ref 65–99)
POTASSIUM: 3.5 mmol/L (ref 3.5–5.1)
Sodium: 138 mmol/L (ref 135–145)

## 2017-01-13 LAB — URINALYSIS, ROUTINE W REFLEX MICROSCOPIC
BILIRUBIN URINE: NEGATIVE
GLUCOSE, UA: NEGATIVE mg/dL
KETONES UR: NEGATIVE mg/dL
NITRITE: POSITIVE — AB
PH: 5.5 (ref 5.0–8.0)
Protein, ur: 100 mg/dL — AB
SPECIFIC GRAVITY, URINE: 1.023 (ref 1.005–1.030)

## 2017-01-13 LAB — URINALYSIS, MICROSCOPIC (REFLEX)

## 2017-01-13 LAB — PREGNANCY, URINE: Preg Test, Ur: NEGATIVE

## 2017-01-13 MED ORDER — CIPROFLOXACIN HCL 500 MG PO TABS
500.0000 mg | ORAL_TABLET | Freq: Once | ORAL | Status: AC
  Administered 2017-01-13: 500 mg via ORAL

## 2017-01-13 MED ORDER — PROMETHAZINE HCL 25 MG/ML IJ SOLN: 12.5000 mg | Freq: Once | INTRAMUSCULAR | Status: DC

## 2017-01-13 MED ORDER — PROMETHAZINE HCL 25 MG PO TABS
50.0000 mg | ORAL_TABLET | Freq: Once | ORAL | Status: AC
  Administered 2017-01-13: 50 mg via ORAL
  Filled 2017-01-13: qty 2

## 2017-01-13 MED ORDER — ACETAMINOPHEN 500 MG PO TABS
1000.0000 mg | ORAL_TABLET | Freq: Once | ORAL | Status: AC
  Administered 2017-01-13: 1000 mg via ORAL

## 2017-01-13 MED ORDER — CIPROFLOXACIN HCL 500 MG PO TABS
ORAL_TABLET | ORAL | Status: AC
  Filled 2017-01-13: qty 1

## 2017-01-13 MED ORDER — CIPROFLOXACIN HCL 500 MG PO TABS: 500.0000 mg | ORAL_TABLET | Freq: Two times a day (BID) | ORAL | 0 refills | Status: DC

## 2017-01-13 MED ORDER — ACETAMINOPHEN 500 MG PO TABS
ORAL_TABLET | ORAL | Status: AC
  Filled 2017-01-13: qty 2

## 2017-01-13 MED ORDER — SODIUM CHLORIDE 0.9 % IV BOLUS (SEPSIS): 1000.0000 mL | Freq: Once | INTRAVENOUS | Status: DC

## 2017-01-13 MED FILL — CIPROFLOXACIN HCL 500 MG TA: 500 | 14 days supply | Qty: 28 | Fill #0

## 2017-01-13 NOTE — ED Provider Notes (Signed)
[facility] Provider Note   CSN: [id number] Arrival date & time: 01/13/17  0840     History   Chief Complaint Chief Complaint  Patient presents with  . Flank Pain    HPI [name] is a 41 y.o. female.Complains of right flank pain nonradiating for 2 weeks. She developed vomiting yesterday she continues to have dry heaves today. She denies fever she does admit to chills. She reports that she was treated for positive first 2 weeks ago, treated with Macrobid which she has completed. No other associated symptoms. She denies abdominal pain. Last normal menstrual period 2 weeks ago.  HPI  Past Medical History:  Diagnosis Date  . Asthma   . Bacterial vaginosis   . Candidal vaginitis   . Chronic UTI   . Complication of anesthesia   . Hypertension   . Kidney stones   . Ovarian cyst   . Pyelonephritis, chronic     Patient Active Problem List   Diagnosis Date Noted  . Vomiting 04/21/2012  . Pyelonephritis 04/20/2012  . HTN (hypertension) 04/20/2012  . Asthma 04/20/2012    Past Surgical History:  Procedure Laterality Date  . CYSTOSCOPY  2001-2012   "total of 11; for chronic chronic pyelonephritis"  . LITHOTRIPSY  2000's  . nephrostomy    . TUBAL LIGATION  2002    OB History    No data available       Home Medications    Prior to Admission medications   Medication Sig Start Date End Date Taking? Authorizing Provider  metoprolol succinate (TOPROL-XL) 25 MG 24 hr tablet Take 1 tablet (25 mg total) by mouth daily. 10/14/14  Yes Mirian MoMatthew Gentry, MD  promethazine (PHENERGAN) 12.5 MG tablet Take 1 tablet (12.5 mg total) by mouth every 6 (six) hours as needed for nausea or vomiting. 06/29/14  Yes Joycie PeekBenjamin Cartner, PA-C  acetaminophen (TYLENOL) 500 MG tablet Take 1,000 mg by mouth every 6 (six) hours as needed for moderate pain.    Historical Provider, MD  albuterol (PROVENTIL HFA;VENTOLIN HFA) 108 (90 BASE) MCG/ACT inhaler Inhale 2 puffs into the lungs  every 6 (six) hours as needed for wheezing. For wheezing    Historical Provider, MD  cephALEXin (KEFLEX) 500 MG capsule Take 1 capsule (500 mg total) by mouth 2 (two) times daily. 07/17/15   Laurence Spatesachel Morgan Little, MD  guaiFENesin (MUCINEX) 600 MG 12 hr tablet Take 1,200 mg by mouth 2 (two) times daily.    Historical Provider, MD  hydrochlorothiazide (HYDRODIURIL) 12.5 MG tablet Take 1 tablet (12.5 mg total) by mouth daily. 10/14/14   Mirian MoMatthew Gentry, MD  metoprolol tartrate (LOPRESSOR) 25 MG tablet Take 12.5 mg by mouth 2 (two) times daily.    Historical Provider, MD  promethazine-dextromethorphan (PROMETHAZINE-DM) 6.25-15 MG/5ML syrup Take 5 mLs by mouth 3 (three) times daily as needed for cough. 07/17/15   Jana HalfNicholas A Taylor, MD  ranitidine (ZANTAC) 150 MG capsule Take 1 capsule (150 mg total) by mouth daily. Patient not taking: Reported on 07/17/2015 06/29/14   Joycie PeekBenjamin Cartner, PA-C    Family History Family History  Problem Relation Age of Onset  . HIV/AIDS Mother   . Hypertension Father     Social History Social History  Substance Use Topics  . Smoking status: Current Every Day Smoker    Packs/day: 0.25    Years: 18.00    Types: Cigarettes  . Smokeless tobacco: Never Used  . Alcohol use No     Allergies   Flagyl [  metronidazole hcl]; Ketorolac tromethamine; Metoclopramide hcl; Percocet [oxycodone-acetaminophen]; and Zofran [ondansetron hcl]   Review of Systems Review of Systems  Gastrointestinal: Positive for nausea and vomiting.  Genitourinary: Positive for flank pain.  All other systems reviewed and are negative.    Physical Exam Updated Vital Signs BP 123/71 (BP Location: Right Arm)   Pulse 85   Temp 98.5 F (36.9 C) (Oral)   Resp 16   Ht 5\' 5"  (1.651 m)   Wt 229 lb (103.9 kg)   LMP 12/28/2016   SpO2 100%   BMI 38.11 kg/m   Physical Exam  Constitutional: She is oriented to person, place, and time. She appears well-developed and well-nourished. No distress.    HENT:  Head: Normocephalic and atraumatic.  Eyes: Conjunctivae are normal. Pupils are equal, round, and reactive to light.  Neck: Neck supple. No tracheal deviation present. No thyromegaly present.  Cardiovascular: Normal rate and regular rhythm.   No murmur heard. Pulmonary/Chest: Effort normal and breath sounds normal.  Abdominal: Soft. Bowel sounds are normal. She exhibits no distension. There is no tenderness.  Obese  Genitourinary:  Genitourinary Comments: Right flank tenderness  Musculoskeletal: Normal range of motion. She exhibits no edema or tenderness.  Neurological: She is alert and oriented to person, place, and time. Coordination normal.  Skin: Skin is warm and dry. No rash noted.  Psychiatric: She has a normal mood and affect.  Nursing note and vitals reviewed.    ED Treatments / Results  Labs (all labs ordered are listed, but only abnormal results are displayed) Labs Reviewed  PREGNANCY, URINE  URINALYSIS, ROUTINE W REFLEX MICROSCOPIC  CBC WITH DIFFERENTIAL/PLATELET  BASIC METABOLIC PANEL    EKG  EKG Interpretation None      Results for orders placed or performed during the hospital encounter of 01/13/17  Urinalysis, Routine w reflex microscopic  Result Value Ref Range   Color, Urine AMBER (A) YELLOW   APPearance TURBID (A) CLEAR   Specific Gravity, Urine 1.023 1.005 - 1.030   pH 5.5 5.0 - 8.0   Glucose, UA NEGATIVE NEGATIVE mg/dL   Hgb urine dipstick LARGE (A) NEGATIVE   Bilirubin Urine NEGATIVE NEGATIVE   Ketones, ur NEGATIVE NEGATIVE mg/dL   Protein, ur 063 (A) NEGATIVE mg/dL   Nitrite POSITIVE (A) NEGATIVE   Leukocytes, UA LARGE (A) NEGATIVE  Pregnancy, urine  Result Value Ref Range   Preg Test, Ur NEGATIVE NEGATIVE  CBC with Differential/Platelet  Result Value Ref Range   WBC 5.6 4.0 - 10.5 K/uL   RBC 3.77 (L) 3.87 - 5.11 MIL/uL   Hemoglobin 12.0 12.0 - 15.0 g/dL   HCT 01.6 (L) 01.0 - 93.2 %   MCV 95.2 78.0 - 100.0 fL   MCH 31.8 26.0 -  34.0 pg   MCHC 33.4 30.0 - 36.0 g/dL   RDW 35.5 73.2 - 20.2 %   Platelets 301 150 - 400 K/uL   Neutrophils Relative % 60 %   Neutro Abs 3.3 1.7 - 7.7 K/uL   Lymphocytes Relative 32 %   Lymphs Abs 1.8 0.7 - 4.0 K/uL   Monocytes Relative 7 %   Monocytes Absolute 0.4 0.1 - 1.0 K/uL   Eosinophils Relative 1 %   Eosinophils Absolute 0.1 0.0 - 0.7 K/uL   Basophils Relative 0 %   Basophils Absolute 0.0 0.0 - 0.1 K/uL  Basic metabolic panel  Result Value Ref Range   Sodium 138 135 - 145 mmol/L   Potassium 3.5 3.5 - 5.1 mmol/L  Chloride 106 101 - 111 mmol/L   CO2 26 22 - 32 mmol/L   Glucose, Bld 103 (H) 65 - 99 mg/dL   BUN 13 6 - 20 mg/dL   Creatinine, Ser 1.61 0.44 - 1.00 mg/dL   Calcium 8.7 (L) 8.9 - 10.3 mg/dL   GFR calc non Af Amer >60 >60 mL/min   GFR calc Af Amer >60 >60 mL/min   Anion gap 6 5 - 15  Urinalysis, Microscopic (reflex)  Result Value Ref Range   RBC / HPF 6-30 0 - 5 RBC/hpf   WBC, UA TOO NUMEROUS TO COUNT 0 - 5 WBC/hpf   Bacteria, UA MANY (A) NONE SEEN   Squamous Epithelial / LPF 6-30 (A) NONE SEEN   WBC Clumps PRESENT    Ct Renal Stone Study  Result Date: 01/13/2017 CLINICAL DATA:  Right flank pain for 4 days EXAM: CT ABDOMEN AND PELVIS WITHOUT CONTRAST TECHNIQUE: Multidetector CT imaging of the abdomen and pelvis was performed following the standard protocol without IV contrast. COMPARISON:  12/15/2016 FINDINGS: Lower chest: No acute abnormality. Hepatobiliary: No focal liver abnormality is seen. No gallstones, gallbladder wall thickening, or biliary dilatation. Pancreas: Unremarkable. No pancreatic ductal dilatation or surrounding inflammatory changes. Spleen: Normal in size without focal abnormality. Adrenals/Urinary Tract: Normal adrenal glands. Normal appearance of the left kidney. No mass or hydronephrosis. Chronic multifocal areas of scarring in atrophied involve the right kidney. There is scratch set again noted is subcapsular calcification overlying the upper  pole of the right kidney. Multiple interpolar and lower pole right renal calculi are again noted. The largest stone is in the inferior pole measuring 2.1 cm, image 66 of series 5. No right-sided hydronephrosis or hydroureter. No ureteral lithiasis. When compared with the previous exam the appearance of the right kidney and the right renal calculi are unchanged. The urinary bladder appears normal. Stomach/Bowel: Stomach is within normal limits. Appendix appears normal. No evidence of bowel wall thickening, distention, or inflammatory changes. Vascular/Lymphatic: No significant vascular findings are present. No enlarged abdominal or pelvic lymph nodes. Reproductive: Uterus and bilateral adnexa are unremarkable. Other: No abdominal wall hernia or abnormality. No abdominopelvic ascites. Musculoskeletal: No acute or significant osseous findings. IMPRESSION: 1. Chronic scarring of the right kidney with multiple nonobstructing renal calculi. Unchanged from previous exam. Electronically Signed   By: Signa Kell M.D.   On: 01/13/2017 11:16    Radiology No results found.  Procedures Procedures (including critical care time)  Medications Ordered in ED Medications  sodium chloride 0.9 % bolus 1,000 mL (not administered)  promethazine (PHENERGAN) injection 12.5 mg (not administered)   12 noon patient resting comfortably after treatment with oral Phenergan no nausea pain is minimal after treatment with Tylenol. She is able to drink without difficulty.  Initial Impression / Assessment and Plan / ED Course  I have reviewed the triage vital signs and the nursing notes.  Pertinent labs & imaging results that were available during my care of the patient were reviewed by me and considered in my medical decision making (see chart for details).     I feel that patient be treated with second round of oral antibiotics in that she's not ill-appearing, prefers to go home pop no fever. Vomited well controlled with  Phenergan which she had not taken at home. Plan prescription Cipro urine sent for culture. Tylenol for pain. She has Phenergan tablets at home which she has not used that she can take 50 mg every 6 hours as needed for  nausea  Final Clinical Impressions(s) / ED Diagnoses  Dx acute pyelonephritis Final diagnoses:  None    New Prescriptions New Prescriptions   No medications on file     Doug Sou, MD 01/13/17 1222

## 2017-01-13 NOTE — ED Triage Notes (Signed)
Right flank pain x 4 days, with n/v. Recently completed macrobid for pyelonephritis .

## 2017-01-13 NOTE — Discharge Instructions (Signed)
You may take Phenergan 25-50 mg every 6 hours by mouth as needed for nausea. Take Tylenol every 4 hours as needed for pain. Take the antibiotic as prescribed. See your primary care physician or call the number on this chart to get a new primary care physician if not feeling better in a week. Return for uncontrolled vomiting, fever, uncontrolled pain or if concern for any reason

## 2017-01-13 NOTE — ED Notes (Signed)
L radial Artery x 1 attempt for labs. Pt tolerated well. No complications noted.

## 2017-01-13 NOTE — ED Notes (Signed)
Attempted IV x 2 unsuccessful, to RAC, tol well

## 2017-01-13 NOTE — ED Notes (Signed)
Attempted IV x 3 by Rebeka RN, tol well. ED MD informed. Labs obtained by RT with art stick to left wrist, tol well

## 2017-01-13 NOTE — ED Notes (Signed)
Patient is resting comfortably. 

## 2017-01-14 LAB — URINE CULTURE

## 2017-05-31 ENCOUNTER — Encounter (HOSPITAL_BASED_OUTPATIENT_CLINIC_OR_DEPARTMENT_OTHER): Payer: Self-pay | Admitting: *Deleted

## 2017-05-31 ENCOUNTER — Emergency Department (HOSPITAL_BASED_OUTPATIENT_CLINIC_OR_DEPARTMENT_OTHER)
Admission: EM | Admit: 2017-05-31 | Discharge: 2017-06-01 | Disposition: A | Discharge status: Home / Self Care | Payer: 59 | Attending: Emergency Medicine | Admitting: Emergency Medicine

## 2017-05-31 DIAGNOSIS — Z79899 Other long term (current) drug therapy: Secondary | ICD-10-CM | POA: Insufficient documentation

## 2017-05-31 DIAGNOSIS — J45909 Unspecified asthma, uncomplicated: Secondary | ICD-10-CM | POA: Insufficient documentation

## 2017-05-31 DIAGNOSIS — R519 Headache, unspecified: Secondary | ICD-10-CM

## 2017-05-31 DIAGNOSIS — N3000 Acute cystitis without hematuria: Secondary | ICD-10-CM | POA: Insufficient documentation

## 2017-05-31 DIAGNOSIS — R51 Headache: Secondary | ICD-10-CM

## 2017-05-31 DIAGNOSIS — F17210 Nicotine dependence, cigarettes, uncomplicated: Secondary | ICD-10-CM | POA: Insufficient documentation

## 2017-05-31 DIAGNOSIS — I10 Essential (primary) hypertension: Secondary | ICD-10-CM | POA: Insufficient documentation

## 2017-05-31 LAB — CBC WITH DIFFERENTIAL/PLATELET
BASOS ABS: 0 10*3/uL (ref 0.0–0.1)
Basophils Relative: 1 %
Eosinophils Absolute: 0.1 10*3/uL (ref 0.0–0.7)
Eosinophils Relative: 2 %
HEMATOCRIT: 36.3 % (ref 36.0–46.0)
Hemoglobin: 12.1 g/dL (ref 12.0–15.0)
LYMPHS PCT: 41 %
Lymphs Abs: 2.3 10*3/uL (ref 0.7–4.0)
MCH: 30.9 pg (ref 26.0–34.0)
MCHC: 33.3 g/dL (ref 30.0–36.0)
MCV: 92.6 fL (ref 78.0–100.0)
MONO ABS: 0.3 10*3/uL (ref 0.1–1.0)
MONOS PCT: 5 %
NEUTROS ABS: 3 10*3/uL (ref 1.7–7.7)
Neutrophils Relative %: 51 %
Platelets: 296 10*3/uL (ref 150–400)
RBC: 3.92 MIL/uL (ref 3.87–5.11)
RDW: 13.3 % (ref 11.5–15.5)
WBC: 5.7 10*3/uL (ref 4.0–10.5)

## 2017-05-31 LAB — COMPREHENSIVE METABOLIC PANEL
ALT: 14 U/L (ref 14–54)
AST: 17 U/L (ref 15–41)
Albumin: 4.4 g/dL (ref 3.5–5.0)
Alkaline Phosphatase: 71 U/L (ref 38–126)
Anion gap: 9 (ref 5–15)
BILIRUBIN TOTAL: 0.4 mg/dL (ref 0.3–1.2)
BUN: 11 mg/dL (ref 6–20)
CALCIUM: 8.9 mg/dL (ref 8.9–10.3)
CO2: 26 mmol/L (ref 22–32)
CREATININE: 1.01 mg/dL — AB (ref 0.44–1.00)
Chloride: 103 mmol/L (ref 101–111)
GFR calc Af Amer: >60 mL/min (ref >60)
Glucose, Bld: 100 mg/dL — ABNORMAL HIGH (ref 65–99)
POTASSIUM: 3.5 mmol/L (ref 3.5–5.1)
Sodium: 138 mmol/L (ref 135–145)
TOTAL PROTEIN: 8.2 g/dL — AB (ref 6.5–8.1)

## 2017-05-31 LAB — URINALYSIS, ROUTINE W REFLEX MICROSCOPIC
BILIRUBIN URINE: NEGATIVE
Glucose, UA: NEGATIVE mg/dL
KETONES UR: NEGATIVE mg/dL
Nitrite: NEGATIVE
PROTEIN: 30 mg/dL — AB
Specific Gravity, Urine: 1.025 (ref 1.005–1.030)
pH: 5.5 (ref 5.0–8.0)

## 2017-05-31 LAB — URINALYSIS, MICROSCOPIC (REFLEX)

## 2017-05-31 LAB — PREGNANCY, URINE: PREG TEST UR: NEGATIVE

## 2017-05-31 MED ORDER — METOPROLOL TARTRATE 50 MG PO TABS
25.0000 mg | ORAL_TABLET | Freq: Once | ORAL | Status: AC
  Administered 2017-05-31: 25 mg via ORAL
  Filled 2017-05-31: qty 1

## 2017-05-31 MED ORDER — LISINOPRIL 10 MG PO TABS
10.0000 mg | ORAL_TABLET | Freq: Once | ORAL | Status: AC
  Administered 2017-05-31: 10 mg via ORAL
  Filled 2017-05-31: qty 1

## 2017-05-31 MED ORDER — METOPROLOL SUCCINATE ER 25 MG PO TB24: 25.0000 mg | ORAL_TABLET | Freq: Every day | ORAL | 0 refills | Status: DC

## 2017-05-31 MED ORDER — FOSFOMYCIN TROMETHAMINE 3 G PO PACK
3.0000 g | PACK | Freq: Once | ORAL | Status: AC
  Administered 2017-06-01: 3 g via ORAL
  Filled 2017-05-31: qty 3

## 2017-05-31 MED ORDER — LISINOPRIL 10 MG PO TABS: 10.0000 mg | ORAL_TABLET | Freq: Every day | ORAL | 0 refills | Status: DC

## 2017-05-31 NOTE — ED Triage Notes (Signed)
Headache. States she ran out of BP medication 4 days ago.

## 2017-05-31 NOTE — Discharge Instructions (Signed)
You received the dose of antibiotics to treat your urinary tract infection. We sent a culture. Please follow-up with your primary care physician for further injury for blood pressure. We will give you the prescription for your blood pressure medicine for the next few weeks. If any symptoms change or worsen, please return to the nearest emergency department.

## 2017-05-31 NOTE — ED Provider Notes (Signed)
[facility]   CSN: [id number] Arrival date & time: 05/31/17  1853   By signing my name below, I, Clarisse Gouge, attest that this documentation has been prepared under the direction and in the presence of Arnitra Sokoloski, Canary Brim, MD. Electronically signed, Clarisse Gouge, ED Scribe. 05/31/17. 9:16 PM.   History   Chief Complaint Chief Complaint  Patient presents with  . Headache   The history is provided by the patient and medical records. No language interpreter was used.  Headache   This is a recurrent problem. The current episode started more than 2 days ago. The problem occurs constantly. The problem has been gradually worsening. The pain is at a severity of 6/10. The pain is moderate. The pain does not radiate. Pertinent negatives include no anorexia, no fever, no malaise/fatigue, no chest pressure, no syncope, no shortness of breath, no nausea and no vomiting. The treatment provided no relief.    [name] is a 41 y.o. female with h/o HTN presenting to the Emergency Department concerning headaches worsening gradually since onset 2 days ago. She describes 6/10 pressure like pain behind the eyes and states she normally gets headaches when her blood pressure is uncontrolled. She states she ate bananas attempting to bring down her blood pressure without relief. Pt states she takes 12.5 mg lisinopril and 25 mg metoprolol for HTN and she ran out of both ~3-4 days ago. She states she has been unable to get a new prescription for these medications. H/o frequent UTI's and kidney stones noted; she states she finished Levaquin for pyelonephritis 2-3 weeks ago and she notes lingering dysuria. No N/V/D, neck pain or stiffness, weakness, visual change or numbness. No other complaints at this time.   Past Medical History:  Diagnosis Date  . Asthma   . Bacterial vaginosis   . Candidal vaginitis   . Chronic UTI   . Complication of anesthesia   . Hypertension   .  Kidney stones   . Ovarian cyst   . Pyelonephritis, chronic     Patient Active Problem List   Diagnosis Date Noted  . Vomiting 04/21/2012  . Pyelonephritis 04/20/2012  . HTN (hypertension) 04/20/2012  . Asthma 04/20/2012    Past Surgical History:  Procedure Laterality Date  . CYSTOSCOPY  2001-2012   "total of 11; for chronic chronic pyelonephritis"  . LITHOTRIPSY  2000's  . nephrostomy    . TUBAL LIGATION  2002    OB History    No data available       Home Medications    Prior to Admission medications   Medication Sig Start Date End Date Taking? Authorizing Provider  acetaminophen (TYLENOL) 500 MG tablet Take 1,000 mg by mouth every 6 (six) hours as needed for moderate pain.    [provider]  albuterol (PROVENTIL HFA;VENTOLIN HFA) 108 (90 BASE) MCG/ACT inhaler Inhale 2 puffs into the lungs every 6 (six) hours as needed for wheezing. For wheezing    [provider]  cephALEXin (KEFLEX) 500 MG capsule Take 1 capsule (500 mg total) by mouth 2 (two) times daily. 07/17/15   Little, Ambrose Finland, MD  ciprofloxacin (CIPRO) 500 MG tablet Take 1 tablet (500 mg total) by mouth 2 (two) times daily. One po bid x 14 days 01/13/17   Doug Sou, MD  guaiFENesin (MUCINEX) 600 MG 12 hr tablet Take 1,200 mg by mouth 2 (two) times daily.    [provider]  hydrochlorothiazide (HYDRODIURIL) 12.5 MG tablet Take  1 tablet (12.5 mg total) by mouth daily. 10/14/14   Mirian MoGentry, Matthew, MD  metoprolol succinate (TOPROL-XL) 25 MG 24 hr tablet Take 1 tablet (25 mg total) by mouth daily. 10/14/14   Mirian MoGentry, Matthew, MD  metoprolol tartrate (LOPRESSOR) 25 MG tablet Take 12.5 mg by mouth 2 (two) times daily.    [provider]  promethazine (PHENERGAN) 12.5 MG tablet Take 1 tablet (12.5 mg total) by mouth every 6 (six) hours as needed for nausea or vomiting. 06/29/14   Cartner, Sharlet SalinaBenjamin, PA-C  promethazine-dextromethorphan (PROMETHAZINE-DM) 6.25-15 MG/5ML syrup Take 5  mLs by mouth 3 (three) times daily as needed for cough. 07/17/15   Jana Halfaylor, Nicholas A, MD  ranitidine (ZANTAC) 150 MG capsule Take 1 capsule (150 mg total) by mouth daily. Patient not taking: Reported on 07/17/2015 06/29/14   Joycie Peekartner, Benjamin, PA-C    Family History Family History  Problem Relation Age of Onset  . HIV/AIDS Mother   . Hypertension Father     Social History Social History  Substance Use Topics  . Smoking status: Current Every Day Smoker    Packs/day: 0.25    Years: 18.00    Types: Cigarettes  . Smokeless tobacco: Never Used  . Alcohol use No     Allergies   Flagyl [metronidazole hcl]; Ketorolac tromethamine; Metoclopramide hcl; Percocet [oxycodone-acetaminophen]; and Zofran [ondansetron hcl]   Review of Systems Review of Systems  Constitutional: Negative for fever and malaise/fatigue.  Respiratory: Negative for shortness of breath.   Cardiovascular: Negative for syncope.  Gastrointestinal: Negative for anorexia, nausea and vomiting.  Neurological: Positive for headaches.  All other systems reviewed and are negative.    Physical Exam Updated Vital Signs BP (!) 160/101 (BP Location: Right Arm)   Pulse 88   Temp 98.7 F (37.1 C) (Oral)   Resp 18   Ht 5\' 5"  (1.651 m)   Wt 219 lb (99.3 kg)   LMP 05/24/2017   SpO2 98%   BMI 36.44 kg/m   Physical Exam  Constitutional: She is oriented to person, place, and time. She appears well-developed and well-nourished. No distress.  HENT:  Head: Normocephalic and atraumatic.  Eyes: EOM are normal.  Neck: Normal range of motion.  Cardiovascular: Normal rate, regular rhythm and normal heart sounds.   Pulmonary/Chest: Effort normal and breath sounds normal.  Abdominal: Soft. She exhibits no distension. There is no tenderness.  Musculoskeletal: Normal range of motion.  Neurological: She is alert and oriented to person, place, and time.  Skin: Skin is warm and dry.  Psychiatric: She has a normal mood and affect.  Judgment normal.  Nursing note and vitals reviewed.    ED Treatments / Results  DIAGNOSTIC STUDIES: Oxygen Saturation is 98% on RA, NL by my interpretation.    COORDINATION OF CARE: 9:12 PM-Discussed next steps with pt. Pt verbalized understanding and is agreeable with the plan. Will order blood work and medications.   Labs (all labs ordered are listed, but only abnormal results are displayed) Labs Reviewed  COMPREHENSIVE METABOLIC PANEL - Abnormal; Notable for the following:       Result Value   Glucose, Bld 100 (*)    Creatinine, Ser 1.01 (*)    Total Protein 8.2 (*)    All other components within normal limits  URINALYSIS, ROUTINE W REFLEX MICROSCOPIC - Abnormal; Notable for the following:    APPearance CLOUDY (*)    Hgb urine dipstick LARGE (*)    Protein, ur 30 (*)    Leukocytes, UA LARGE (*)  All other components within normal limits  URINALYSIS, MICROSCOPIC (REFLEX) - Abnormal; Notable for the following:    Bacteria, UA MANY (*)    Squamous Epithelial / LPF 0-5 (*)    All other components within normal limits  URINE CULTURE  CBC WITH DIFFERENTIAL/PLATELET  PREGNANCY, URINE    EKG  EKG Interpretation None       Radiology No results found.  Procedures Procedures (including critical care time)  Medications Ordered in ED Medications  lisinopril (PRINIVIL,ZESTRIL) tablet 10 mg (10 mg Oral Given 05/31/17 2210)  metoprolol tartrate (LOPRESSOR) tablet 25 mg (25 mg Oral Given 05/31/17 2211)  fosfomycin (MONUROL) packet 3 g (3 g Oral Given 06/01/17 0012)     Initial Impression / Assessment and Plan / ED Course  I have reviewed the triage vital signs and the nursing notes.  Pertinent labs & imaging results that were available during my care of the patient were reviewed by me and considered in my medical decision making (see chart for details).     [name] is a 41 y.o. female With a past medical history significant for hypertension, asthma, and  recent urinary tract infection who presents with headache. Patient says that she has been out of her blood pressure medications of lisinopril and metoprolol for the last four days. She developed had a gradually over the last two days and feels similar to when she said elevated blood pressures. She denies a sudden onset for thunderclap headache. She denies any neurologic deficits. She denies any vision changes, nausea, or vomiting. She denies fevers or chills. She denies any trauma.  She does report mild dysuria and she completed antibiotics several weeks ago for UTI. She denies any other symptoms.   On exam, abdomen nontender. Chest nontender. No focal neurologic deficits. Patient has blood pressure in the 190s on arrival systolic.  Based on description of symptoms, do not feel patient has intracranial bleed as cause of headache. Suspect blood pressure causing headache. Patient given home blood pressure medications. Given dysuria, labs and urinalysis will be obtained to look for UTI as source of worsened hypertension in the setting of blood pressure medication shortage.  Patient found to have UTI. Given patient's report that she has taken several different antibiotics, fosfomycin will be given. Do not feel patient has following Friday's given lactic fevers, flank pain, or nausea and vomiting. Patient tolerated medication without difficulty.  Patients headache resolved after blood pressure improved with her home medications.  Patient other lab testing reassuring. Do not feel patient needs admission. Patient given prescription for home blood pressure medications and instructions to follow up with her PCP. Patient voiced understanding of the plan of care is was return precautions. Patient had no or concerns and was discharged in good condition with resolution of presenting headache.     Final Clinical Impressions(s) / ED Diagnoses   Final diagnoses:  Hypertension, unspecified type  Bad headache    Acute cystitis without hematuria    New Prescriptions Discharge Medication List as of 05/31/2017 11:51 PM    START taking these medications   Details  lisinopril (PRINIVIL,ZESTRIL) 10 MG tablet Take 1 tablet (10 mg total) by mouth daily., Starting Mon 05/31/2017, Print    !! metoprolol succinate (TOPROL-XL) 25 MG 24 hr tablet Take 1 tablet (25 mg total) by mouth daily., Starting Mon 05/31/2017, Print     !! - Potential duplicate medications found. Please discuss with provider.     I personally performed the services described in  this documentation, which was scribed in my presence. The recorded information has been reviewed and is accurate.  Clinical Impression: 1. Hypertension, unspecified type   2. Bad headache   3. Acute cystitis without hematuria     Disposition: Discharge  Condition: Good  I have discussed the results, Dx and Tx plan with the pt(& family if present). He/she/they expressed understanding and agree(s) with the plan. Discharge instructions discussed at great length. Strict return precautions discussed and pt &/or family have verbalized understanding of the instructions. No further questions at time of discharge.    Discharge Medication List as of 05/31/2017 11:51 PM    START taking these medications   Details  lisinopril (PRINIVIL,ZESTRIL) 10 MG tablet Take 1 tablet (10 mg total) by mouth daily., Starting Mon 05/31/2017, Print    !! metoprolol succinate (TOPROL-XL) 25 MG 24 hr tablet Take 1 tablet (25 mg total) by mouth daily., Starting Mon 05/31/2017, Print     !! - Potential duplicate medications found. Please discuss with provider.      Follow Up: [facility] [address] [place] [address] [phone] Schedule an appointment as soon as possible for a visit    [facility] [place] EM[facility]PART[facility] [address] [phone] mc [address] [place] [address] [phone]  If  symptoms worsen     Suzzane Quilter, Canary Brim, MD 06/01/17 1212

## 2017-06-02 LAB — URINE CULTURE

## 2017-09-12 ENCOUNTER — Emergency Department (HOSPITAL_BASED_OUTPATIENT_CLINIC_OR_DEPARTMENT_OTHER)
Admission: EM | Admit: 2017-09-12 | Discharge: 2017-09-12 | Disposition: A | Discharge status: Home / Self Care | Payer: 59 | Attending: Emergency Medicine | Admitting: Emergency Medicine

## 2017-09-12 ENCOUNTER — Other Ambulatory Visit: Payer: Self-pay

## 2017-09-12 ENCOUNTER — Encounter (HOSPITAL_BASED_OUTPATIENT_CLINIC_OR_DEPARTMENT_OTHER): Payer: Self-pay | Admitting: Emergency Medicine

## 2017-09-12 DIAGNOSIS — Z79899 Other long term (current) drug therapy: Secondary | ICD-10-CM | POA: Insufficient documentation

## 2017-09-12 DIAGNOSIS — F17210 Nicotine dependence, cigarettes, uncomplicated: Secondary | ICD-10-CM | POA: Insufficient documentation

## 2017-09-12 DIAGNOSIS — J45909 Unspecified asthma, uncomplicated: Secondary | ICD-10-CM | POA: Insufficient documentation

## 2017-09-12 DIAGNOSIS — I10 Essential (primary) hypertension: Secondary | ICD-10-CM | POA: Insufficient documentation

## 2017-09-12 DIAGNOSIS — N10 Acute tubulo-interstitial nephritis: Secondary | ICD-10-CM | POA: Insufficient documentation

## 2017-09-12 DIAGNOSIS — N12 Tubulo-interstitial nephritis, not specified as acute or chronic: Secondary | ICD-10-CM

## 2017-09-12 LAB — CBC WITH DIFFERENTIAL/PLATELET
Basophils Absolute: 0 10*3/uL (ref 0.0–0.1)
Basophils Relative: 0 %
Eosinophils Absolute: 0.2 10*3/uL (ref 0.0–0.7)
Eosinophils Relative: 3 %
HCT: 36.6 % (ref 36.0–46.0)
Hemoglobin: 12.3 g/dL (ref 12.0–15.0)
Lymphocytes Relative: 35 %
Lymphs Abs: 2.1 10*3/uL (ref 0.7–4.0)
MCH: 31.9 pg (ref 26.0–34.0)
MCHC: 33.6 g/dL (ref 30.0–36.0)
MCV: 95.1 fL (ref 78.0–100.0)
Monocytes Absolute: 0.4 10*3/uL (ref 0.1–1.0)
Monocytes Relative: 7 %
Neutro Abs: 3.3 10*3/uL (ref 1.7–7.7)
Neutrophils Relative %: 55 %
Platelets: 287 10*3/uL (ref 150–400)
RBC: 3.85 MIL/uL — ABNORMAL LOW (ref 3.87–5.11)
RDW: 13 % (ref 11.5–15.5)
WBC: 6 10*3/uL (ref 4.0–10.5)

## 2017-09-12 LAB — URINALYSIS, ROUTINE W REFLEX MICROSCOPIC
Bilirubin Urine: NEGATIVE
Glucose, UA: NEGATIVE mg/dL
KETONES UR: NEGATIVE mg/dL
NITRITE: NEGATIVE
PH: 6 (ref 5.0–8.0)
PROTEIN: NEGATIVE mg/dL
Specific Gravity, Urine: >1.03 — ABNORMAL HIGH (ref 1.005–1.030)

## 2017-09-12 LAB — BASIC METABOLIC PANEL
Anion gap: 5 (ref 5–15)
BUN: 10 mg/dL (ref 6–20)
CO2: 23 mmol/L (ref 22–32)
Calcium: 8.5 mg/dL — ABNORMAL LOW (ref 8.9–10.3)
Chloride: 106 mmol/L (ref 101–111)
Creatinine, Ser: 0.81 mg/dL (ref 0.44–1.00)
GFR calc Af Amer: >60 mL/min (ref >60)
GFR calc non Af Amer: >60 mL/min (ref >60)
Glucose, Bld: 99 mg/dL (ref 65–99)
Potassium: 3.9 mmol/L (ref 3.5–5.1)
Sodium: 134 mmol/L — ABNORMAL LOW (ref 135–145)

## 2017-09-12 LAB — URINALYSIS, MICROSCOPIC (REFLEX)

## 2017-09-12 LAB — PREGNANCY, URINE: Preg Test, Ur: NEGATIVE

## 2017-09-12 MED ORDER — LIDOCAINE HCL (PF) 1 % IJ SOLN
INTRAMUSCULAR | Status: AC
  Filled 2017-09-12: qty 5

## 2017-09-12 MED ORDER — PROMETHAZINE HCL 25 MG PO TABS: 25.0000 mg | ORAL_TABLET | Freq: Four times a day (QID) | ORAL | 0 refills | Status: DC | PRN

## 2017-09-12 MED ORDER — PROMETHAZINE HCL 25 MG PO TABS
25.0000 mg | ORAL_TABLET | Freq: Once | ORAL | Status: AC
  Administered 2017-09-12: 25 mg via ORAL
  Filled 2017-09-12: qty 1

## 2017-09-12 MED ORDER — CIPROFLOXACIN HCL 500 MG PO TABS: 500.0000 mg | ORAL_TABLET | Freq: Two times a day (BID) | ORAL | 0 refills | Status: DC

## 2017-09-12 MED ORDER — CEFTRIAXONE SODIUM 1 G IJ SOLR
1.0000 g | Freq: Once | INTRAMUSCULAR | Status: AC
  Administered 2017-09-12: 1 g via INTRAMUSCULAR
  Filled 2017-09-12: qty 10

## 2017-09-12 MED ORDER — CEPHALEXIN 500 MG PO CAPS: 500.0000 mg | ORAL_CAPSULE | Freq: Three times a day (TID) | ORAL | 0 refills | Status: DC

## 2017-09-12 MED ORDER — DEXTROSE 5 % IV SOLN: 1.0000 g | Freq: Once | INTRAVENOUS | Status: DC

## 2017-09-12 MED ORDER — FLUCONAZOLE 150 MG PO TABS: 150.0000 mg | ORAL_TABLET | Freq: Every day | ORAL | 0 refills | Status: DC

## 2017-09-12 NOTE — ED Notes (Signed)
Rocephin 1 gram mixed with 1% lidocaine, 2.21ml given to RVG.

## 2017-09-12 NOTE — Discharge Instructions (Signed)
Please read attached information. If you experience any new or worsening signs or symptoms please return to the emergency room for evaluation. Please follow-up with your primary care provider or specialist as discussed. Please use medication prescribed only as directed and discontinue taking if you have any concerning signs or symptoms.   °

## 2017-09-12 NOTE — ED Notes (Signed)
Patient reports that she is a hard stick. [facility], talked with PA  - at this time oral trial and then see if IV is needed

## 2017-09-12 NOTE — ED Triage Notes (Addendum)
Dysuria x 4 days, with nausea

## 2017-09-12 NOTE — ED Provider Notes (Signed)
[facility] EMERGENCY DEPARTMENT Provider Note   CSN: [id number] Arrival date & time: 09/12/17  0831   History   Chief Complaint Chief Complaint  Patient presents with  . Dysuria    HPI [name] is a 41 y.o. female.  HPI      [age] YOF presents today with complaints of dysuria and nausea.  Patient most recently admitted to [facility] on 08/12/2017 secondary to suspected pyelonephritis.  Patient does have a history of kidney stones in the past, but notes that her discomfort is unlike previous kidney stones.  Patient had a CT scan at that time showing no acute intra-abdominal abdominal process.  Patient had blood cultures that were negative, urine culture revealed enterococcus.  Patient was receiving Rocephin at that time switched to Cipro.  She was discharged with 10-day course of Cipro.  She notes symptoms improved but again recurred approximately 4 days ago.  She notes dysuria, suprapubic pain and vague discomfort in her right flank.  This dry heaving over the last several days with episodes of vomiting.  She reports taking promethazine at home without relief of symptoms.  Patient denies any vaginal bleeding or discharge.   Past Medical History:  Diagnosis Date  . Asthma   . Bacterial vaginosis   . Candidal vaginitis   . Chronic UTI   . Complication of anesthesia   . Hypertension   . Kidney stones   . Ovarian cyst   . Pyelonephritis, chronic     Patient Active Problem List   Diagnosis Date Noted  . Vomiting 04/21/2012  . Pyelonephritis 04/20/2012  . HTN (hypertension) 04/20/2012  . Asthma 04/20/2012    Past Surgical History:  Procedure Laterality Date  . CYSTOSCOPY  2001-2012   "total of 11; for chronic chronic pyelonephritis"  . LITHOTRIPSY  2000's  . nephrostomy    . TUBAL LIGATION  2002    OB History    No data available       Home Medications    Prior to Admission medications   Medication Sig Start Date End Date Taking?  Authorizing Provider  albuterol (PROVENTIL HFA;VENTOLIN HFA) 108 (90 BASE) MCG/ACT inhaler Inhale 2 puffs into the lungs every 6 (six) hours as needed for wheezing. For wheezing   Yes [provider]  metoprolol tartrate (LOPRESSOR) 25 MG tablet Take 12.5 mg by mouth 2 (two) times daily.   Yes [provider]  acetaminophen (TYLENOL) 500 MG tablet Take 1,000 mg by mouth every 6 (six) hours as needed for moderate pain.    [provider]  cephALEXin (KEFLEX) 500 MG capsule Take 1 capsule (500 mg total) by mouth 3 (three) times daily. 09/12/17   Saudia Smyser, Tinnie GensJeffrey, PA-C  fluconazole (DIFLUCAN) 150 MG tablet Take 1 tablet (150 mg total) by mouth daily. Please take one tab now and one after completing antibiotic therapy 09/12/17   Lakasha Mcfall, Tinnie GensJeffrey, PA-C  guaiFENesin (MUCINEX) 600 MG 12 hr tablet Take 1,200 mg by mouth 2 (two) times daily.    [provider]  hydrochlorothiazide (HYDRODIURIL) 12.5 MG tablet Take 1 tablet (12.5 mg total) by mouth daily. 10/14/14   Mirian MoGentry, Matthew, MD  lisinopril (PRINIVIL,ZESTRIL) 10 MG tablet Take 1 tablet (10 mg total) by mouth daily. 05/31/17   Tegeler, Canary Brimhristopher J, MD  metoprolol succinate (TOPROL-XL) 25 MG 24 hr tablet Take 1 tablet (25 mg total) by mouth daily. 10/14/14   Mirian MoGentry, Matthew, MD  metoprolol succinate (TOPROL-XL) 25 MG 24 hr tablet Take 1  tablet (25 mg total) by mouth daily. 05/31/17   Tegeler, Canary Brim, MD  promethazine (PHENERGAN) 25 MG tablet Take 1 tablet (25 mg total) by mouth every 6 (six) hours as needed for nausea or vomiting. 09/12/17   Ayla Dunigan, Tinnie Gens, PA-C  promethazine-dextromethorphan (PROMETHAZINE-DM) 6.25-15 MG/5ML syrup Take 5 mLs by mouth 3 (three) times daily as needed for cough. 07/17/15   Jana Half, MD  ranitidine (ZANTAC) 150 MG capsule Take 1 capsule (150 mg total) by mouth daily. Patient not taking: Reported on 07/17/2015 06/29/14   Joycie Peek, PA-C    Family History Family History   Problem Relation Age of Onset  . HIV/AIDS Mother   . Hypertension Father     Social History Social History   Tobacco Use  . Smoking status: Current Every Day Smoker    Packs/day: 0.25    Years: 18.00    Pack years: 4.50    Types: Cigarettes  . Smokeless tobacco: Never Used  Substance Use Topics  . Alcohol use: No  . Drug use: No     Allergies   Flagyl [metronidazole hcl]; Ketorolac tromethamine; Metoclopramide hcl; Percocet [oxycodone-acetaminophen]; and Zofran [ondansetron hcl]   Review of Systems Review of Systems  All other systems reviewed and are negative.  Physical Exam Updated Vital Signs BP (!) 142/101 (BP Location: Left Arm)   Pulse 86   Temp 98.7 F (37.1 C) (Oral)   Resp 18   Ht 5\' 4"  (1.626 m)   Wt 98.9 kg (218 lb)   LMP 09/03/2017   SpO2 100%   BMI 37.42 kg/m   Physical Exam  Constitutional: She is oriented to person, place, and time. She appears well-developed and well-nourished.  HENT:  Head: Normocephalic and atraumatic.  Eyes: Conjunctivae are normal. Pupils are equal, round, and reactive to light. Right eye exhibits no discharge. Left eye exhibits no discharge. No scleral icterus.  Neck: Normal range of motion. No JVD present. No tracheal deviation present.  Pulmonary/Chest: Effort normal. No stridor.  Abdominal: Soft.  Minor suprapubic tenderness to palpation-no CVA tenderness  Neurological: She is alert and oriented to person, place, and time. Coordination normal.  Psychiatric: She has a normal mood and affect. Her behavior is normal. Judgment and thought content normal.  Nursing note and vitals reviewed.    ED Treatments / Results  Labs (all labs ordered are listed, but only abnormal results are displayed) Labs Reviewed  URINALYSIS, ROUTINE W REFLEX MICROSCOPIC - Abnormal; Notable for the following components:      Result Value   APPearance CLOUDY (*)    Specific Gravity, Urine >1.030 (*)    Hgb urine dipstick TRACE (*)     Leukocytes, UA MODERATE (*)    All other components within normal limits  URINALYSIS, MICROSCOPIC (REFLEX) - Abnormal; Notable for the following components:   Bacteria, UA MANY (*)    Squamous Epithelial / LPF 0-5 (*)    All other components within normal limits  CBC WITH DIFFERENTIAL/PLATELET - Abnormal; Notable for the following components:   RBC 3.85 (*)    All other components within normal limits  BASIC METABOLIC PANEL - Abnormal; Notable for the following components:   Sodium 134 (*)    Calcium 8.5 (*)    All other components within normal limits  URINE CULTURE  PREGNANCY, URINE    EKG  EKG Interpretation None       Radiology No results found.  Procedures Procedures (including critical care time)  Medications Ordered in ED  Medications  promethazine (PHENERGAN) tablet 25 mg (25 mg Oral Given 09/12/17 0943)  cefTRIAXone (ROCEPHIN) injection 1 g (1 g Intramuscular Given 09/12/17 1138)     Initial Impression / Assessment and Plan / ED Course  I have reviewed the triage vital signs and the nursing notes.  Pertinent labs & imaging results that were available during my care of the patient were reviewed by me and considered in my medical decision making (see chart for details).      Final Clinical Impressions(s) / ED Diagnoses   Final diagnoses:  Pyelonephritis    Labs: CBC, BMP and urinalysis, urine pregnancy  Imaging:  Consults:  Therapeutics: Promethazine, ceftriaxone  Discharge Meds: Promethazine,  Assessment/Plan: 41 year old female presents today with likely pyelonephritis.  She is well-appearing in no acute distress, afebrile with no signs of tachycardia.  Patient recently treated for the same.  Patient has no signs or symptoms consistent with severe pyelonephritis, she was given a dose of Rocephin here, urine culture will be sent off.  She is tolerating p.o. here.  Patient also has what appears to be yeast in her urine, likely secondary to recent  antibiotics.  She will be given 2 doses of fluconazole 1 now, one after the antibiotic treatment.  I feel patient is stable for outpatient management, she is encouraged to follow-up with her primary care provider in 2 days for reassessment return to the emergency room if it worsens.  Patient verbalized understanding and agreement to today's plan had no further questions or concerns at time of discharge.    ED Discharge Orders        Ordered    ciprofloxacin (CIPRO) 500 MG tablet  Every 12 hours,   Status:  Discontinued     09/12/17 1053    promethazine (PHENERGAN) 25 MG tablet  Every 6 hours PRN,   Status:  Discontinued     09/12/17 1053    fluconazole (DIFLUCAN) 150 MG tablet  Daily     09/12/17 1053    cephALEXin (KEFLEX) 500 MG capsule  3 times daily     09/12/17 1103    promethazine (PHENERGAN) 25 MG tablet  Every 6 hours PRN     09/12/17 1103       Eyvonne MechanicHedges, Sabella Traore, PA-C 09/12/17 1715    Gwyneth SproutPlunkett, Whitney, MD 09/12/17 2034

## 2017-09-12 NOTE — ED Notes (Signed)
Patient called out states that she is dry heaving. At this time noting has come up

## 2017-09-13 LAB — URINE CULTURE

## 2017-09-14 ENCOUNTER — Inpatient Hospital Stay (HOSPITAL_BASED_OUTPATIENT_CLINIC_OR_DEPARTMENT_OTHER)
Admission: EM | Admit: 2017-09-14 | Discharge: 2017-09-15 | DRG: 700 | Disposition: A | Discharge status: Home / Self Care | Payer: Self-pay | Attending: Family Medicine | Admitting: Family Medicine

## 2017-09-14 ENCOUNTER — Other Ambulatory Visit: Payer: Self-pay

## 2017-09-14 ENCOUNTER — Emergency Department (HOSPITAL_BASED_OUTPATIENT_CLINIC_OR_DEPARTMENT_OTHER): Payer: Self-pay

## 2017-09-14 ENCOUNTER — Encounter (HOSPITAL_COMMUNITY): Payer: Self-pay | Admitting: *Deleted

## 2017-09-14 DIAGNOSIS — R112 Nausea with vomiting, unspecified: Secondary | ICD-10-CM

## 2017-09-14 DIAGNOSIS — Z888 Allergy status to other drugs, medicaments and biological substances status: Secondary | ICD-10-CM

## 2017-09-14 DIAGNOSIS — I10 Essential (primary) hypertension: Secondary | ICD-10-CM | POA: Diagnosis present

## 2017-09-14 DIAGNOSIS — Z6836 Body mass index (BMI) 36.0-36.9, adult: Secondary | ICD-10-CM

## 2017-09-14 DIAGNOSIS — N12 Tubulo-interstitial nephritis, not specified as acute or chronic: Secondary | ICD-10-CM | POA: Diagnosis present

## 2017-09-14 DIAGNOSIS — E669 Obesity, unspecified: Secondary | ICD-10-CM | POA: Diagnosis present

## 2017-09-14 DIAGNOSIS — F17210 Nicotine dependence, cigarettes, uncomplicated: Secondary | ICD-10-CM | POA: Diagnosis present

## 2017-09-14 DIAGNOSIS — Z87442 Personal history of urinary calculi: Secondary | ICD-10-CM

## 2017-09-14 DIAGNOSIS — Z79899 Other long term (current) drug therapy: Secondary | ICD-10-CM

## 2017-09-14 DIAGNOSIS — J45909 Unspecified asthma, uncomplicated: Secondary | ICD-10-CM | POA: Diagnosis present

## 2017-09-14 DIAGNOSIS — N2889 Other specified disorders of kidney and ureter: Principal | ICD-10-CM | POA: Diagnosis present

## 2017-09-14 LAB — URINALYSIS, ROUTINE W REFLEX MICROSCOPIC
Bilirubin Urine: NEGATIVE
GLUCOSE, UA: NEGATIVE mg/dL
Ketones, ur: NEGATIVE mg/dL
NITRITE: NEGATIVE
PH: 6 (ref 5.0–8.0)
Protein, ur: 30 mg/dL — AB
Specific Gravity, Urine: >1.03 — ABNORMAL HIGH (ref 1.005–1.030)

## 2017-09-14 LAB — COMPREHENSIVE METABOLIC PANEL
ALBUMIN: 3.9 g/dL (ref 3.5–5.0)
ALT: 14 U/L (ref 14–54)
ANION GAP: 5 (ref 5–15)
AST: 19 U/L (ref 15–41)
Alkaline Phosphatase: 69 U/L (ref 38–126)
BUN: 11 mg/dL (ref 6–20)
CHLORIDE: 105 mmol/L (ref 101–111)
CO2: 24 mmol/L (ref 22–32)
Calcium: 8.5 mg/dL — ABNORMAL LOW (ref 8.9–10.3)
Creatinine, Ser: 0.83 mg/dL (ref 0.44–1.00)
GFR calc non Af Amer: >60 mL/min (ref >60)
GLUCOSE: 101 mg/dL — AB (ref 65–99)
POTASSIUM: 4.1 mmol/L (ref 3.5–5.1)
SODIUM: 134 mmol/L — AB (ref 135–145)
Total Bilirubin: 0.6 mg/dL (ref 0.3–1.2)
Total Protein: 7.5 g/dL (ref 6.5–8.1)

## 2017-09-14 LAB — URINALYSIS, MICROSCOPIC (REFLEX)

## 2017-09-14 LAB — CBC WITH DIFFERENTIAL/PLATELET
BASOS ABS: 0 10*3/uL (ref 0.0–0.1)
Basophils Relative: 0 %
EOS ABS: 0.1 10*3/uL (ref 0.0–0.7)
Eosinophils Relative: 2 %
HCT: 39.8 % (ref 36.0–46.0)
HEMOGLOBIN: 13.1 g/dL (ref 12.0–15.0)
LYMPHS ABS: 2.1 10*3/uL (ref 0.7–4.0)
LYMPHS PCT: 43 %
MCH: 31.3 pg (ref 26.0–34.0)
MCHC: 32.9 g/dL (ref 30.0–36.0)
MCV: 95.2 fL (ref 78.0–100.0)
MONO ABS: 0.4 10*3/uL (ref 0.1–1.0)
Monocytes Relative: 8 %
NEUTROS ABS: 2.3 10*3/uL (ref 1.7–7.7)
Neutrophils Relative %: 47 %
PLATELETS: 305 10*3/uL (ref 150–400)
RBC: 4.18 MIL/uL (ref 3.87–5.11)
RDW: 12.9 % (ref 11.5–15.5)
WBC: 4.9 10*3/uL (ref 4.0–10.5)

## 2017-09-14 LAB — PREGNANCY, URINE: Preg Test, Ur: NEGATIVE

## 2017-09-14 MED ORDER — DEXTROSE 5 % IV SOLN
1.0000 g | INTRAVENOUS | Status: DC
  Administered 2017-09-15: 1 g via INTRAVENOUS
  Filled 2017-09-14: qty 10

## 2017-09-14 MED ORDER — DEXTROSE 5 % IV SOLN
1.0000 g | Freq: Once | INTRAVENOUS | Status: AC
  Administered 2017-09-14: 1 g via INTRAVENOUS
  Filled 2017-09-14: qty 10

## 2017-09-14 MED ORDER — FAMOTIDINE 20 MG PO TABS
10.0000 mg | ORAL_TABLET | Freq: Two times a day (BID) | ORAL | Status: DC
  Administered 2017-09-14 – 2017-09-15 (×2): 10 mg via ORAL
  Filled 2017-09-14 (×3): qty 1

## 2017-09-14 MED ORDER — SODIUM CHLORIDE 0.9 % IV SOLN
INTRAVENOUS | Status: DC
  Administered 2017-09-14 – 2017-09-15 (×3): via INTRAVENOUS

## 2017-09-14 MED ORDER — MORPHINE SULFATE (PF) 4 MG/ML IV SOLN
2.0000 mg | INTRAVENOUS | Status: DC | PRN
  Administered 2017-09-14 – 2017-09-15 (×5): 2 mg via INTRAVENOUS
  Filled 2017-09-14 (×5): qty 1

## 2017-09-14 MED ORDER — ACETAMINOPHEN 325 MG PO TABS: 650.0000 mg | ORAL_TABLET | Freq: Four times a day (QID) | ORAL | Status: DC | PRN

## 2017-09-14 MED ORDER — ENOXAPARIN SODIUM 40 MG/0.4ML ~~LOC~~ SOLN
40.0000 mg | SUBCUTANEOUS | Status: DC
  Administered 2017-09-14: 40 mg via SUBCUTANEOUS
  Filled 2017-09-14: qty 0.4

## 2017-09-14 MED ORDER — METOPROLOL SUCCINATE ER 25 MG PO TB24
25.0000 mg | ORAL_TABLET | Freq: Every day | ORAL | Status: DC
  Administered 2017-09-14 – 2017-09-15 (×2): 25 mg via ORAL
  Filled 2017-09-14 (×2): qty 1

## 2017-09-14 MED ORDER — PROMETHAZINE HCL 25 MG/ML IJ SOLN
12.5000 mg | Freq: Four times a day (QID) | INTRAMUSCULAR | Status: DC | PRN
  Administered 2017-09-14 – 2017-09-15 (×3): 12.5 mg via INTRAVENOUS
  Filled 2017-09-14 (×3): qty 1

## 2017-09-14 MED ORDER — NICOTINE 7 MG/24HR TD PT24
7.0000 mg | MEDICATED_PATCH | Freq: Every day | TRANSDERMAL | Status: DC
  Administered 2017-09-14 – 2017-09-15 (×2): 7 mg via TRANSDERMAL
  Filled 2017-09-14 (×2): qty 1

## 2017-09-14 MED ORDER — ALBUTEROL SULFATE (2.5 MG/3ML) 0.083% IN NEBU: 3.0000 mL | INHALATION_SOLUTION | Freq: Four times a day (QID) | RESPIRATORY_TRACT | Status: DC | PRN

## 2017-09-14 MED ORDER — PROMETHAZINE HCL 25 MG/ML IJ SOLN
12.5000 mg | Freq: Once | INTRAMUSCULAR | Status: AC
  Administered 2017-09-14: 12.5 mg via INTRAVENOUS
  Filled 2017-09-14: qty 1

## 2017-09-14 MED ORDER — ZOLPIDEM TARTRATE 5 MG PO TABS
5.0000 mg | ORAL_TABLET | Freq: Every evening | ORAL | Status: DC | PRN
  Administered 2017-09-14: 5 mg via ORAL
  Filled 2017-09-14: qty 1

## 2017-09-14 MED ORDER — ACETAMINOPHEN 650 MG RE SUPP: 650.0000 mg | Freq: Four times a day (QID) | RECTAL | Status: DC | PRN

## 2017-09-14 MED ORDER — SODIUM CHLORIDE 0.9 % IV BOLUS (SEPSIS)
1000.0000 mL | Freq: Once | INTRAVENOUS | Status: AC
  Administered 2017-09-14: 1000 mL via INTRAVENOUS

## 2017-09-14 MED ORDER — SENNA 8.6 MG PO TABS
1.0000 | ORAL_TABLET | Freq: Every day | ORAL | Status: DC
  Filled 2017-09-14 (×2): qty 1

## 2017-09-14 NOTE — ED Triage Notes (Signed)
Pt reports she was seen two days ago for dysuria and emesis; diagnosed with pyelonephritis; given prescriptions for keflex, diflucan, and phenergan. Pt states she has been throwing up since then and cannot keep medications down. Pt states she is still having dysuria, flank pain, abd pain, and N/V. Pain rated 7/10.

## 2017-09-14 NOTE — ED Notes (Signed)
ED Provider at bedside. 

## 2017-09-14 NOTE — ED Notes (Signed)
Pt to CT in bed.

## 2017-09-14 NOTE — Progress Notes (Signed)
Patient arrived on unit from [facility].  Admitting and Admission RN notified.  No family at bedside.

## 2017-09-14 NOTE — Progress Notes (Signed)
Patient requesting something other than Tylenol for pain.  Patient states Tylenol does not work. Dr. Ronalee BeltsBhandari notified via text page.

## 2017-09-14 NOTE — ED Notes (Signed)
2 unsuccessful attempts at IV access; charge RN notified and will try shortly. MD also notified.

## 2017-09-14 NOTE — H&P (Signed)
History and Physical    [name] UJW:[id number] DOB: 1976/03/24 DOA: 09/14/2017  PCP: Patient, No Pcp Per  Patient coming from:Home through [facility]  Chief Complaint: Nausea vomiting and dysuria  HPI: [name] is a 41 y.o. female with medical history significant of hypertension, kidney stones, seen in the ER 2 days prior to admission when patient was discharged home with Keflex for the treatment of pyelonephritis presented with severe nausea vomiting and not able to take medication orally.  Patient reported chills, no improvement dysuria.  Discomfort on the back.  She tried Phenergan for the management of nausea but did not improve.  She was seen in the ER again today when patient was treated with IV ceftriaxone.  Admitted for further evaluation.  Patient denies headache, dizziness, chest pain, shortness of breath, cough, diarrhea or constipation.  No abdominal pain.  ED Course: Given IV antibiotics.  A urine culture was sent.  [facility].  Review of Systems: As per HPI otherwise 10 point review of systems negative.    Past Medical History:  Diagnosis Date  . Asthma   . Bacterial vaginosis   . Candidal vaginitis   . Chronic UTI   . Complication of anesthesia   . Hypertension   . Kidney stones   . Ovarian cyst   . Pyelonephritis, chronic     Past Surgical History:  Procedure Laterality Date  . CYSTOSCOPY  2001-2012   "total of 11; for chronic chronic pyelonephritis"  . LITHOTRIPSY  2000's  . nephrostomy    . TUBAL LIGATION  2002    Social history: Reports that she has been smoking cigarettes.  She has a 4.50 pack-year smoking history. she has never used smokeless tobacco. She reports that she does not drink alcohol or use drugs.  Allergies  Allergen Reactions  . Flagyl [Metronidazole Hcl] Hives  . Ketorolac Tromethamine Hives  . Metoclopramide Hcl Other (See Comments)    Facial spasm; "they thought I was  having a stroke"  . Percocet [Oxycodone-Acetaminophen] Hives and Itching  . Barium Sulfate Itching  . Zofran [Ondansetron Hcl] Hives    Family History  Problem Relation Age of Onset  . HIV/AIDS Mother   . Hypertension Father      Prior to Admission medications   Medication Sig Start Date End Date Taking? Authorizing Provider  acetaminophen (TYLENOL) 500 MG tablet Take 2,000 mg by mouth every 6 (six) hours as needed for moderate pain (PAIN).    Yes [provider]  albuterol (PROAIR HFA) 108 (90 Base) MCG/ACT inhaler Inhale 2 puffs into the lungs 4 (four) times daily as needed for wheezing or shortness of breath. 01/26/12  Yes [provider]  albuterol (PROVENTIL HFA;VENTOLIN HFA) 108 (90 BASE) MCG/ACT inhaler Inhale 2 puffs into the lungs every 6 (six) hours as needed for wheezing. For wheezing   Yes [provider]  cephALEXin (KEFLEX) 500 MG capsule Take 1 capsule (500 mg total) by mouth 3 (three) times daily. 09/12/17  Yes Hedges, Tinnie GensJeffrey, PA-C  Diphenhydramine-APAP, sleep, (TYLENOL PM EXTRA STRENGTH PO) Take 3 tablets by mouth at bedtime as needed (SLEEP/PAIN).   Yes [provider]  fluconazole (DIFLUCAN) 150 MG tablet Take 1 tablet (150 mg total) by mouth daily. Please take one tab now and one after completing antibiotic therapy 09/12/17  Yes Hedges, Tinnie GensJeffrey, PA-C  lisinopril-hydrochlorothiazide (PRINZIDE,ZESTORETIC) 20-12.5 MG tablet Take 1 tablet by mouth daily. 09/11/17  Yes [provider]  metoprolol succinate (TOPROL-XL) 25 MG 24 hr tablet Take 1 tablet (25 mg total) by mouth daily. 05/31/17  Yes Tegeler, Canary Brim, MD  promethazine (PHENERGAN) 25 MG tablet Take 1 tablet (25 mg total) by mouth every 6 (six) hours as needed for nausea or vomiting. 09/12/17  Yes Hedges, Tinnie Gens, PA-C  ranitidine (ZANTAC) 150 MG capsule Take 1 capsule (150 mg total) by mouth daily. 06/29/14  Yes Cartner, Sharlet Salina, PA-C  guaiFENesin (MUCINEX) 600 MG 12 hr  tablet Take 1,200 mg by mouth 2 (two) times daily.    [provider]  hydrochlorothiazide (HYDRODIURIL) 12.5 MG tablet Take 1 tablet (12.5 mg total) by mouth daily. Patient not taking: Reported on 09/14/2017 10/14/14   Mirian Mo, MD  lisinopril (PRINIVIL,ZESTRIL) 10 MG tablet Take 1 tablet (10 mg total) by mouth daily. Patient not taking: Reported on 09/14/2017 05/31/17   Tegeler, Canary Brim, MD  metoprolol succinate (TOPROL-XL) 25 MG 24 hr tablet Take 1 tablet (25 mg total) by mouth daily. Patient not taking: Reported on 09/14/2017 10/14/14   Mirian Mo, MD  promethazine-dextromethorphan (PROMETHAZINE-DM) 6.25-15 MG/5ML syrup Take 5 mLs by mouth 3 (three) times daily as needed for cough. Patient not taking: Reported on 09/14/2017 07/17/15   Jana Half, MD    Physical Exam: Vitals:   09/14/17 1246 09/14/17 1412 09/14/17 1455 09/14/17 1552  BP: 116/76 (!) 120/92 (!) 129/101 (!) 121/91  Pulse: 71 72 72 75  Resp: 18 16 18 18   Temp:  97.7 F (36.5 C)    TempSrc:  Oral    SpO2: 99% 97% 98% 99%  Weight:      Height:          Constitutional: NAD, calm, comfortable Vitals:   09/14/17 1246 09/14/17 1412 09/14/17 1455 09/14/17 1552  BP: 116/76 (!) 120/92 (!) 129/101 (!) 121/91  Pulse: 71 72 72 75  Resp: 18 16 18 18   Temp:  97.7 F (36.5 C)    TempSrc:  Oral    SpO2: 99% 97% 98% 99%  Weight:      Height:       Eyes: PERRL, lids and conjunctivae normal ENMT: Mucous membranes are moist. Normal dentition.  Neck: normal, supple Respiratory: clear to auscultation bilaterally, no wheezing, no crackles. Normal respiratory effort. No accessory muscle use.  Cardiovascular: Regular rate and rhythm, S1S2 Normal. no murmurs / rubs / gallops. No extremity edema.   Abdomen: Soft, Non-tender, non-distended. Bowel sounds positive.  Positive CVA tenderness more on the right side Musculoskeletal: no clubbing / cyanosis. No joint deformity upper and lower extremities.  Skin: no rashes, lesions, ulcers. No induration Neurologic: CN 2-12 grossly intact. Sensation intact.  Strength 5/5 in all 4.  Psychiatric: Normal judgment and insight. Alert and oriented x 3. Normal mood.    Labs on Admission: I have personally reviewed following labs and imaging studies  CBC: Recent Labs  Lab 09/12/17 1126 09/14/17 1155  WBC 6.0 4.9  NEUTROABS 3.3 2.3  HGB 12.3 13.1  HCT 36.6 39.8  MCV 95.1 95.2  PLT 287 305   Basic Metabolic Panel: Recent Labs  Lab 09/12/17 1126 09/14/17 1155  NA 134* 134*  K 3.9 4.1  CL 106 105  CO2 23 24  GLUCOSE 99 101*  BUN 10 11  CREATININE 0.81 0.83  CALCIUM 8.5* 8.5*   GFR: Estimated Creatinine Clearance: 106.2 mL/min (by C-G formula based on SCr of 0.83 mg/dL). Liver Function Tests: Recent Labs  Lab 09/14/17 1155  AST  19  ALT 14  ALKPHOS 69  BILITOT 0.6  PROT 7.5  ALBUMIN 3.9   No results for input(s): LIPASE, AMYLASE in the last 168 hours. No results for input(s): AMMONIA in the last 168 hours. Coagulation Profile: No results for input(s): INR, PROTIME in the last 168 hours. Cardiac Enzymes: No results for input(s): CKTOTAL, CKMB, CKMBINDEX, TROPONINI in the last 168 hours. BNP (last 3 results) No results for input(s): PROBNP in the last 8760 hours. HbA1C: No results for input(s): HGBA1C in the last 72 hours. CBG: No results for input(s): GLUCAP in the last 168 hours. Lipid Profile: No results for input(s): CHOL, HDL, LDLCALC, TRIG, CHOLHDL, LDLDIRECT in the last 72 hours. Thyroid Function Tests: No results for input(s): TSH, T4TOTAL, FREET4, T3FREE, THYROIDAB in the last 72 hours. Anemia Panel: No results for input(s): VITAMINB12, FOLATE, FERRITIN, TIBC, IRON, RETICCTPCT in the last 72 hours. Urine analysis:    Component Value Date/Time   COLORURINE YELLOW 09/14/2017 1110   APPEARANCEUR CLOUDY (A) 09/14/2017 1110   LABSPEC >1.030 (H) 09/14/2017 1110   PHURINE 6.0 09/14/2017 1110   GLUCOSEU NEGATIVE  09/14/2017 1110   HGBUR TRACE (A) 09/14/2017 1110   BILIRUBINUR NEGATIVE 09/14/2017 1110   BILIRUBINUR neg 05/09/2014 1349   KETONESUR NEGATIVE 09/14/2017 1110   PROTEINUR 30 (A) 09/14/2017 1110   UROBILINOGEN 0.2 07/17/2015 0945   NITRITE NEGATIVE 09/14/2017 1110   LEUKOCYTESUR SMALL (A) 09/14/2017 1110   Sepsis Labs: !!!!!!!!!!!!!!!!!!!!!!!!!!!!!!!!!!!!!!!!!!!! @LABRCNTIP (procalcitonin:4,lacticidven:4) ) Recent Results (from the past 240 hour(s))  Urine culture     Status: Abnormal   Collection Time: 09/12/17  8:39 AM  Result Value Ref Range Status   Specimen Description URINE, CLEAN CATCH  Final   Special Requests NONE  Final   Culture MULTIPLE SPECIES PRESENT, SUGGEST RECOLLECTION (A)  Final   Report Status 09/13/2017 FINAL  Final     Radiological Exams on Admission: Ct Renal Stone Study  Result Date: 09/14/2017 CLINICAL DATA:  41 year old female with recent diagnosis of pyelonephritis. Persistent flank pain, nausea and vomiting EXAM: CT ABDOMEN AND PELVIS WITHOUT CONTRAST TECHNIQUE: Multidetector CT imaging of the abdomen and pelvis was performed following the standard protocol without IV contrast. COMPARISON:  Prior CT scan of the abdomen and pelvis 08/12/2017 FINDINGS: Lower chest: The lung bases are clear. Visualized cardiac structures are within normal limits for size. No pericardial effusion. Unremarkable visualized distal thoracic esophagus. Hepatobiliary: Normal hepatic contour and morphology. No discrete hepatic lesions. Normal appearance of the gallbladder. No intra or extrahepatic biliary ductal dilatation. Pancreas: Unremarkable. No pancreatic ductal dilatation or surrounding inflammatory changes. Spleen: Normal in size without focal abnormality. Adrenals/Urinary Tract: Normal adrenal glands. No evidence of hydronephrosis. Stable appearance of the kidneys. The right kidney again demonstrates multifocal renal cortical scarring with areas of both cortical calcification and  likely stones within the collecting system. The largest stone in the lower pole measures up to 2.1 cm. The left kidney is also similar in appearance with a slightly irregular border and very mild perinephric stranding. The kidney is unchanged in size. The ureters and bladder are normal. Stomach/Bowel: No evidence of obstruction or focal bowel wall thickening. Normal appendix in the right lower quadrant. The terminal ileum is unremarkable. Vascular/Lymphatic: Limited evaluation in the absence of intravenous contrast. No aneurysm or significant atherosclerotic vascular calcification. No suspicious lymphadenopathy. Reproductive: Uterus and bilateral adnexa are unremarkable. Other: No abdominal wall hernia or abnormality. No abdominopelvic ascites. Musculoskeletal: No acute or significant osseous findings. IMPRESSION: 1. No acute intra-abdominal process.  Specifically, no evidence of hydronephrosis. 2. Stable appearance of the kidneys as described above compared to 08/12/2017. Evaluation for pyelonephritis is limited in the absence of intravenous contrast. Electronically Signed   By: Malachy Moan M.D.   On: 09/14/2017 12:26    Assessment/Plan Active Problems:   Pyelonephritis   HTN (hypertension)  #Pyelonephritis failed outpatient oral antibiotics: -Not able to take oral medication because of intractable nausea vomiting.  The urine culture from prior ER admission growing multiple organisms.  Repeat urine culture sent today from ER.  To follow-up the results.  Continue IV subtraction.  Continue IV fluid and supportive care.  Patient has chronic nephrolithiasis, recommended outpatient follow-up with urologist after treatment of infection. -Patient is afebrile and has no leukocytosis.  #Intractable nausea vomiting: Treated with antibiotics, IV fluids, Phenergan as needed.  Patient is allergic to Zofran.  #Hypertension: Continue metoprolol.  Hold lisinopril and diuretics.  Monitor blood pressure.  Active  smoker: Education provided to the patient.  She is trying to quit. Obesity: Patient was consulted.  PCP follow-up.  DVT prophylaxis: Lovenox subcutaneous Code Status: Full code Family Communication: No family at bedside Disposition Plan: MedSurg Consults called: None Admission status: Inpatient   Florie Carico Jaynie Collins MD [facility] Pager [phone]  If 7PM-7AM, please contact night-coverage [email or web address] Password [email or web address]  09/14/2017, 4:30 PM

## 2017-09-14 NOTE — ED Provider Notes (Signed)
[facility] EMERGENCY DEPARTMENT Provider Note   CSN: [id number] Arrival date & time: 09/14/17  1026     History   Chief Complaint Chief Complaint  Patient presents with  . Emesis    HPI [name] is a 41 y.o. female.  HPI   Patient is a 41 year old female presenting with recurrent symptoms of pyelonephritis.  Patient had recent hospitalization at [facility] for pyelonephritis 1 month ago. Was discahrged on cipro. Patient was seen 2 days ago here in the emergency department for pyelonephritis again.Marland Kitchen  Discharge on Keflex (previously been on Cipro).    Patient reports that she has been unable to tolerate any p.o. antibiotics.  She reports vomiting up right afterwards every time.  She reports even taking her nausea medicine beforehand and she still vomits.  Patient had multiple pyelonephritis in the past.  She also has history of stones.  Patient has seen urology in the past but not recently because she is not on any insurance Nonspecific bilateral flank pain.  Suprapubic pain.  Reports dry mouth, mild headache generally feeling fatigued.  Past Medical History:  Diagnosis Date  . Asthma   . Bacterial vaginosis   . Candidal vaginitis   . Chronic UTI   . Complication of anesthesia   . Hypertension   . Kidney stones   . Ovarian cyst   . Pyelonephritis, chronic     Patient Active Problem List   Diagnosis Date Noted  . Vomiting 04/21/2012  . Pyelonephritis 04/20/2012  . HTN (hypertension) 04/20/2012  . Asthma 04/20/2012    Past Surgical History:  Procedure Laterality Date  . CYSTOSCOPY  2001-2012   "total of 11; for chronic chronic pyelonephritis"  . LITHOTRIPSY  2000's  . nephrostomy    . TUBAL LIGATION  2002    OB History    No data available       Home Medications    Prior to Admission medications   Medication Sig Start Date End Date Taking? Authorizing Provider  cephALEXin (KEFLEX) 500 MG capsule Take 1 capsule (500 mg  total) by mouth 3 (three) times daily. 09/12/17  Yes Hedges, Tinnie Gens, PA-C  hydrochlorothiazide (HYDRODIURIL) 12.5 MG tablet Take 1 tablet (12.5 mg total) by mouth daily. 10/14/14  Yes Mirian Mo, MD  lisinopril (PRINIVIL,ZESTRIL) 10 MG tablet Take 1 tablet (10 mg total) by mouth daily. 05/31/17  Yes Tegeler, Canary Brim, MD  metoprolol succinate (TOPROL-XL) 25 MG 24 hr tablet Take 1 tablet (25 mg total) by mouth daily. 10/14/14  Yes Mirian Mo, MD  promethazine (PHENERGAN) 25 MG tablet Take 1 tablet (25 mg total) by mouth every 6 (six) hours as needed for nausea or vomiting. 09/12/17  Yes Hedges, Tinnie Gens, PA-C  acetaminophen (TYLENOL) 500 MG tablet Take 1,000 mg by mouth every 6 (six) hours as needed for moderate pain.    [provider]  albuterol (PROVENTIL HFA;VENTOLIN HFA) 108 (90 BASE) MCG/ACT inhaler Inhale 2 puffs into the lungs every 6 (six) hours as needed for wheezing. For wheezing    [provider]  fluconazole (DIFLUCAN) 150 MG tablet Take 1 tablet (150 mg total) by mouth daily. Please take one tab now and one after completing antibiotic therapy 09/12/17   Hedges, Tinnie Gens, PA-C  guaiFENesin (MUCINEX) 600 MG 12 hr tablet Take 1,200 mg by mouth 2 (two) times daily.    [provider]  metoprolol succinate (TOPROL-XL) 25 MG 24 hr tablet Take 1 tablet (25 mg total) by mouth daily. 05/31/17  Tegeler, Canary Brim, MD  metoprolol tartrate (LOPRESSOR) 25 MG tablet Take 12.5 mg by mouth 2 (two) times daily.    [provider]  promethazine-dextromethorphan (PROMETHAZINE-DM) 6.25-15 MG/5ML syrup Take 5 mLs by mouth 3 (three) times daily as needed for cough. 07/17/15   Jana Half, MD  ranitidine (ZANTAC) 150 MG capsule Take 1 capsule (150 mg total) by mouth daily. Patient not taking: Reported on 07/17/2015 06/29/14   Joycie Peek, PA-C    Family History Family History  Problem Relation Age of Onset  . HIV/AIDS Mother   . Hypertension  Father     Social History Social History   Tobacco Use  . Smoking status: Current Every Day Smoker    Packs/day: 0.25    Years: 18.00    Pack years: 4.50    Types: Cigarettes  . Smokeless tobacco: Never Used  Substance Use Topics  . Alcohol use: No  . Drug use: No     Allergies   Flagyl [metronidazole hcl]; Ketorolac tromethamine; Metoclopramide hcl; Percocet [oxycodone-acetaminophen]; and Zofran [ondansetron hcl]   Review of Systems Review of Systems  Constitutional: Positive for fatigue. Negative for activity change and fever.  Respiratory: Negative for shortness of breath.   Cardiovascular: Negative for chest pain.  Gastrointestinal: Positive for nausea and vomiting. Negative for abdominal pain.  Genitourinary: Positive for dysuria and flank pain.  All other systems reviewed and are negative.    Physical Exam Updated Vital Signs BP (!) 120/92 (BP Location: Right Arm)   Pulse 72   Temp 97.7 F (36.5 C) (Oral)   Resp 16   Ht 5\' 5"  (1.651 m)   Wt 99.3 kg (219 lb)   LMP 09/03/2017   SpO2 97%   BMI 36.44 kg/m   Physical Exam  Constitutional: She is oriented to person, place, and time. She appears well-developed and well-nourished.  HENT:  Head: Normocephalic and atraumatic.  Dry mucous membranes.  Eyes: Right eye exhibits no discharge.  Cardiovascular: Normal rate, regular rhythm and normal heart sounds.  No murmur heard. Pulmonary/Chest: Effort normal and breath sounds normal. She has no wheezes. She has no rales.  Abdominal: Soft. She exhibits no distension. There is no tenderness.  Neurological: She is oriented to person, place, and time.  Skin: Skin is warm and dry. She is not diaphoretic.  Psychiatric: She has a normal mood and affect.  Nursing note and vitals reviewed.    ED Treatments / Results  Labs (all labs ordered are listed, but only abnormal results are displayed) Labs Reviewed  COMPREHENSIVE METABOLIC PANEL - Abnormal; Notable for the  following components:      Result Value   Sodium 134 (*)    Glucose, Bld 101 (*)    Calcium 8.5 (*)    All other components within normal limits  URINALYSIS, ROUTINE W REFLEX MICROSCOPIC - Abnormal; Notable for the following components:   APPearance CLOUDY (*)    Specific Gravity, Urine >1.030 (*)    Hgb urine dipstick TRACE (*)    Protein, ur 30 (*)    Leukocytes, UA SMALL (*)    All other components within normal limits  URINALYSIS, MICROSCOPIC (REFLEX) - Abnormal; Notable for the following components:   Bacteria, UA FEW (*)    Squamous Epithelial / LPF 6-30 (*)    All other components within normal limits  URINE CULTURE  CBC WITH DIFFERENTIAL/PLATELET  PREGNANCY, URINE    EKG  EKG Interpretation None       Radiology Ct  Renal Stone Study  Result Date: 09/14/2017 CLINICAL DATA:  41 year old female with recent diagnosis of pyelonephritis. Persistent flank pain, nausea and vomiting EXAM: CT ABDOMEN AND PELVIS WITHOUT CONTRAST TECHNIQUE: Multidetector CT imaging of the abdomen and pelvis was performed following the standard protocol without IV contrast. COMPARISON:  Prior CT scan of the abdomen and pelvis 08/12/2017 FINDINGS: Lower chest: The lung bases are clear. Visualized cardiac structures are within normal limits for size. No pericardial effusion. Unremarkable visualized distal thoracic esophagus. Hepatobiliary: Normal hepatic contour and morphology. No discrete hepatic lesions. Normal appearance of the gallbladder. No intra or extrahepatic biliary ductal dilatation. Pancreas: Unremarkable. No pancreatic ductal dilatation or surrounding inflammatory changes. Spleen: Normal in size without focal abnormality. Adrenals/Urinary Tract: Normal adrenal glands. No evidence of hydronephrosis. Stable appearance of the kidneys. The right kidney again demonstrates multifocal renal cortical scarring with areas of both cortical calcification and likely stones within the collecting system. The  largest stone in the lower pole measures up to 2.1 cm. The left kidney is also similar in appearance with a slightly irregular border and very mild perinephric stranding. The kidney is unchanged in size. The ureters and bladder are normal. Stomach/Bowel: No evidence of obstruction or focal bowel wall thickening. Normal appendix in the right lower quadrant. The terminal ileum is unremarkable. Vascular/Lymphatic: Limited evaluation in the absence of intravenous contrast. No aneurysm or significant atherosclerotic vascular calcification. No suspicious lymphadenopathy. Reproductive: Uterus and bilateral adnexa are unremarkable. Other: No abdominal wall hernia or abnormality. No abdominopelvic ascites. Musculoskeletal: No acute or significant osseous findings. IMPRESSION: 1. No acute intra-abdominal process. Specifically, no evidence of hydronephrosis. 2. Stable appearance of the kidneys as described above compared to 08/12/2017. Evaluation for pyelonephritis is limited in the absence of intravenous contrast. Electronically Signed   By: Malachy MoanHeath  McCullough M.D.   On: 09/14/2017 12:26    Procedures Procedures (including critical care time)  Medications Ordered in ED Medications  sodium chloride 0.9 % bolus 1,000 mL (0 mLs Intravenous Stopped 09/14/17 1314)  cefTRIAXone (ROCEPHIN) 1 g in dextrose 5 % 50 mL IVPB (0 g Intravenous Stopped 09/14/17 1231)  promethazine (PHENERGAN) injection 12.5 mg (12.5 mg Intravenous Given 09/14/17 1222)     Initial Impression / Assessment and Plan / ED Course  I have reviewed the triage vital signs and the nursing notes.  Pertinent labs & imaging results that were available during my care of the patient were reviewed by me and considered in my medical decision making (see chart for details).     Patient is a 41 year old female presenting with recurrent symptoms of pyelonephritis.  Patient had recent hospitalization at [facility] for pyelonephritis 1 month ago. Was  discahrged on cipro. Patient was seen 2 days ago here in the emergency department for pyelonephritis again.Marland Kitchen.  Discharge on Keflex (previously been on Cipro).    Patient reports that she has been unable to tolerate any p.o. antibiotics.  She reports vomiting up right afterwards every time.  She reports even taking her nausea medicine beforehand and she still vomits.  Patient had multiple pyelonephritis in the past.  She also has history of stones.  Patient has seen urology in the past but not recently because she is not on any insurance Nonspecific bilateral flank pain.  Suprapubic pain.  Reports dry mouth, mild headache generally feeling fatigued.  11:18 AM Patient presenting with pyonephritis that did not respond to p.o. antibiotics.  Fortunately the patient's last culture shows no specific growth, multi organisms.  Will give  ceftriaxone, repeat labs.  Get CT to make sure patient does not have infected stone.  Will likely need admission given that she has not tolerated p.o. Antibiotics.  1:21 PM unable to tolerate PO, will admit for obs.   Final Clinical Impressions(s) / ED Diagnoses   Final diagnoses:  None    ED Discharge Orders    None       Abelino DerrickMackuen, [place] Thielman Lyn, MD 09/14/17 1448

## 2017-09-15 DIAGNOSIS — I10 Essential (primary) hypertension: Secondary | ICD-10-CM

## 2017-09-15 DIAGNOSIS — N12 Tubulo-interstitial nephritis, not specified as acute or chronic: Secondary | ICD-10-CM

## 2017-09-15 LAB — URINE CULTURE

## 2017-09-15 MED ORDER — HYDROCODONE-ACETAMINOPHEN 5-325 MG PO TABS: 1.0000 | ORAL_TABLET | Freq: Four times a day (QID) | ORAL | 0 refills | Status: DC | PRN

## 2017-09-15 NOTE — Care Management Note (Signed)
Case Management Note  Patient Details  Name: [name] MRN: [id number] Date of Birth: 1976/01/22  Subjective/Objective: 41 y/o f admitted w/pyelonephritis. From home. No CM needs.                   Action/Plan:d/c home.   Expected Discharge Date:  09/15/17               Expected Discharge Plan:  Home/Self Care  In-House Referral:     Discharge planning Services  CM Consult  Post Acute Care Choice:    Choice offered to:     DME Arranged:    DME Agency:     HH Arranged:    HH Agency:     Status of Service:  Completed, signed off  If discussed at [employer] Length of Stay Meetings, dates discussed:    Additional Comments:  Lanier ClamMahabir, Chelan Heringer, RN 09/15/2017, 3:30 PM

## 2017-09-15 NOTE — Discharge Summary (Signed)
Physician Discharge Summary  [name] ZOX:[id number] DOB: 05-26-1976 DOA: 09/14/2017  PCP: Patient, No Pcp Per  Admit date: 09/14/2017 Discharge date: 09/15/2017  Time spent: 35* minutes  Recommendations for Outpatient Follow-up:   Follow up urology in 2 weeks   Discharge Diagnoses:  Active Problems:   Pyelonephritis   HTN (hypertension)   Discharge Condition: Stable  Diet recommendation: Regular diet  Filed Weights   09/14/17 1033  Weight: 99.3 kg (219 lb)    History of present illness:  41 y.o. female with medical history significant of hypertension, kidney stones, seen in the ER 2 days prior to admission when patient was discharged home with Keflex for the treatment of pyelonephritis presented with severe nausea vomiting and not able to take medication orally.  Patient reported chills, no improvement dysuria.  Discomfort on the back.  She tried Phenergan for the management of nausea but did not improve.  She was seen in the ER again today when patient was treated with IV ceftriaxone.  Admitted for further evaluation.  Patient denies headache, dizziness, chest pain, shortness of breath, cough, diarrhea or constipation.  No abdominal pain.    Hospital Course:  ?  Pyelonephritis-patient was admitted with?  Pyelonephritis, with failed outpatient treatment.  Repeat urine culture in the hospital was negative, only showed contamination.  Patient has been afebrile with normal WBC.  Her previous urine culture from 09/12/2017 also showed contaminants. I reviewed the CT scan results with her urologist Dr. Laverle PatterBorden, who does not feel that patient has acute pyelonephritis.  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  And she continues to have right CVA pain intermittent.  He recommends her to be followed up as outpatient with urology, and consider nephrectomy of the right kidney.  This was conveyed to patient and patient will be discharged home.  We will not  discharged on any antibiotics at this time.  Will discharge on Vicodin as needed for pain  Hypertension-continue metoprolol, lisinopril, HCTZ  Intractable nausea vomiting-resolved.  Procedures:  None  Consultations:  None  Discharge Exam: Vitals:   09/14/17 2031 09/15/17 0546  BP: 121/76 109/85  Pulse: 76 76  Resp: 18 18  Temp: 98.8 F (37.1 C) 98.4 F (36.9 C)  SpO2: 99% 97%    General: Appears in no acute distress Cardiovascular: S1-S2, regular Respiratory: Clear to auscultation bilaterally  Discharge Instructions   Discharge Instructions    Diet - low sodium heart healthy   Complete by:  As directed    Increase activity slowly   Complete by:  As directed      Current Discharge Medication List    START taking these medications   Details  HYDROcodone-acetaminophen (NORCO/VICODIN) 5-325 MG tablet Take 1 tablet by mouth every 6 (six) hours as needed for moderate pain. Qty: 20 tablet, Refills: 0      CONTINUE these medications which have NOT CHANGED   Details  !! albuterol (PROAIR HFA) 108 (90 Base) MCG/ACT inhaler Inhale 2 puffs into the lungs 4 (four) times daily as needed for wheezing or shortness of breath.    !! albuterol (PROVENTIL HFA;VENTOLIN HFA) 108 (90 BASE) MCG/ACT inhaler Inhale 2 puffs into the lungs every 6 (six) hours as needed for wheezing. For wheezing    fluconazole (DIFLUCAN) 150 MG tablet Take 1 tablet (150 mg total) by mouth daily. Please take one tab now and one after completing antibiotic therapy Qty: 2 tablet, Refills: 0    lisinopril-hydrochlorothiazide (PRINZIDE,ZESTORETIC) 20-12.5 MG tablet Take 1  tablet by mouth daily. Refills: 1    ranitidine (ZANTAC) 150 MG capsule Take 1 capsule (150 mg total) by mouth daily. Qty: 30 capsule, Refills: 0    guaiFENesin (MUCINEX) 600 MG 12 hr tablet Take 1,200 mg by mouth 2 (two) times daily.    metoprolol succinate (TOPROL-XL) 25 MG 24 hr tablet Take 1 tablet (25 mg total) by mouth  daily. Qty: 30 tablet, Refills: 0   Associated Diagnoses: HTN (hypertension)    promethazine-dextromethorphan (PROMETHAZINE-DM) 6.25-15 MG/5ML syrup Take 5 mLs by mouth 3 (three) times daily as needed for cough. Qty: 118 mL, Refills: 0     !! - Potential duplicate medications found. Please discuss with provider.    STOP taking these medications     acetaminophen (TYLENOL) 500 MG tablet      cephALEXin (KEFLEX) 500 MG capsule      Diphenhydramine-APAP, sleep, (TYLENOL PM EXTRA STRENGTH PO)      promethazine (PHENERGAN) 25 MG tablet      hydrochlorothiazide (HYDRODIURIL) 12.5 MG tablet      lisinopril (PRINIVIL,ZESTRIL) 10 MG tablet        Allergies  Allergen Reactions  . Flagyl [Metronidazole Hcl] Hives  . Ketorolac Tromethamine Hives  . Metoclopramide Hcl Other (See Comments)    Facial spasm; "they thought I was having a stroke"  . Percocet [Oxycodone-Acetaminophen] Hives and Itching  . Barium Sulfate Itching  . Zofran [Ondansetron Hcl] Hives      The results of significant diagnostics from this hospitalization (including imaging, microbiology, ancillary and laboratory) are listed below for reference.    Significant Diagnostic Studies: Ct Renal Stone Study  Result Date: 09/14/2017 CLINICAL DATA:  41 year old female with recent diagnosis of pyelonephritis. Persistent flank pain, nausea and vomiting EXAM: CT ABDOMEN AND PELVIS WITHOUT CONTRAST TECHNIQUE: Multidetector CT imaging of the abdomen and pelvis was performed following the standard protocol without IV contrast. COMPARISON:  Prior CT scan of the abdomen and pelvis 08/12/2017 FINDINGS: Lower chest: The lung bases are clear. Visualized cardiac structures are within normal limits for size. No pericardial effusion. Unremarkable visualized distal thoracic esophagus. Hepatobiliary: Normal hepatic contour and morphology. No discrete hepatic lesions. Normal appearance of the gallbladder. No intra or extrahepatic biliary  ductal dilatation. Pancreas: Unremarkable. No pancreatic ductal dilatation or surrounding inflammatory changes. Spleen: Normal in size without focal abnormality. Adrenals/Urinary Tract: Normal adrenal glands. No evidence of hydronephrosis. Stable appearance of the kidneys. The right kidney again demonstrates multifocal renal cortical scarring with areas of both cortical calcification and likely stones within the collecting system. The largest stone in the lower pole measures up to 2.1 cm. The left kidney is also similar in appearance with a slightly irregular border and very mild perinephric stranding. The kidney is unchanged in size. The ureters and bladder are normal. Stomach/Bowel: No evidence of obstruction or focal bowel wall thickening. Normal appendix in the right lower quadrant. The terminal ileum is unremarkable. Vascular/Lymphatic: Limited evaluation in the absence of intravenous contrast. No aneurysm or significant atherosclerotic vascular calcification. No suspicious lymphadenopathy. Reproductive: Uterus and bilateral adnexa are unremarkable. Other: No abdominal wall hernia or abnormality. No abdominopelvic ascites. Musculoskeletal: No acute or significant osseous findings. IMPRESSION: 1. No acute intra-abdominal process. Specifically, no evidence of hydronephrosis. 2. Stable appearance of the kidneys as described above compared to 08/12/2017. Evaluation for pyelonephritis is limited in the absence of intravenous contrast. Electronically Signed   By: Malachy MoanHeath  McCullough M.D.   On: 09/14/2017 12:26    Microbiology: Recent Results (from the  past 240 hour(s))  Urine culture     Status: Abnormal   Collection Time: 09/12/17  8:39 AM  Result Value Ref Range Status   Specimen Description URINE, CLEAN CATCH  Final   Special Requests NONE  Final   Culture MULTIPLE SPECIES PRESENT, SUGGEST RECOLLECTION (A)  Final   Report Status 09/13/2017 FINAL  Final  Urine culture     Status: Abnormal   Collection  Time: 09/14/17 11:10 AM  Result Value Ref Range Status   Specimen Description URINE, CLEAN CATCH  Final   Special Requests NONE  Final   Culture MULTIPLE SPECIES PRESENT, SUGGEST RECOLLECTION (A)  Final   Report Status 09/15/2017 FINAL  Final     Labs: Basic Metabolic Panel: Recent Labs  Lab 09/12/17 1126 09/14/17 1155  NA 134* 134*  K 3.9 4.1  CL 106 105  CO2 23 24  GLUCOSE 99 101*  BUN 10 11  CREATININE 0.81 0.83  CALCIUM 8.5* 8.5*   Liver Function Tests: Recent Labs  Lab 09/14/17 1155  AST 19  ALT 14  ALKPHOS 69  BILITOT 0.6  PROT 7.5  ALBUMIN 3.9   No results for input(s): LIPASE, AMYLASE in the last 168 hours. No results for input(s): AMMONIA in the last 168 hours. CBC: Recent Labs  Lab 09/12/17 1126 09/14/17 1155  WBC 6.0 4.9  NEUTROABS 3.3 2.3  HGB 12.3 13.1  HCT 36.6 39.8  MCV 95.1 95.2  PLT 287 305       Signed:  Meredeth Ide MD.  [facility] 09/15/2017, 3:30 PM

## 2018-08-27 ENCOUNTER — Encounter (HOSPITAL_BASED_OUTPATIENT_CLINIC_OR_DEPARTMENT_OTHER): Payer: Self-pay

## 2018-08-27 ENCOUNTER — Emergency Department (HOSPITAL_BASED_OUTPATIENT_CLINIC_OR_DEPARTMENT_OTHER)
Admission: EM | Admit: 2018-08-27 | Discharge: 2018-08-27 | Disposition: A | Discharge status: Home / Self Care | Payer: Medicaid Other | Attending: Emergency Medicine | Admitting: Emergency Medicine

## 2018-08-27 DIAGNOSIS — F17210 Nicotine dependence, cigarettes, uncomplicated: Secondary | ICD-10-CM | POA: Diagnosis not present

## 2018-08-27 DIAGNOSIS — R05 Cough: Secondary | ICD-10-CM | POA: Diagnosis present

## 2018-08-27 DIAGNOSIS — J209 Acute bronchitis, unspecified: Secondary | ICD-10-CM | POA: Insufficient documentation

## 2018-08-27 DIAGNOSIS — Z79899 Other long term (current) drug therapy: Secondary | ICD-10-CM | POA: Diagnosis not present

## 2018-08-27 DIAGNOSIS — I10 Essential (primary) hypertension: Secondary | ICD-10-CM | POA: Diagnosis not present

## 2018-08-27 DIAGNOSIS — J45909 Unspecified asthma, uncomplicated: Secondary | ICD-10-CM | POA: Insufficient documentation

## 2018-08-27 MED ORDER — ALBUTEROL SULFATE HFA 108 (90 BASE) MCG/ACT IN AERS
2.0000 | INHALATION_SPRAY | RESPIRATORY_TRACT | Status: DC | PRN
  Administered 2018-08-27: 2 via RESPIRATORY_TRACT
  Filled 2018-08-27: qty 6.7

## 2018-08-27 MED ORDER — DEXAMETHASONE SODIUM PHOSPHATE 10 MG/ML IJ SOLN
10.0000 mg | Freq: Once | INTRAMUSCULAR | Status: AC
  Administered 2018-08-27: 10 mg via INTRAMUSCULAR
  Filled 2018-08-27: qty 1

## 2018-08-27 MED ORDER — HYDROCOD POLST-CPM POLST ER 10-8 MG/5ML PO SUER: 5.0000 mL | Freq: Two times a day (BID) | ORAL | 0 refills | Status: DC | PRN

## 2018-08-27 MED ORDER — HYDROCOD POLST-CPM POLST ER 10-8 MG/5ML PO SUER
5.0000 mL | Freq: Once | ORAL | Status: AC
Start: 2018-08-27 — End: 2018-08-27
  Administered 2018-08-27: 5 mL via ORAL
  Filled 2018-08-27: qty 5

## 2018-08-27 NOTE — ED Provider Notes (Signed)
[facility] Provider Note: Lowella Dell, MD, FACEP  CSN: [id number] MRN: [id number] ARRIVAL: 08/27/18 at 0132 ROOM: [facility]06/[facility]06   CHIEF COMPLAINT  Cough   HISTORY OF PRESENT ILLNESS  08/27/18 1:57 AM [name] is a 42 y.o. female with history of asthma.  She is here with a 3-day history of a cough which she describes as dry and persistent.  She has had a subjective fever 3 days ago but that has resolved.  She has a hoarse voice as well.  She has been taking over-the-counter medication without relief.   Past Medical History:  Diagnosis Date  . Asthma   . Bacterial vaginosis   . Candidal vaginitis   . Chronic UTI   . Complication of anesthesia   . Hypertension   . Kidney stones   . Ovarian cyst   . Pyelonephritis, chronic     Past Surgical History:  Procedure Laterality Date  . CYSTOSCOPY  2001-2012   "total of 11; for chronic chronic pyelonephritis"  . LITHOTRIPSY  2000's  . nephrostomy    . TUBAL LIGATION  2002    Family History  Problem Relation Age of Onset  . HIV/AIDS Mother   . Hypertension Father     Social History   Tobacco Use  . Smoking status: Current Every Day Smoker    Packs/day: 0.25    Years: 18.00    Pack years: 4.50    Types: Cigarettes  . Smokeless tobacco: Never Used  Substance Use Topics  . Alcohol use: No  . Drug use: No    Prior to Admission medications   Medication Sig Start Date End Date Taking? Authorizing Provider  albuterol (PROAIR HFA) 108 (90 Base) MCG/ACT inhaler Inhale 2 puffs into the lungs 4 (four) times daily as needed for wheezing or shortness of breath. 01/26/12   [provider]  albuterol (PROVENTIL HFA;VENTOLIN HFA) 108 (90 BASE) MCG/ACT inhaler Inhale 2 puffs into the lungs every 6 (six) hours as needed for wheezing. For wheezing    [provider]  fluconazole (DIFLUCAN) 150 MG tablet Take 1 tablet (150 mg total) by mouth daily. Please take one tab now and one after completing  antibiotic therapy 09/12/17   Hedges, Tinnie Gens, PA-C  guaiFENesin (MUCINEX) 600 MG 12 hr tablet Take 1,200 mg by mouth 2 (two) times daily.    [provider]  HYDROcodone-acetaminophen (NORCO/VICODIN) 5-325 MG tablet Take 1 tablet by mouth every 6 (six) hours as needed for moderate pain. 09/15/17   Meredeth Ide, MD  lisinopril-hydrochlorothiazide (PRINZIDE,ZESTORETIC) 20-12.5 MG tablet Take 1 tablet by mouth daily. 09/11/17   [provider]  ranitidine (ZANTAC) 150 MG capsule Take 1 capsule (150 mg total) by mouth daily. 06/29/14   Joycie Peek, PA-C    Allergies Flagyl [metronidazole hcl]; Ketorolac tromethamine; Metoclopramide hcl; Percocet [oxycodone-acetaminophen]; Barium sulfate; and Zofran [ondansetron hcl]   REVIEW OF SYSTEMS  Negative except as noted here or in the History of Present Illness.   PHYSICAL EXAMINATION  Initial Vital Signs Blood pressure (!) 117/98, pulse 97, temperature 98.9 F (37.2 C), temperature source Oral, resp. rate 20, height 5\' 5"  (1.651 m), weight 108.9 kg, last menstrual period 08/08/2018, SpO2 100 %.  Examination General: Well-developed, well-nourished female in no acute distress; appearance consistent with age of record HENT: normocephalic; atraumatic; dysphonia; no pharyngeal erythema or exudate Eyes: pupils equal, round and reactive to light; extraocular muscles intact Neck: supple Heart: regular rate and rhythm Lungs: clear to auscultation bilaterally;  persistent dry cough Abdomen: soft; nondistended; nontender; bowel sounds present Extremities: No deformity; full range of motion; pulses normal Neurologic: Awake, alert and oriented; motor function intact in all extremities and symmetric; no facial droop Skin: Warm and dry Psychiatric: Normal mood and affect   RESULTS  Summary of this visit's results, reviewed by myself:   EKG Interpretation  Date/Time:    Ventricular Rate:    PR Interval:    QRS Duration:   QT  Interval:    QTC Calculation:   R Axis:     Text Interpretation:        Laboratory Studies: No results found for this or any previous visit (from the past 24 hour(s)). Imaging Studies: No results found.  ED COURSE and MDM  Nursing notes and initial vitals signs, including pulse oximetry, reviewed.  Vitals:   08/27/18 0141 08/27/18 0206  BP: (!) 117/98   Pulse: 97   Resp: 20   Temp: 98.9 F (37.2 C)   TempSrc: Oral   SpO2: 100% 99%  Weight: 108.9 kg   Height: 5\' 5"  (1.651 m)    2:18 AM Patient feeling better after albuterol inhaler treatment.  We will provide her with Tussionex and a dose of dexamethasone for acute bronchitis.  PROCEDURES    ED DIAGNOSES     ICD-10-CM   1. Acute bronchitis with bronchospasm J20.9        Dyami Umbach, MD 08/27/18 [phone]

## 2018-08-27 NOTE — ED Triage Notes (Signed)
Cough and hoarse voice for the last few days, fever at home on Wednesday, none today, not relieved with OTC medication

## 2018-11-17 IMAGING — CT CT RENAL STONE PROTOCOL
2 of 4 series · 16 of 46 positions shown, 18 images · non-contrast
Comparison: Prior CT scan of the abdomen and pelvis 08/12/2017

CLINICAL DATA: 40-year-old female with recent diagnosis of
pyelonephritis. Persistent flank pain, nausea and vomiting

EXAM:
CT ABDOMEN AND PELVIS WITHOUT CONTRAST
TECHNIQUE: Multidetector CT imaging of the abdomen and pelvis was performed
following the standard protocol without IV contrast.

[Series 2: axial st · axial · 0.98mm/px · z∈[-534,-79]mm · 13 of 99 slices shown, 15 images]
[im 4/99  soft-tissue]
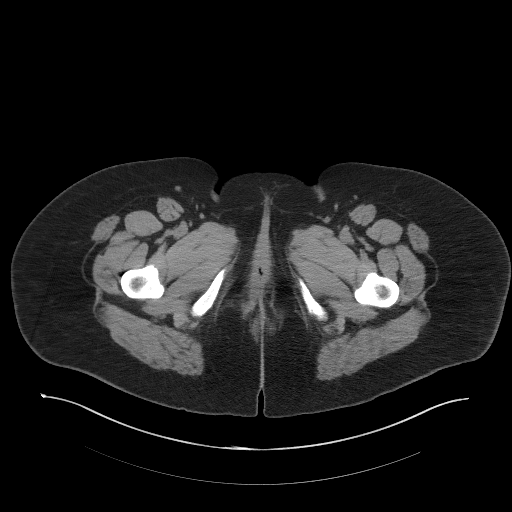
[im 4/99  bone]
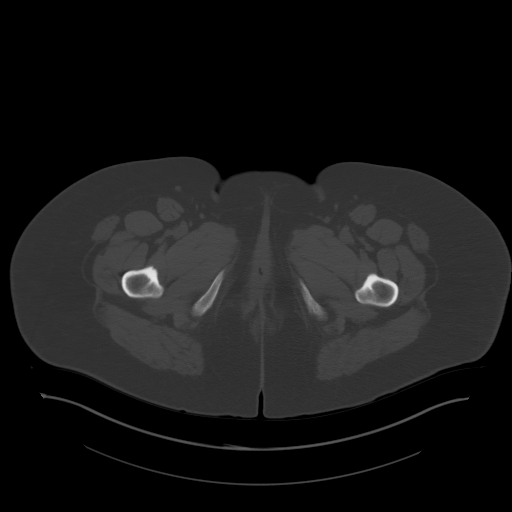
[im 12/99  soft-tissue]
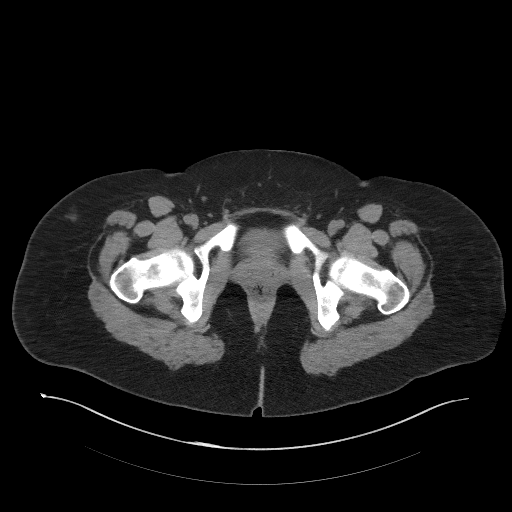
[im 20/99  soft-tissue]
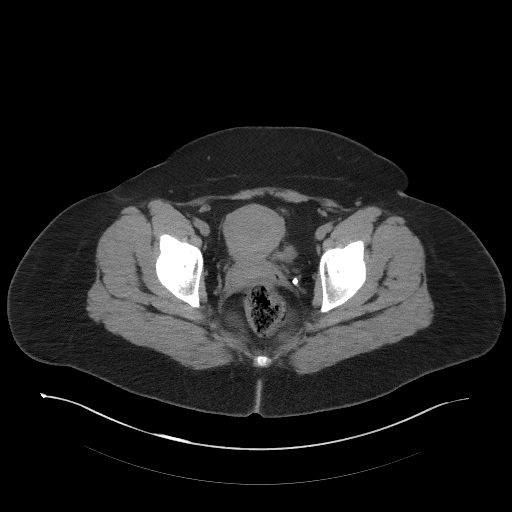
[im 28/99  soft-tissue]
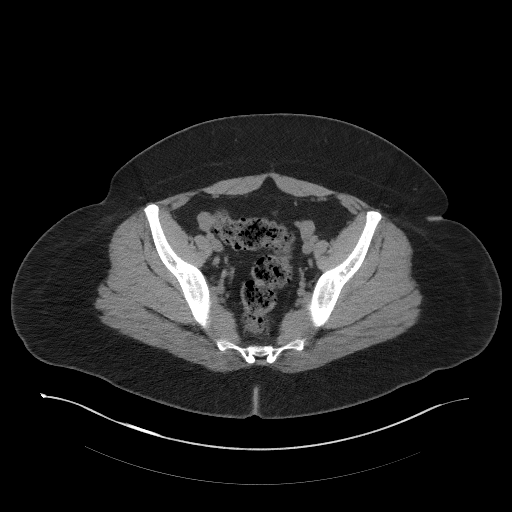
[im 36/99  soft-tissue]
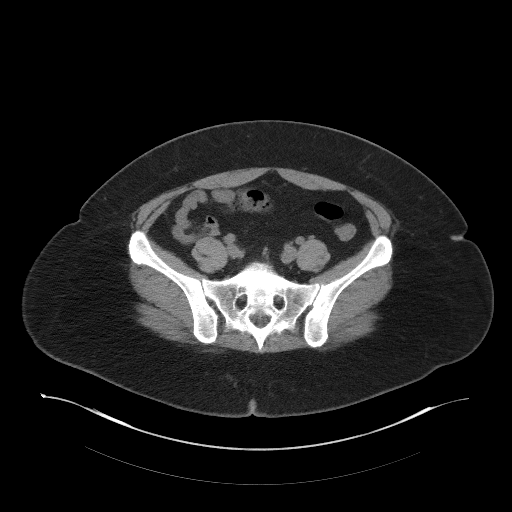
[im 44/99  soft-tissue]
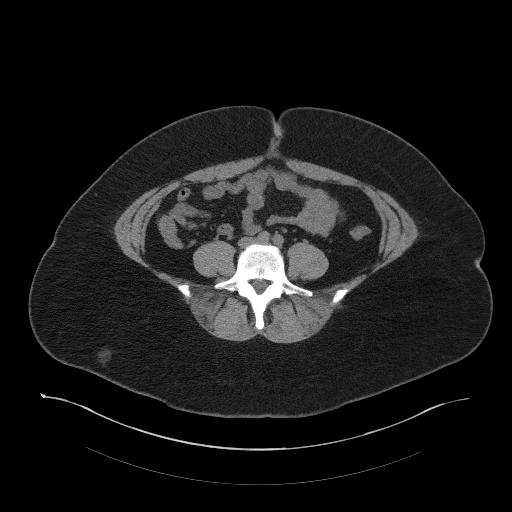
[im 51/99  soft-tissue]
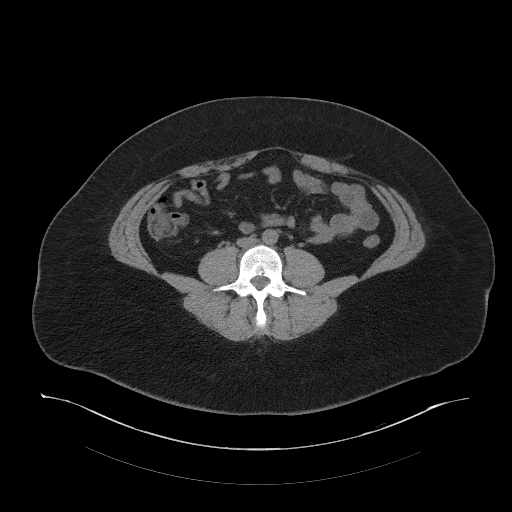
[im 55/99  soft-tissue]
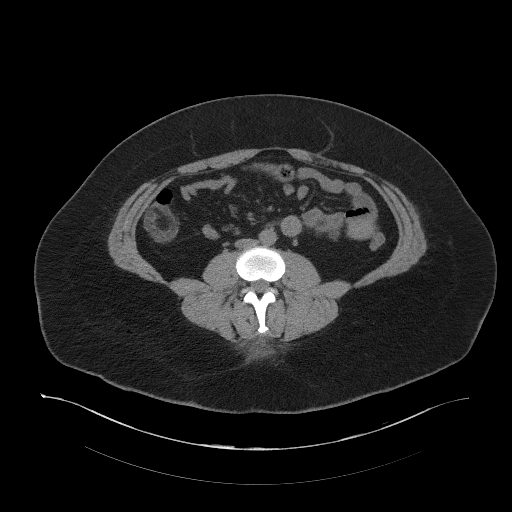
[im 63/99  soft-tissue]
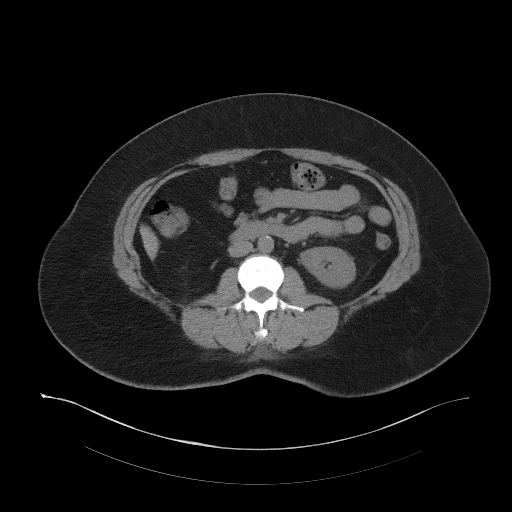
[im 63/99  bone]
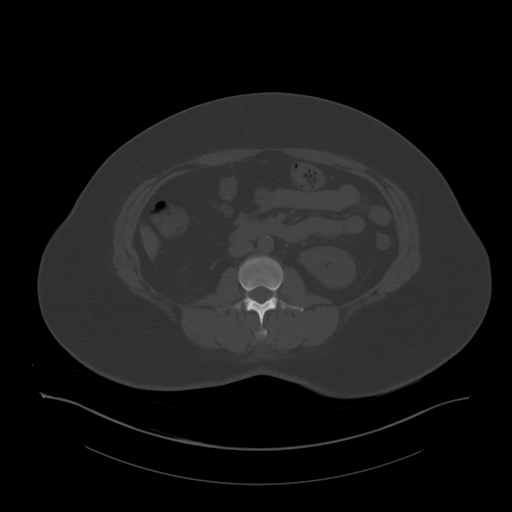
[im 71/99  soft-tissue]
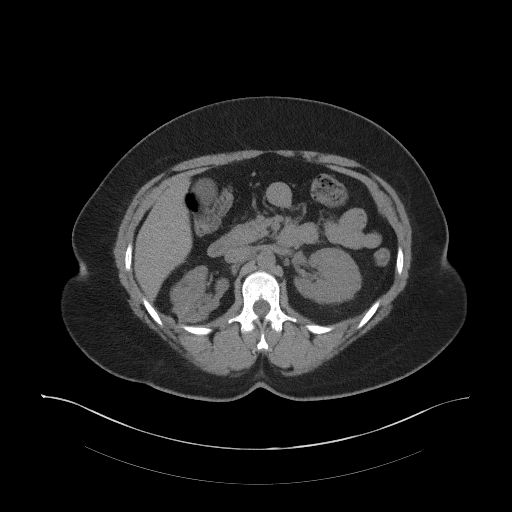
[im 79/99  soft-tissue]
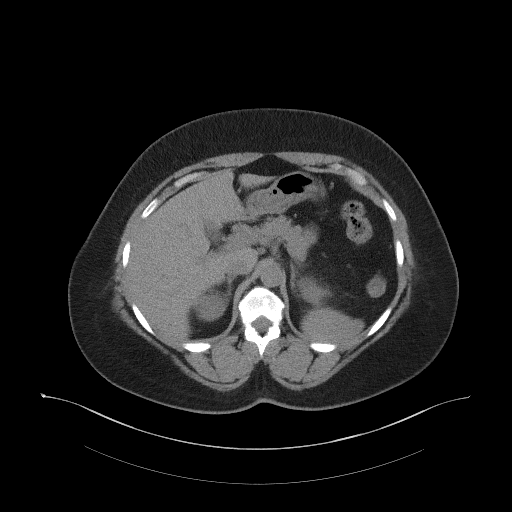
[im 87/99  soft-tissue]
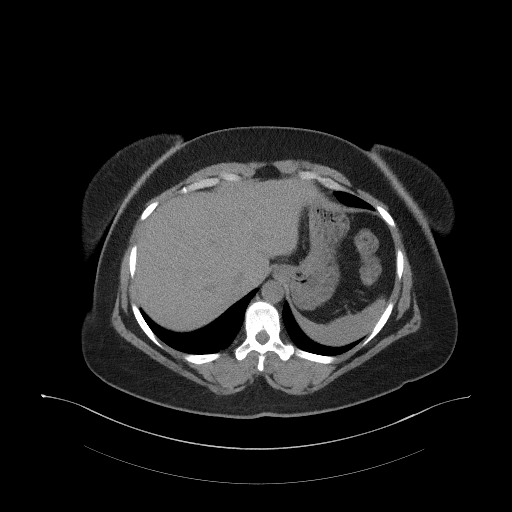
[im 95/99  soft-tissue]
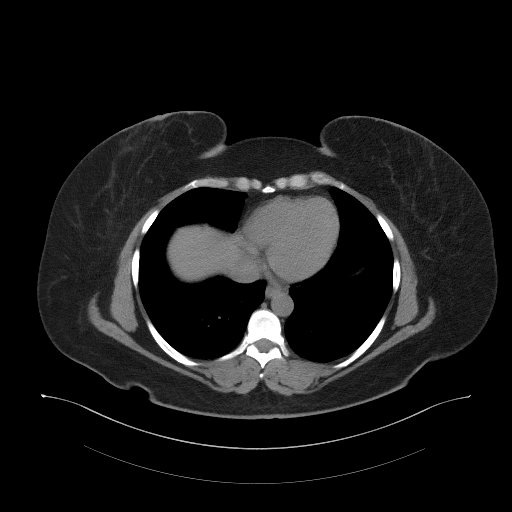

[Series 4: coronal st · coronal · 0.85mm/px · 3 of 87 slices shown]
[im 29/87  soft-tissue]
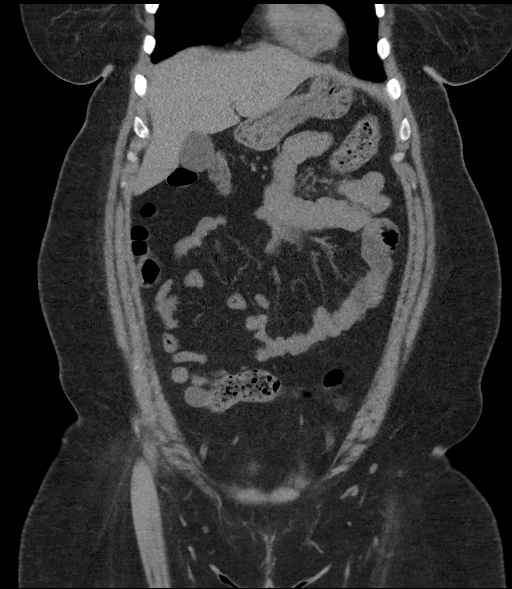
[im 39/87  soft-tissue]
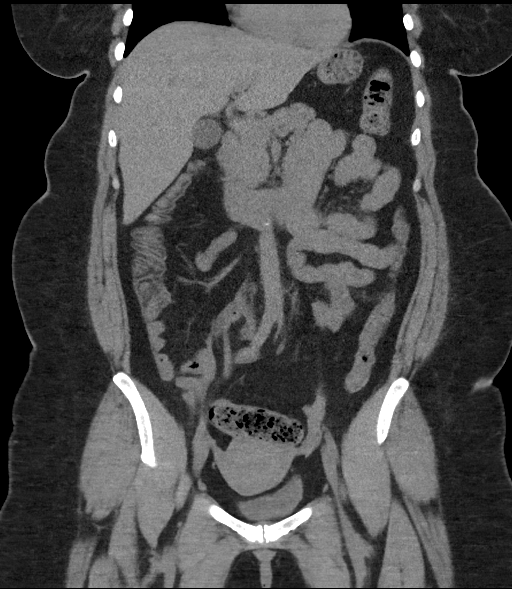
[im 48/87  soft-tissue]
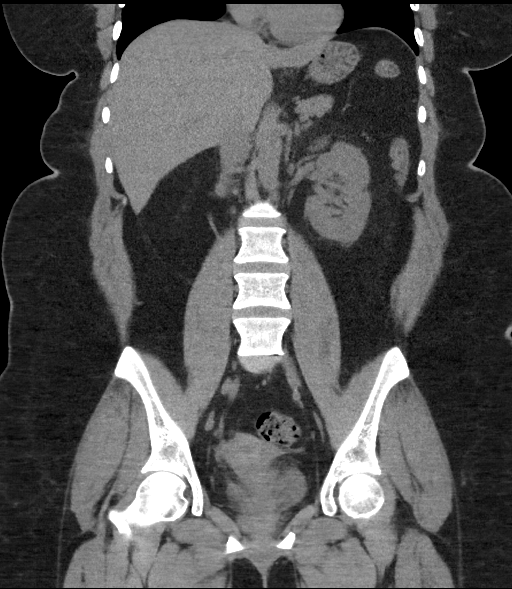

[16 of 46 positions shown; findings below may reference images not displayed]

FINDINGS: Lower chest: The lung bases are clear. Visualized cardiac structures
are within normal limits for size. No pericardial effusion.
Unremarkable visualized distal thoracic esophagus.

Hepatobiliary: Normal hepatic contour and morphology. No discrete
hepatic lesions. Normal appearance of the gallbladder. No intra or
extrahepatic biliary ductal dilatation.

Pancreas: Unremarkable. No pancreatic ductal dilatation or
surrounding inflammatory changes.

Spleen: Normal in size without focal abnormality.

Adrenals/Urinary Tract: Normal adrenal glands. No evidence of
hydronephrosis. Stable appearance of the kidneys. The right kidney
again demonstrates multifocal renal cortical scarring with areas of
both cortical calcification and likely stones within the collecting
system. The largest stone in the lower pole measures up to 2.1 cm.
The left kidney is also similar in appearance with a slightly
irregular border and very mild perinephric stranding. The kidney is
unchanged in size. The ureters and bladder are normal.

Stomach/Bowel: No evidence of obstruction or focal bowel wall
thickening. Normal appendix in the right lower quadrant. The
terminal ileum is unremarkable.

Vascular/Lymphatic: Limited evaluation in the absence of intravenous
contrast. No aneurysm or significant atherosclerotic vascular
calcification. No suspicious lymphadenopathy.

Reproductive: Uterus and bilateral adnexa are unremarkable.

Other: No abdominal wall hernia or abnormality. No abdominopelvic
ascites.

Musculoskeletal: No acute or significant osseous findings.
IMPRESSION: 1. No acute intra-abdominal process. Specifically, no evidence of
hydronephrosis.
2. Stable appearance of the kidneys as described above compared to
08/12/2017. Evaluation for pyelonephritis is limited in the absence
of intravenous contrast.

## 2020-12-23 ENCOUNTER — Emergency Department (HOSPITAL_BASED_OUTPATIENT_CLINIC_OR_DEPARTMENT_OTHER): Payer: Medicaid Other

## 2020-12-23 ENCOUNTER — Emergency Department (HOSPITAL_BASED_OUTPATIENT_CLINIC_OR_DEPARTMENT_OTHER)
Admission: EM | Admit: 2020-12-23 | Discharge: 2020-12-23 | Disposition: A | Discharge status: Home / Self Care | Payer: Medicaid Other | Attending: Emergency Medicine | Admitting: Emergency Medicine

## 2020-12-23 ENCOUNTER — Other Ambulatory Visit: Payer: Self-pay

## 2020-12-23 ENCOUNTER — Encounter (HOSPITAL_BASED_OUTPATIENT_CLINIC_OR_DEPARTMENT_OTHER): Payer: Self-pay | Admitting: *Deleted

## 2020-12-23 DIAGNOSIS — N132 Hydronephrosis with renal and ureteral calculous obstruction: Secondary | ICD-10-CM | POA: Insufficient documentation

## 2020-12-23 DIAGNOSIS — J45909 Unspecified asthma, uncomplicated: Secondary | ICD-10-CM | POA: Diagnosis not present

## 2020-12-23 DIAGNOSIS — Z79899 Other long term (current) drug therapy: Secondary | ICD-10-CM | POA: Insufficient documentation

## 2020-12-23 DIAGNOSIS — I10 Essential (primary) hypertension: Secondary | ICD-10-CM | POA: Diagnosis not present

## 2020-12-23 DIAGNOSIS — R109 Unspecified abdominal pain: Secondary | ICD-10-CM | POA: Diagnosis present

## 2020-12-23 DIAGNOSIS — Z9851 Tubal ligation status: Secondary | ICD-10-CM | POA: Insufficient documentation

## 2020-12-23 DIAGNOSIS — F17210 Nicotine dependence, cigarettes, uncomplicated: Secondary | ICD-10-CM | POA: Diagnosis not present

## 2020-12-23 DIAGNOSIS — N200 Calculus of kidney: Secondary | ICD-10-CM

## 2020-12-23 LAB — CBC WITH DIFFERENTIAL/PLATELET
Abs Immature Granulocytes: 0.04 10*3/uL (ref 0.00–0.07)
Basophils Absolute: 0 10*3/uL (ref 0.0–0.1)
Basophils Relative: 0 %
Eosinophils Absolute: 0 10*3/uL (ref 0.0–0.5)
Eosinophils Relative: 0 %
HCT: 36.2 % (ref 36.0–46.0)
Hemoglobin: 12.3 g/dL (ref 12.0–15.0)
Immature Granulocytes: 0 %
Lymphocytes Relative: 15 %
Lymphs Abs: 1.8 10*3/uL (ref 0.7–4.0)
MCH: 32.7 pg (ref 26.0–34.0)
MCHC: 34 g/dL (ref 30.0–36.0)
MCV: 96.3 fL (ref 80.0–100.0)
Monocytes Absolute: 0.5 10*3/uL (ref 0.1–1.0)
Monocytes Relative: 4 %
Neutro Abs: 9.5 10*3/uL — ABNORMAL HIGH (ref 1.7–7.7)
Neutrophils Relative %: 81 %
Platelets: 279 10*3/uL (ref 150–400)
RBC: 3.76 MIL/uL — ABNORMAL LOW (ref 3.87–5.11)
RDW: 13.7 % (ref 11.5–15.5)
WBC: 11.8 10*3/uL — ABNORMAL HIGH (ref 4.0–10.5)
nRBC: 0 % (ref 0.0–0.2)

## 2020-12-23 LAB — URINALYSIS, ROUTINE W REFLEX MICROSCOPIC
Bilirubin Urine: NEGATIVE
Glucose, UA: NEGATIVE mg/dL
Ketones, ur: NEGATIVE mg/dL
Leukocytes,Ua: NEGATIVE
Nitrite: NEGATIVE
Protein, ur: 100 mg/dL — AB
Specific Gravity, Urine: 1.025 (ref 1.005–1.030)
pH: 6.5 (ref 5.0–8.0)

## 2020-12-23 LAB — COMPREHENSIVE METABOLIC PANEL
ALT: 15 U/L (ref 0–44)
AST: 21 U/L (ref 15–41)
Albumin: 3.9 g/dL (ref 3.5–5.0)
Alkaline Phosphatase: 52 U/L (ref 38–126)
Anion gap: 10 (ref 5–15)
BUN: 11 mg/dL (ref 6–20)
CO2: 24 mmol/L (ref 22–32)
Calcium: 8.7 mg/dL — ABNORMAL LOW (ref 8.9–10.3)
Chloride: 103 mmol/L (ref 98–111)
Creatinine, Ser: 0.94 mg/dL (ref 0.44–1.00)
GFR, Estimated: >60 mL/min (ref >60)
Glucose, Bld: 107 mg/dL — ABNORMAL HIGH (ref 70–99)
Potassium: 3.8 mmol/L (ref 3.5–5.1)
Sodium: 137 mmol/L (ref 135–145)
Total Bilirubin: 0.6 mg/dL (ref 0.3–1.2)
Total Protein: 7.1 g/dL (ref 6.5–8.1)

## 2020-12-23 LAB — URINALYSIS, MICROSCOPIC (REFLEX)

## 2020-12-23 LAB — PREGNANCY, URINE: Preg Test, Ur: NEGATIVE

## 2020-12-23 MED ORDER — FENTANYL CITRATE (PF) 100 MCG/2ML IJ SOLN
50.0000 ug | Freq: Once | INTRAMUSCULAR | Status: AC
  Administered 2020-12-23: 50 ug via INTRAVENOUS
  Filled 2020-12-23: qty 2

## 2020-12-23 MED ORDER — PROMETHAZINE HCL 25 MG/ML IJ SOLN
25.0000 mg | Freq: Once | INTRAMUSCULAR | Status: AC
  Administered 2020-12-23: 25 mg via INTRAVENOUS
  Filled 2020-12-23: qty 1

## 2020-12-23 MED ORDER — HYDROMORPHONE HCL 1 MG/ML IJ SOLN
0.5000 mg | Freq: Once | INTRAMUSCULAR | Status: AC
  Administered 2020-12-23: 0.5 mg via INTRAVENOUS
  Filled 2020-12-23: qty 1

## 2020-12-23 NOTE — ED Triage Notes (Signed)
Right flank pain since last night. Hx of kidney stones.

## 2020-12-23 NOTE — Discharge Instructions (Signed)
As we discussed, your work-up today was reassuring.  Your CT scan showed you most likely recently passed a kidney stone which would contribute and explain your pain.  Please follow-up with your urologist and let them know of this visit.  Return emergency department for any worsening pain, fever, vomiting, pain with urination, blood in urine.

## 2020-12-23 NOTE — ED Notes (Signed)
ED Provider at bedside. 

## 2020-12-23 NOTE — ED Provider Notes (Signed)
[facility] EMERGENCY DEPARTMENT Provider Note   CSN: [id number] Arrival date & time: 12/23/20  1728     History Chief Complaint  Patient presents with  . Flank Pain    [name] is a 45 y.o. female with PMH/o asthma, BV, HTN, kidney stones who presents for evaluation of right flank pain that began last night.  Patient reports she has had associated nausea/vomiting as well as hematuria, dysuria.  She states she has a history of kidney stones states this feels similar.  She reports that she has not had any fevers, chest pain, difficulty breathing.  She reports that she follows with [facility].  She has required lithotripsy, nephrostomy tubes, stenting previously.  She is not taking her medication for her symptoms.  The history is provided by the patient.       Past Medical History:  Diagnosis Date  . Asthma   . Bacterial vaginosis   . Candidal vaginitis   . Chronic UTI   . Complication of anesthesia   . Hypertension   . Kidney stones   . Ovarian cyst   . Pyelonephritis, chronic     Patient Active Problem List   Diagnosis Date Noted  . Vomiting 04/21/2012  . Pyelonephritis 04/20/2012  . HTN (hypertension) 04/20/2012  . Asthma 04/20/2012    Past Surgical History:  Procedure Laterality Date  . CYSTOSCOPY  2001-2012   "total of 11; for chronic chronic pyelonephritis"  . LITHOTRIPSY  2000's  . nephrostomy    . TUBAL LIGATION  2002     OB History   No obstetric history on file.     Family History  Problem Relation Age of Onset  . HIV/AIDS Mother   . Hypertension Father     Social History   Tobacco Use  . Smoking status: Current Every Day Smoker    Packs/day: 0.25    Years: 18.00    Pack years: 4.50    Types: Cigarettes  . Smokeless tobacco: Never Used  Substance Use Topics  . Alcohol use: No  . Drug use: No    Home Medications Prior to Admission medications   Medication Sig Start Date End Date Taking? Authorizing Provider   albuterol (PROVENTIL HFA;VENTOLIN HFA) 108 (90 BASE) MCG/ACT inhaler Inhale 2 puffs into the lungs every 6 (six) hours as needed for wheezing. For wheezing   Yes [provider]  albuterol (VENTOLIN HFA) 108 (90 Base) MCG/ACT inhaler Inhale 2 puffs into the lungs 4 (four) times daily as needed for wheezing or shortness of breath. 01/26/12  Yes [provider]  hydrochlorothiazide (HYDRODIURIL) 12.5 MG tablet Take 1 tablet by mouth daily. 09/27/20  Yes [provider]  metoprolol succinate (TOPROL-XL) 25 MG 24 hr tablet Take 1 tablet by mouth daily. 11/21/16  Yes [provider]  chlorpheniramine-HYDROcodone (TUSSIONEX PENNKINETIC ER) 10-8 MG/5ML SUER Take 5 mLs by mouth every 12 (twelve) hours as needed. 08/27/18   Molpus, John, MD  fluconazole (DIFLUCAN) 150 MG tablet Take 1 tablet (150 mg total) by mouth daily. Please take one tab now and one after completing antibiotic therapy 09/12/17   Hedges, Tinnie Gens, PA-C  guaiFENesin (MUCINEX) 600 MG 12 hr tablet Take 1,200 mg by mouth 2 (two) times daily.    [provider]  HYDROcodone-acetaminophen (NORCO/VICODIN) 5-325 MG tablet Take 1 tablet by mouth every 6 (six) hours as needed for moderate pain. 09/15/17   Meredeth Ide, MD  lisinopril-hydrochlorothiazide (PRINZIDE,ZESTORETIC) 20-12.5 MG tablet Take 1 tablet by  mouth daily. 09/11/17   [provider]  ranitidine (ZANTAC) 150 MG capsule Take 1 capsule (150 mg total) by mouth daily. 06/29/14   Joycie Peekartner, Benjamin, PA-C    Allergies    Flagyl [metronidazole hcl], Ketorolac tromethamine, Metoclopramide hcl, Percocet [oxycodone-acetaminophen], Barium sulfate, and Zofran [ondansetron hcl]  Review of Systems   Review of Systems  Constitutional: Negative for fever.  Respiratory: Negative for cough and shortness of breath.   Cardiovascular: Negative for chest pain.  Gastrointestinal: Positive for abdominal pain, nausea and vomiting.  Genitourinary: Positive  for dysuria, flank pain and hematuria.  Neurological: Negative for headaches.  All other systems reviewed and are negative.   Physical Exam Updated Vital Signs BP (!) 160/95   Pulse 75   Temp 98.7 F (37.1 C) (Oral)   Resp 18   Ht 5\' 5"  (1.651 m)   Wt 112.5 kg   LMP 12/08/2020   SpO2 96%   BMI 41.27 kg/m   Physical Exam Vitals and nursing note reviewed.  Constitutional:      Appearance: Normal appearance. She is well-developed and well-nourished.     Comments: Appears uncomfortable but no acute distress   HENT:     Head: Normocephalic and atraumatic.     Mouth/Throat:     Mouth: Oropharynx is clear and moist and mucous membranes are normal.  Eyes:     General: Lids are normal.     Extraocular Movements: EOM normal.     Conjunctiva/sclera: Conjunctivae normal.     Pupils: Pupils are equal, round, and reactive to light.  Cardiovascular:     Rate and Rhythm: Normal rate and regular rhythm.     Pulses: Normal pulses.     Heart sounds: Normal heart sounds. No murmur heard. No friction rub. No gallop.   Pulmonary:     Effort: Pulmonary effort is normal.     Breath sounds: Normal breath sounds.  Abdominal:     Palpations: Abdomen is soft. Abdomen is not rigid.     Tenderness: There is abdominal tenderness in the right lower quadrant. There is right CVA tenderness. There is no left CVA tenderness or guarding.     Comments: Abdomen is soft, non-distended. Tenderness noted mid right abdomen.  Right-sided CVA tenderness noted.  Musculoskeletal:        General: Normal range of motion.     Cervical back: Full passive range of motion without pain.  Skin:    General: Skin is warm and dry.     Capillary Refill: Capillary refill takes less than 2 seconds.  Neurological:     Mental Status: She is alert and oriented to person, place, and time.  Psychiatric:        Mood and Affect: Mood and affect normal.        Speech: Speech normal.     ED Results / Procedures / Treatments    Labs (all labs ordered are listed, but only abnormal results are displayed) Labs Reviewed  URINALYSIS, ROUTINE W REFLEX MICROSCOPIC - Abnormal; Notable for the following components:      Result Value   APPearance CLOUDY (*)    Hgb urine dipstick LARGE (*)    Protein, ur 100 (*)    All other components within normal limits  URINALYSIS, MICROSCOPIC (REFLEX) - Abnormal; Notable for the following components:   Bacteria, UA FEW (*)    All other components within normal limits  COMPREHENSIVE METABOLIC PANEL - Abnormal; Notable for the following components:   Glucose, Bld 107 (*)  Calcium 8.7 (*)    All other components within normal limits  CBC WITH DIFFERENTIAL/PLATELET - Abnormal; Notable for the following components:   WBC 11.8 (*)    RBC 3.76 (*)    Neutro Abs 9.5 (*)    All other components within normal limits  PREGNANCY, URINE    EKG None  Radiology CT Renal Stone Study  Result Date: 12/23/2020 CLINICAL DATA:  Right-sided flank pain for 1 day EXAM: CT ABDOMEN AND PELVIS WITHOUT CONTRAST TECHNIQUE: Multidetector CT imaging of the abdomen and pelvis was performed following the standard protocol without IV contrast. COMPARISON:  01/14/2020 FINDINGS: Lower chest: No acute abnormality. Hepatobiliary: No focal liver abnormality is seen. No gallstones, gallbladder wall thickening, or biliary dilatation. Pancreas: Unremarkable. No pancreatic ductal dilatation or surrounding inflammatory changes. Spleen: Normal in size without focal abnormality. Adrenals/Urinary Tract: Adrenal glands are within normal limits. Left kidney shows no renal calculi or obstructive changes. The overall appearance is similar to that seen on the prior exam. The right kidney demonstrates cortical calcifications along the upper pole with evidence of hydronephrosis and hydroureter which extends to the level of the urinary bladder. Multiple nonobstructing renal stones are noted within the right kidney. The largest of  these lies in the lower pole measuring 8 mm in dimension. A few scattered punctate calcifications are noted along the right ureter although no definitive intraureteral stone is seen. Bladder is decompressed. Stomach/Bowel: Colon shows scattered diverticular change without evidence of diverticulitis. The appendix is within normal limits. No obstructive or inflammatory changes in the colon are noted. Small bowel and stomach are within normal limits. Vascular/Lymphatic: Aortic atherosclerosis. No enlarged abdominal or pelvic lymph nodes. Reproductive: Uterus and bilateral adnexa are unremarkable. Other: No abdominal wall hernia or abnormality. No abdominopelvic ascites. Musculoskeletal: No acute or significant osseous findings. IMPRESSION: Right-sided hydronephrosis and hydroureter which extends to the level of the ureterovesical junction without definitive obstructing stone. These changes could be related to edema from recently passed stone given the patient's extended clinical history. Multiple nonobstructing renal calculi are noted on the right. No other focal abnormality is seen. Electronically Signed   By: Alcide Clever M.D.   On: 12/23/2020 18:42    Procedures Procedures   Medications Ordered in ED Medications  fentaNYL (SUBLIMAZE) injection 50 mcg (50 mcg Intravenous Given 12/23/20 2002)  promethazine (PHENERGAN) injection 25 mg (25 mg Intravenous Given 12/23/20 2002)  HYDROmorphone (DILAUDID) injection 0.5 mg (0.5 mg Intravenous Given 12/23/20 2135)    ED Course  I have reviewed the triage vital signs and the nursing notes.  Pertinent labs & imaging results that were available during my care of the patient were reviewed by me and considered in my medical decision making (see chart for details).    MDM Rules/Calculators/A&P                          45 year old female who presents for evaluation of right-sided flank pain, nausea/vomiting, urinary complaints.  History of kidney stones and states  this feels similar.  On initial arrival, she is afebrile, uncomfortable but no acute distress.  She is slightly hypertensive.  Vital signs are stable.  She has right-sided CVA tenderness as well as some right sided abdominal tenderness.  Concern for kidney stone.  Will check urine, lab work.  Urine pregnancy negative.  UA shows large hemoglobin.  CBC shows slight leukocytosis of 11.8.  CMP shows normal BUN and creatinine.  CT scan shows right-sided hydronephrosis  and hydroureter which extends a level of the UVJ without definite obstructing stones.  She does have some mild calcifications noted in the ureter.  They do state these changes could be related from recently passed stone.  Patient with no infectious urine.  Do not suspect this is pyelonephritis.  Reevaluation.  Patient states she feels somewhat better.  She still thinks mild pain but states she is improved from when she first came in here.  I discussed with her that her symptoms could be the result of a recently passed stone.  Will give additional analgesics and reassess.  Re-evaluation.  Patient reports feeling better.  She is sitting up and looks much more comfortable.  Repeat abdominal exam shows improvement in pain.  She has been able to tolerate ginger ale without any difficulty.  Patient states she feels comfortable going home.  I discussed with patient regarding following up with her urologist given that she most likely recently passed a kidney stone. At this time, patient exhibits no emergent life-threatening condition that require further evaluation in ED. Patient had ample opportunity for questions and discussion. All patient's questions were answered with full understanding. Strict return precautions discussed. Patient expresses understanding and agreement to plan.   Portions of this note were generated with Scientist, clinical (histocompatibility and immunogenetics). Dictation errors may occur despite best attempts at proofreading.   Final Clinical Impression(s) / ED  Diagnoses Final diagnoses:  Kidney stone    Rx / DC Orders ED Discharge Orders    None       Rosana Hoes 12/23/20 2227    Tilden Fossa, MD 12/24/20 1439

## 2020-12-24 ENCOUNTER — Encounter (HOSPITAL_BASED_OUTPATIENT_CLINIC_OR_DEPARTMENT_OTHER): Payer: Self-pay | Admitting: Emergency Medicine

## 2020-12-24 ENCOUNTER — Emergency Department (HOSPITAL_BASED_OUTPATIENT_CLINIC_OR_DEPARTMENT_OTHER)
Admission: EM | Admit: 2020-12-24 | Discharge: 2020-12-25 | Disposition: A | Discharge status: Home / Self Care | Payer: Medicaid Other | Attending: Emergency Medicine | Admitting: Emergency Medicine

## 2020-12-24 ENCOUNTER — Emergency Department (HOSPITAL_BASED_OUTPATIENT_CLINIC_OR_DEPARTMENT_OTHER): Payer: Medicaid Other

## 2020-12-24 ENCOUNTER — Other Ambulatory Visit: Payer: Self-pay

## 2020-12-24 DIAGNOSIS — I10 Essential (primary) hypertension: Secondary | ICD-10-CM | POA: Insufficient documentation

## 2020-12-24 DIAGNOSIS — Z6841 Body Mass Index (BMI) 40.0 and over, adult: Secondary | ICD-10-CM | POA: Diagnosis not present

## 2020-12-24 DIAGNOSIS — E669 Obesity, unspecified: Secondary | ICD-10-CM | POA: Insufficient documentation

## 2020-12-24 DIAGNOSIS — Z79899 Other long term (current) drug therapy: Secondary | ICD-10-CM | POA: Insufficient documentation

## 2020-12-24 DIAGNOSIS — R000 Tachycardia, unspecified: Secondary | ICD-10-CM | POA: Insufficient documentation

## 2020-12-24 DIAGNOSIS — F17210 Nicotine dependence, cigarettes, uncomplicated: Secondary | ICD-10-CM | POA: Diagnosis not present

## 2020-12-24 DIAGNOSIS — R0602 Shortness of breath: Secondary | ICD-10-CM | POA: Insufficient documentation

## 2020-12-24 DIAGNOSIS — N12 Tubulo-interstitial nephritis, not specified as acute or chronic: Secondary | ICD-10-CM | POA: Insufficient documentation

## 2020-12-24 DIAGNOSIS — J45909 Unspecified asthma, uncomplicated: Secondary | ICD-10-CM | POA: Diagnosis not present

## 2020-12-24 DIAGNOSIS — R109 Unspecified abdominal pain: Secondary | ICD-10-CM | POA: Diagnosis present

## 2020-12-24 LAB — CBC WITH DIFFERENTIAL/PLATELET
Abs Immature Granulocytes: 0.06 10*3/uL (ref 0.00–0.07)
Basophils Absolute: 0 10*3/uL (ref 0.0–0.1)
Basophils Relative: 0 %
Eosinophils Absolute: 0 10*3/uL (ref 0.0–0.5)
Eosinophils Relative: 0 %
HCT: 34.8 % — ABNORMAL LOW (ref 36.0–46.0)
Hemoglobin: 11.6 g/dL — ABNORMAL LOW (ref 12.0–15.0)
Immature Granulocytes: 1 %
Lymphocytes Relative: 9 %
Lymphs Abs: 1.2 10*3/uL (ref 0.7–4.0)
MCH: 32.3 pg (ref 26.0–34.0)
MCHC: 33.3 g/dL (ref 30.0–36.0)
MCV: 96.9 fL (ref 80.0–100.0)
Monocytes Absolute: 1 10*3/uL (ref 0.1–1.0)
Monocytes Relative: 8 %
Neutro Abs: 10.4 10*3/uL — ABNORMAL HIGH (ref 1.7–7.7)
Neutrophils Relative %: 82 %
Platelets: 254 10*3/uL (ref 150–400)
RBC: 3.59 MIL/uL — ABNORMAL LOW (ref 3.87–5.11)
RDW: 14.2 % (ref 11.5–15.5)
WBC: 12.7 10*3/uL — ABNORMAL HIGH (ref 4.0–10.5)
nRBC: 0 % (ref 0.0–0.2)

## 2020-12-24 MED ORDER — ACETAMINOPHEN 500 MG PO TABS
500.0000 mg | ORAL_TABLET | Freq: Once | ORAL | Status: AC
  Administered 2020-12-24: 500 mg via ORAL
  Filled 2020-12-24: qty 1

## 2020-12-24 NOTE — ED Triage Notes (Addendum)
Seen yesterday due to right side pain. Still having pain. Has pain when taking deep breath vitals elevated.

## 2020-12-24 NOTE — ED Notes (Signed)
Per patient, took 650 tylenol at approximately 2036.

## 2020-12-25 ENCOUNTER — Emergency Department (HOSPITAL_BASED_OUTPATIENT_CLINIC_OR_DEPARTMENT_OTHER): Payer: Medicaid Other

## 2020-12-25 LAB — COMPREHENSIVE METABOLIC PANEL
ALT: 11 U/L (ref 0–44)
AST: 15 U/L (ref 15–41)
Albumin: 3.7 g/dL (ref 3.5–5.0)
Alkaline Phosphatase: 51 U/L (ref 38–126)
Anion gap: 9 (ref 5–15)
BUN: 8 mg/dL (ref 6–20)
CO2: 24 mmol/L (ref 22–32)
Calcium: 8.4 mg/dL — ABNORMAL LOW (ref 8.9–10.3)
Chloride: 101 mmol/L (ref 98–111)
Creatinine, Ser: 1.01 mg/dL — ABNORMAL HIGH (ref 0.44–1.00)
GFR, Estimated: >60 mL/min (ref >60)
Glucose, Bld: 131 mg/dL — ABNORMAL HIGH (ref 70–99)
Potassium: 3.1 mmol/L — ABNORMAL LOW (ref 3.5–5.1)
Sodium: 134 mmol/L — ABNORMAL LOW (ref 135–145)
Total Bilirubin: 0.8 mg/dL (ref 0.3–1.2)
Total Protein: 7 g/dL (ref 6.5–8.1)

## 2020-12-25 LAB — URINALYSIS, ROUTINE W REFLEX MICROSCOPIC
Bilirubin Urine: NEGATIVE
Glucose, UA: NEGATIVE mg/dL
Ketones, ur: NEGATIVE mg/dL
Nitrite: NEGATIVE
Protein, ur: NEGATIVE mg/dL
Specific Gravity, Urine: 1.015 (ref 1.005–1.030)
pH: 7.5 (ref 5.0–8.0)

## 2020-12-25 LAB — PREGNANCY, URINE: Preg Test, Ur: NEGATIVE

## 2020-12-25 LAB — PROTIME-INR
INR: 1.2 (ref 0.8–1.2)
Prothrombin Time: 14.6 seconds (ref 11.4–15.2)

## 2020-12-25 LAB — URINALYSIS, MICROSCOPIC (REFLEX)

## 2020-12-25 LAB — LACTIC ACID, PLASMA: Lactic Acid, Venous: 1.1 mmol/L (ref 0.5–1.9)

## 2020-12-25 MED ORDER — CEPHALEXIN 500 MG PO CAPS: 500.0000 mg | ORAL_CAPSULE | Freq: Three times a day (TID) | ORAL | 0 refills | Status: DC

## 2020-12-25 MED ORDER — SODIUM CHLORIDE 0.9 % IV BOLUS
1000.0000 mL | Freq: Once | INTRAVENOUS | Status: AC
  Administered 2020-12-25: 1000 mL via INTRAVENOUS

## 2020-12-25 MED ORDER — MORPHINE SULFATE (PF) 4 MG/ML IV SOLN
4.0000 mg | Freq: Once | INTRAVENOUS | Status: AC
  Administered 2020-12-25: 4 mg via INTRAVENOUS
  Filled 2020-12-25: qty 1

## 2020-12-25 MED ORDER — HYDROCODONE-ACETAMINOPHEN 5-325 MG PO TABS: 2.0000 | ORAL_TABLET | ORAL | 0 refills | Status: DC | PRN

## 2020-12-25 MED ORDER — IBUPROFEN 400 MG PO TABS
600.0000 mg | ORAL_TABLET | Freq: Once | ORAL | Status: AC
  Administered 2020-12-25: 600 mg via ORAL
  Filled 2020-12-25: qty 1

## 2020-12-25 MED ORDER — SODIUM CHLORIDE 0.9 % IV SOLN
1.0000 g | Freq: Once | INTRAVENOUS | Status: AC
  Administered 2020-12-25: 1 g via INTRAVENOUS
  Filled 2020-12-25: qty 10

## 2020-12-25 NOTE — ED Provider Notes (Signed)
[facility] EMERGENCY DEPARTMENT Provider Note   CSN: [id number] Arrival date & time: 12/24/20  2218     History Chief Complaint  Patient presents with  . Shortness of Breath    Nancy Axonakenya L Belli is a 45 y.o. female.  HPI     This a 45 year old female with a history of UTI, hypertension, kidney stones who presents with persistent right flank pain and fever.  Patient was seen and evaluated 24 hours ago for the same.  At that time she had some right-sided hydronephrosis and hydroureter but no obvious obstructing stone.  She was not febrile at that time and urinalysis was reassuring.  Patient reports that she has developed fevers at home.  She reports feeling significantly chilled.  She has right flank pain that does not radiate.  She rates her pain 8 out of 10.  She took Tylenol at 8 PM with some relief.  She has not noted any dysuria.  No cough or upper respiratory symptoms.  Past Medical History:  Diagnosis Date  . Asthma   . Bacterial vaginosis   . Candidal vaginitis   . Chronic UTI   . Complication of anesthesia   . Hypertension   . Kidney stones   . Ovarian cyst   . Pyelonephritis, chronic     Patient Active Problem List   Diagnosis Date Noted  . Vomiting 04/21/2012  . Pyelonephritis 04/20/2012  . HTN (hypertension) 04/20/2012  . Asthma 04/20/2012    Past Surgical History:  Procedure Laterality Date  . CYSTOSCOPY  2001-2012   "total of 11; for chronic chronic pyelonephritis"  . LITHOTRIPSY  2000's  . nephrostomy    . TUBAL LIGATION  2002     OB History   No obstetric history on file.     Family History  Problem Relation Age of Onset  . HIV/AIDS Mother   . Hypertension Father     Social History   Tobacco Use  . Smoking status: Current Every Day Smoker    Packs/day: 0.25    Years: 18.00    Pack years: 4.50    Types: Cigarettes  . Smokeless tobacco: Never Used  Substance Use Topics  . Alcohol use: No  . Drug use: No    Home  Medications Prior to Admission medications   Medication Sig Start Date End Date Taking? Authorizing Provider  cephALEXin (KEFLEX) 500 MG capsule Take 1 capsule (500 mg total) by mouth 3 (three) times daily. 12/25/20  Yes Azayla Polo, Mayer Maskerourtney F, MD  HYDROcodone-acetaminophen (NORCO/VICODIN) 5-325 MG tablet Take 2 tablets by mouth every 4 (four) hours as needed. 12/25/20  Yes Bettie Capistran, Mayer Maskerourtney F, MD  albuterol (PROVENTIL HFA;VENTOLIN HFA) 108 (90 BASE) MCG/ACT inhaler Inhale 2 puffs into the lungs every 6 (six) hours as needed for wheezing. For wheezing    [provider]  albuterol (VENTOLIN HFA) 108 (90 Base) MCG/ACT inhaler Inhale 2 puffs into the lungs 4 (four) times daily as needed for wheezing or shortness of breath. 01/26/12   [provider]  chlorpheniramine-HYDROcodone (TUSSIONEX PENNKINETIC ER) 10-8 MG/5ML SUER Take 5 mLs by mouth every 12 (twelve) hours as needed. 08/27/18   Molpus, John, MD  fluconazole (DIFLUCAN) 150 MG tablet Take 1 tablet (150 mg total) by mouth daily. Please take one tab now and one after completing antibiotic therapy 09/12/17   Hedges, Tinnie GensJeffrey, PA-C  guaiFENesin (MUCINEX) 600 MG 12 hr tablet Take 1,200 mg by mouth 2 (two) times daily.    [provider]  hydrochlorothiazide (HYDRODIURIL) 12.5 MG tablet Take 1 tablet by mouth daily. 09/27/20   [provider]  lisinopril-hydrochlorothiazide (PRINZIDE,ZESTORETIC) 20-12.5 MG tablet Take 1 tablet by mouth daily. 09/11/17   [provider]  metoprolol succinate (TOPROL-XL) 25 MG 24 hr tablet Take 1 tablet by mouth daily. 11/21/16   [provider]  ranitidine (ZANTAC) 150 MG capsule Take 1 capsule (150 mg total) by mouth daily. 06/29/14   Joycie Peek, PA-C    Allergies    Flagyl [metronidazole hcl], Ketorolac tromethamine, Metoclopramide hcl, Percocet [oxycodone-acetaminophen], Barium sulfate, and Zofran [ondansetron hcl]  Review of Systems   Review of Systems   Constitutional: Positive for chills and fever.  Respiratory: Negative for shortness of breath.   Cardiovascular: Negative for chest pain.  Gastrointestinal: Positive for nausea. Negative for abdominal pain.  Genitourinary: Positive for flank pain. Negative for dysuria.  All other systems reviewed and are negative.   Physical Exam Updated Vital Signs BP (!) 163/95 (BP Location: Right Arm)   Pulse 99   Temp 100.3 F (37.9 C) (Oral)   Resp 19   Ht 1.651 m (5\' 5" )   Wt 112.9 kg   LMP 12/08/2020   SpO2 92%   BMI 41.44 kg/m   Physical Exam Vitals and nursing note reviewed.  Constitutional:      Appearance: She is well-developed and well-nourished. She is obese. She is not ill-appearing.  HENT:     Head: Normocephalic and atraumatic.  Eyes:     Pupils: Pupils are equal, round, and reactive to light.  Cardiovascular:     Rate and Rhythm: Regular rhythm. Tachycardia present.     Heart sounds: Normal heart sounds.  Pulmonary:     Effort: Pulmonary effort is normal. No respiratory distress.     Breath sounds: No wheezing.  Abdominal:     General: Bowel sounds are normal.     Palpations: Abdomen is soft.     Tenderness: There is no guarding or rebound.     Comments: Right-sided CVA tenderness, right lower quadrant and suprapubic tenderness to palpation  Musculoskeletal:     Cervical back: Neck supple.  Skin:    General: Skin is warm and dry.  Neurological:     Mental Status: She is alert and oriented to person, place, and time.  Psychiatric:        Mood and Affect: Mood and affect and mood normal.     ED Results / Procedures / Treatments   Labs (all labs ordered are listed, but only abnormal results are displayed) Labs Reviewed  COMPREHENSIVE METABOLIC PANEL - Abnormal; Notable for the following components:      Result Value   Sodium 134 (*)    Potassium 3.1 (*)    Glucose, Bld 131 (*)    Creatinine, Ser 1.01 (*)    Calcium 8.4 (*)    All other components within  normal limits  CBC WITH DIFFERENTIAL/PLATELET - Abnormal; Notable for the following components:   WBC 12.7 (*)    RBC 3.59 (*)    Hemoglobin 11.6 (*)    HCT 34.8 (*)    Neutro Abs 10.4 (*)    All other components within normal limits  URINALYSIS, ROUTINE W REFLEX MICROSCOPIC - Abnormal; Notable for the following components:   APPearance CLOUDY (*)    Hgb urine dipstick SMALL (*)    Leukocytes,Ua LARGE (*)    All other components within normal limits  URINALYSIS, MICROSCOPIC (REFLEX) - Abnormal; Notable for the following components:  Bacteria, UA FEW (*)    All other components within normal limits  CULTURE, BLOOD (ROUTINE X 2)  CULTURE, BLOOD (ROUTINE X 2)  URINE CULTURE  LACTIC ACID, PLASMA  PROTIME-INR  PREGNANCY, URINE  LACTIC ACID, PLASMA    EKG EKG Interpretation  Date/Time:  Tuesday December 24 2020 22:28:51 EST Ventricular Rate:  128 PR Interval:  132 QRS Duration: 70 QT Interval:  292 QTC Calculation: 426 R Axis:   70 Text Interpretation: Sinus tachycardia Nonspecific T wave abnormality Abnormal ECG Confirmed by Ross Marcus ([id number]) on 12/25/2020 12:36:36 AM   Radiology DG Chest 2 View  Result Date: 12/25/2020 CLINICAL DATA:  Right-sided pain difficulty breathing EXAM: CHEST - 2 VIEW COMPARISON:  None. FINDINGS: Shallow lung inflation with by basilar linear opacities. Normal pleural spaces and cardiomediastinal contours. IMPRESSION: Shallow lung inflation with bibasilar opacities, likely atelectasis. Electronically Signed   By: Deatra Robinson M.D.   On: 12/25/2020 00:33   CT Renal Stone Study  Result Date: 12/23/2020 CLINICAL DATA:  Right-sided flank pain for 1 day EXAM: CT ABDOMEN AND PELVIS WITHOUT CONTRAST TECHNIQUE: Multidetector CT imaging of the abdomen and pelvis was performed following the standard protocol without IV contrast. COMPARISON:  01/14/2020 FINDINGS: Lower chest: No acute abnormality. Hepatobiliary: No focal liver abnormality is seen. No  gallstones, gallbladder wall thickening, or biliary dilatation. Pancreas: Unremarkable. No pancreatic ductal dilatation or surrounding inflammatory changes. Spleen: Normal in size without focal abnormality. Adrenals/Urinary Tract: Adrenal glands are within normal limits. Left kidney shows no renal calculi or obstructive changes. The overall appearance is similar to that seen on the prior exam. The right kidney demonstrates cortical calcifications along the upper pole with evidence of hydronephrosis and hydroureter which extends to the level of the urinary bladder. Multiple nonobstructing renal stones are noted within the right kidney. The largest of these lies in the lower pole measuring 8 mm in dimension. A few scattered punctate calcifications are noted along the right ureter although no definitive intraureteral stone is seen. Bladder is decompressed. Stomach/Bowel: Colon shows scattered diverticular change without evidence of diverticulitis. The appendix is within normal limits. No obstructive or inflammatory changes in the colon are noted. Small bowel and stomach are within normal limits. Vascular/Lymphatic: Aortic atherosclerosis. No enlarged abdominal or pelvic lymph nodes. Reproductive: Uterus and bilateral adnexa are unremarkable. Other: No abdominal wall hernia or abnormality. No abdominopelvic ascites. Musculoskeletal: No acute or significant osseous findings. IMPRESSION: Right-sided hydronephrosis and hydroureter which extends to the level of the ureterovesical junction without definitive obstructing stone. These changes could be related to edema from recently passed stone given the patient's extended clinical history. Multiple nonobstructing renal calculi are noted on the right. No other focal abnormality is seen. Electronically Signed   By: Alcide Clever M.D.   On: 12/23/2020 18:42    Procedures Procedures  Angiocath insertion Performed by: Shon Baton  Consent: Verbal consent  obtained. Risks and benefits: risks, benefits and alternatives were discussed Time out: Immediately prior to procedure a "time out" was called to verify the correct patient, procedure, equipment, support staff and site/side marked as required.  Preparation: Patient was prepped and draped in the usual sterile fashion.  Vein Location: left basilic  Ultrasound Guided  Gauge: 20  Normal blood return and flush without difficulty Patient tolerance: Patient tolerated the procedure well with no immediate complications.  CRITICAL CARE Performed by: Shon Baton   Total critical care time: 31 minutes  Critical care time was exclusive of separately billable procedures  and treating other patients.  Critical care was necessary to treat or prevent imminent or life-threatening deterioration.  Critical care was time spent personally by me on the following activities: development of treatment plan with patient and/or surrogate as well as nursing, discussions with consultants, evaluation of patient's response to treatment, examination of patient, obtaining history from patient or surrogate, ordering and performing treatments and interventions, ordering and review of laboratory studies, ordering and review of radiographic studies, pulse oximetry and re-evaluation of patient's condition.   Medications Ordered in ED Medications  acetaminophen (TYLENOL) tablet 500 mg (500 mg Oral Given 12/24/20 2307)  sodium chloride 0.9 % bolus 1,000 mL (1,000 mLs Intravenous New Bag/Given 12/25/20 0114)  cefTRIAXone (ROCEPHIN) 1 g in sodium chloride 0.9 % 100 mL IVPB ( Intravenous Stopped 12/25/20 0145)  morphine 4 MG/ML injection 4 mg (4 mg Intravenous Given 12/25/20 0113)    ED Course  I have reviewed the triage vital signs and the nursing notes.  Pertinent labs & imaging results that were available during my care of the patient were reviewed by me and considered in my medical decision making (see chart for  details).    MDM Rules/Calculators/A&P                          Patient presents with ongoing right flank pain.  Now febrile to 103.  She is ill-appearing but nontoxic.  Vital signs notable for temperature of 103 and tachycardia.  Blood pressure is 163/95.  She has right flank tenderness right lower quadrant pain.  I have reviewed her CT scan and her urinalysis from her previous evaluation.  CT scan without obstructing stone.  Urinalysis did not show any obvious UTI.  Sepsis work-up initiated.  Lactate is normal.  She has a slightly more elevated white count at 12.7.  Mild metabolic derangements with sodium 134 potassium of 3.1.  Patient was given fluids.  Urinalysis today is positive for leukocytestrace, white cells and bacteria.  Culture was sent.  Given progression of symptoms, suspect pyelonephritis.  Patient was given IV Rocephin.  Lactate is normal.  She does not appear to be in severe sepsis or septic shock.  On recheck, she states she feels much better.  At this time, do not feel she needs repeat imaging.  She has had an appendectomy previously.  Feel her symptoms are most consistent with pyelonephritis.  Will discharge with antibiotics and pain medication.  Patient was given strict return precautions. Final Clinical Impression(s) / ED Diagnoses Final diagnoses:  Pyelonephritis    Rx / DC Orders ED Discharge Orders         Ordered    cephALEXin (KEFLEX) 500 MG capsule  3 times daily        12/25/20 0225    HYDROcodone-acetaminophen (NORCO/VICODIN) 5-325 MG tablet  Every 4 hours PRN        12/25/20 0225           Shon Baton, MD 12/25/20 0230

## 2020-12-25 NOTE — Discharge Instructions (Addendum)
You are seen today for fever and flank pain.  Your urine today shows evidence of a urinary tract infection.  This likely represents pyelonephritis.  Take antibiotics as prescribed.  Make sure that you are staying hydrated.  If you develop worsening pain, ongoing fevers, any new or worsening symptoms you should be reevaluated.

## 2020-12-26 LAB — URINE CULTURE

## 2020-12-30 LAB — CULTURE, BLOOD (ROUTINE X 2)
Culture: NO GROWTH
Special Requests: ADEQUATE

## 2023-02-08 ENCOUNTER — Other Ambulatory Visit: Payer: Self-pay | Admitting: Urology

## 2023-03-17 ENCOUNTER — Encounter (HOSPITAL_COMMUNITY): Payer: Self-pay

## 2023-03-23 ENCOUNTER — Encounter (HOSPITAL_COMMUNITY): Payer: Managed Care, Other (non HMO)

## 2023-03-24 NOTE — Progress Notes (Addendum)
Anesthesia Review:  PCP: [facility] [employer] [facility]  Cardiologist : none  Chest x-ray : EKG : 10/08/22 [facility]  and 03/30/23  Echo : Stress test: Cardiac Cath :  Activity level: can do a flight of stairs without difficutly  Sleep Study/ CPAP : none  Fasting Blood Sugar :      / Checks Blood Sugar -- times a day:   Blood Thinner/ Instructions /Last Dose: ASA / Instructions/ Last Dose :    In ED 10/07/22- [facility]   AT preop appt on 03/30/23 pt stated she was extremely nervous.  Pt and husband stated she had a panic attack at preop appt with DR  Winter.  PT took am blp meds.  Blood pressure was 143/100 in left upper arm and in left lower arm was 138/101.  PT denies any chest pain, shortness of breath, dizziness, headache or blurred vision. EKG done.   PT is a CMA .  Will keep a check on blood pressures at home and will notify PCP in regards to blood rpessures.  Shanda Bumps [facility] aware of above.  NO new orders given.   BMP done 03/30/23 with Potassium of 2.9- routed to DR Winter.  Shanda Bumps [facility] aware.

## 2023-03-25 NOTE — Patient Instructions (Signed)
SURGICAL WAITING ROOM VISITATION  Patients having surgery or a procedure may have no more than 2 support people in the waiting area - these visitors may rotate.    Children under the age of 38 must have an adult with them who is not the patient.  Due to an increase in RSV and influenza rates and associated hospitalizations, children ages 49 and under may not visit patients in [facility] hospitals.  If the patient needs to stay at the hospital during part of their recovery, the visitor guidelines for inpatient rooms apply. Pre-op nurse will coordinate an appropriate time for 1 support person to accompany patient in pre-op.  This support person may not rotate.    Please refer to the [facility] website for the visitor guidelines for Inpatients (after your surgery is over and you are in a regular room).       Your procedure is scheduled on:  04/09/23    Report to [facility] Main Entrance    Report to admitting at  1015 AM   Call this number if you have problems the morning of surgery [phone]   Do not eat food or drink liquids after  :After Midnight. Clear liquid diet the day before surgery.   Water Non-Citrus Juices (without pulp, NO RED-Apple, White grape, White cranberry) Black Coffee (NO MILK/CREAM OR CREAMERS, sugar ok)  Clear Tea (NO MILK/CREAM OR CREAMERS, sugar ok) regular and decaf                             Plain Jell-O (NO RED)                                           Fruit ices (not with fruit pulp, NO RED)                                     Popsicles (NO RED)                                                               Sports drinks like Gatorade (NO RED)                            If you have questions, please contact your surgeon's office.     Oral Hygiene is also important to reduce your risk of infection.                                    Remember - BRUSH YOUR TEETH THE MORNING OF SURGERY WITH YOUR REGULAR TOOTHPASTE  DENTURES WILL BE  REMOVED PRIOR TO SURGERY PLEASE DO NOT APPLY "Poly grip" OR ADHESIVES!!!   Do NOT smoke after Midnight   Take these medicines the morning of surgery with A SIP OF WATER:  inhalers as usual and bring, toprol   DO NOT TAKE ANY ORAL DIABETIC MEDICATIONS DAY OF YOUR SURGERY  Bring CPAP mask and tubing day of  surgery.                              You may not have any metal on your body including hair pins, jewelry, and body piercing             Do not wear make-up, lotions, powders, perfumes/cologne, or deodorant  Do not wear nail polish including gel and S&S, artificial/acrylic nails, or any other type of covering on natural nails including finger and toenails. If you have artificial nails, gel coating, etc. that needs to be removed by a nail salon please have this removed prior to surgery or surgery may need to be canceled/ delayed if the surgeon/ anesthesia feels like they are unable to be safely monitored.   Do not shave  48 hours prior to surgery.               Men may shave face and neck.   Do not bring valuables to the hospital. [facility] IS NOT             RESPONSIBLE   FOR VALUABLES.   Contacts, glasses, dentures or bridgework may not be worn into surgery.   Bring small overnight bag day of surgery.   DO NOT BRING YOUR HOME MEDICATIONS TO THE HOSPITAL. PHARMACY WILL DISPENSE MEDICATIONS LISTED ON YOUR MEDICATION LIST TO YOU DURING YOUR ADMISSION IN THE HOSPITAL!    Patients discharged on the day of surgery will not be allowed to drive home.  Someone NEEDS to stay with you for the first 24 hours after anesthesia.   Special Instructions: Bring a copy of your healthcare power of attorney and living will documents the day of surgery if you haven't scanned them before.              Please read over the following fact sheets you were given: IF YOU HAVE QUESTIONS ABOUT YOUR PRE-OP INSTRUCTIONS PLEASE CALL [phone]   If you received a COVID test during your pre-op visit  it is  requested that you wear a mask when out in public, stay away from anyone that may not be feeling well and notify your surgeon if you develop symptoms. If you test positive for Covid or have been in contact with anyone that has tested positive in the last 10 days please notify you surgeon.    [facility] - Preparing for Surgery Before surgery, you can play an important role.  Because skin is not sterile, your skin needs to be as free of germs as possible.  You can reduce the number of germs on your skin by washing with CHG (chlorahexidine gluconate) soap before surgery.  CHG is an antiseptic cleaner which kills germs and bonds with the skin to continue killing germs even after washing. Please DO NOT use if you have an allergy to CHG or antibacterial soaps.  If your skin becomes reddened/irritated stop using the CHG and inform your nurse when you arrive at Short Stay. Do not shave (including legs and underarms) for at least 48 hours prior to the first CHG shower.  You may shave your face/neck. Please follow these instructions carefully:  1.  Shower with CHG Soap the night before surgery and the  morning of Surgery.  2.  If you choose to wash your hair, wash your hair first as usual with your  normal  shampoo.  3.  After you shampoo, rinse your hair and body thoroughly to  remove the  shampoo.                           4.  Use CHG as you would any other liquid soap.  You can apply chg directly  to the skin and wash                       Gently with a scrungie or clean washcloth.  5.  Apply the CHG Soap to your body ONLY FROM THE NECK DOWN.   Do not use on face/ open                           Wound or open sores. Avoid contact with eyes, ears mouth and genitals (private parts).                       Wash face,  Genitals (private parts) with your normal soap.             6.  Wash thoroughly, paying special attention to the area where your surgery  will be performed.  7.  Thoroughly rinse your body with warm  water from the neck down.  8.  DO NOT shower/wash with your normal soap after using and rinsing off  the CHG Soap.                9.  Pat yourself dry with a clean towel.            10.  Wear clean pajamas.            11.  Place clean sheets on your bed the night of your first shower and do not  sleep with pets. Day of Surgery : Do not apply any lotions/deodorants the morning of surgery.  Please wear clean clothes to the hospital/surgery center.  FAILURE TO FOLLOW THESE INSTRUCTIONS MAY RESULT IN THE CANCELLATION OF YOUR SURGERY PATIENT SIGNATURE_________________________________  NURSE SIGNATURE__________________________________  ________________________________________________________________________

## 2023-03-30 ENCOUNTER — Encounter (HOSPITAL_COMMUNITY)
Admission: RE | Admit: 2023-03-30 | Discharge: 2023-03-30 | Disposition: A | Discharge status: Home / Self Care | Payer: Managed Care, Other (non HMO) | Source: Ambulatory Visit | Attending: Urology | Admitting: Urology

## 2023-03-30 ENCOUNTER — Encounter (HOSPITAL_COMMUNITY): Payer: Self-pay

## 2023-03-30 ENCOUNTER — Other Ambulatory Visit: Payer: Self-pay

## 2023-03-30 DIAGNOSIS — Z01818 Encounter for other preprocedural examination: Secondary | ICD-10-CM | POA: Diagnosis present

## 2023-03-30 HISTORY — DX: Personal history of urinary calculi: Z87.442

## 2023-03-30 LAB — TYPE AND SCREEN: ABO/RH(D): O POS

## 2023-03-30 LAB — BASIC METABOLIC PANEL
Anion gap: 13 (ref 5–15)
BUN: 14 mg/dL (ref 6–20)
CO2: 24 mmol/L (ref 22–32)
Calcium: 8.9 mg/dL (ref 8.9–10.3)
Chloride: 98 mmol/L (ref 98–111)
Creatinine, Ser: 1.1 mg/dL — ABNORMAL HIGH (ref 0.44–1.00)
GFR, Estimated: >60 mL/min (ref >60)
Glucose, Bld: 111 mg/dL — ABNORMAL HIGH (ref 70–99)
Potassium: 2.9 mmol/L — ABNORMAL LOW (ref 3.5–5.1)
Sodium: 135 mmol/L (ref 135–145)

## 2023-03-30 LAB — CBC
HCT: 36.9 % (ref 36.0–46.0)
Hemoglobin: 11.8 g/dL — ABNORMAL LOW (ref 12.0–15.0)
MCH: 30.3 pg (ref 26.0–34.0)
MCHC: 32 g/dL (ref 30.0–36.0)
MCV: 94.6 fL (ref 80.0–100.0)
Platelets: 329 10*3/uL (ref 150–400)
RBC: 3.9 MIL/uL (ref 3.87–5.11)
RDW: 14.1 % (ref 11.5–15.5)
WBC: 4.2 10*3/uL (ref 4.0–10.5)
nRBC: 0 % (ref 0.0–0.2)

## 2023-04-08 NOTE — H&P (Signed)
Office Visit Report     03/23/2023   --------------------------------------------------------------------------------   [name]  MRN: [id number]  DOB: [birth year], 47 year old Female  SSN:    PRIMARY CARE:     REFERRING:  Valor Quaintance A. Liliane Shi, MD  PROVIDER:  Rhoderick Moody, M.D.  TREATING:  Ulyses Amor, [place]  LOCATION:  [facility] [phone]     --------------------------------------------------------------------------------   CC/HPI: Pt presents today for pre-operative history and physical exam in anticipation of right robotic assisted lap radical nephrectomy by Dr. Liliane Shi on 04/09/23. She is doing well and is without complaint except for being nervous.   Pt denies F/C, HA, CP, SOB, N/V, diarrhea/constipation, back pain, flank pain, hematuria, and dysuria.     HX:   Kidney stones   [name] is a 47 year old female with a history of recurrent UTIs, kidney stones, s/p right PCNL and multiple right ureteroscopies and right renal atrophy (previously followed by Dr. Pete Glatter).   02/08/23: The patient presents today to establish care. She reports multiple UTIs per year, recurrent stones since age 44. She currently denies any flank pain, dysuria or hematuria. CT from March revealed right renal atrophy with multiple large renal stones along with hydronephrosis w/o evidence of an obstructing stone. Hx of ruptured appendicitis in 2019 and tubal ligation.     ALLERGIES: Flagyl - Hives Percocet - Hives Toradol - Hives, throat "itchy"     Notes: plastic tape itching and whelps   MEDICATIONS: Hydrochlorothiazide 25 mg tablet  Metoprolol Succinate 50 mg tablet, extended release 24 hr  Albuterol Sulfate Hfa 90 mcg hfa aerosol with adapter     GU PSH: None     PSH Notes: litho on right side several times, stints, tubule ligation   nephrostomy   ruptured appy--initally had drain then later had to have appendectomy   liposuction stomach 2023    NON-GU PSH: None   GU PMH: Atrophy of kidney - 03/05/2023, - 02/08/2023, Renal atrophy, right, - 2015 Flank Pain (Stable) - 02/25/2023, - 02/08/2023 Renal calculus - 02/25/2023, - 02/08/2023 Chronic tubulo-interstitial nephritis, 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 She has been evaluated and treated for this in [facility]. She was admitted to the hospital in 11/18 for right pyelonephritis however a culture done 2 days prior to her admission showed contamination only and her culture from admission was negative. She has a long history of right renal calculi seen on CT scans dating back a number of years with no left renal stones seen on her most recent scan in 11/18. Her right kidney is atrophic with a normal left kidney and a normal serum creatinine in 11/18 of 0.83.  During her last hospital admission Dr. Laverle Patter was contacted and his feeling was that she did not appear to have pyelonephritis but with continued chronic right flank pain and an atrophic kidney with multiple calculi his feeling was that the patient could possibly benefit from a right simple nephrectomy.     HTN     NON-GU PMH: Encounter for general adult medical examination without abnormal findings, Encounter for preventive health examination    FAMILY HISTORY: 1 Daughter - Daughter 3 Son's - Son   SOCIAL HISTORY: Marital Status: Married Preferred Language: English; Ethnicity: Not Hispanic Or Latino; Race: Black or African American Current Smoking Status: Patient does not smoke anymore.   Tobacco Use  Assessment Completed: Used Tobacco in last 30 days? Has never drank.  Does not use drugs. Drinks 2 caffeinated drinks per day. Has had a blood transfusion.     Notes: Blood transfusion many years ago for severe anemia    REVIEW OF SYSTEMS:    GU Review Female:   Patient denies frequent urination,  hard to postpone urination, burning /pain with urination, get up at night to urinate, leakage of urine, stream starts and stops, trouble starting your stream, have to strain to urinate, and being pregnant.  Gastrointestinal (Upper):   Patient denies nausea, vomiting, and indigestion/ heartburn.  Gastrointestinal (Lower):   Patient denies diarrhea and constipation.  Constitutional:   Patient denies fever, night sweats, weight loss, and fatigue.  Skin:   Patient denies skin rash/ lesion and itching.  Eyes:   Patient denies blurred vision and double vision.  Ears/ Nose/ Throat:   Patient denies sore throat and sinus problems.  Hematologic/Lymphatic:   Patient denies swollen glands and easy bruising.  Cardiovascular:   Patient denies leg swelling and chest pains.  Respiratory:   Patient denies cough and shortness of breath.  Endocrine:   Patient denies excessive thirst.  Musculoskeletal:   Patient denies joint pain and back pain.  Neurological:   Patient denies headaches and dizziness.  Psychologic:   Patient denies depression and anxiety.   VITAL SIGNS:      03/23/2023 02:21 PM  BP 130/78 mmHg  Pulse 81 /min  Temperature 96.4 F / 35.7 C   MULTI-SYSTEM PHYSICAL EXAMINATION:    Constitutional: Well-nourished. No physical deformities. Normally developed. Good grooming.  Neck: Neck symmetrical, not swollen. Normal tracheal position.  Respiratory: Normal breath sounds.   Cardiovascular: Regular rate and rhythm. No murmur, no gallop.   Lymphatic: No enlargement of neck, axillae, groin.  Skin: No paleness, no jaundice, no cyanosis. No lesion, no ulcer, no rash.  Neurologic / Psychiatric: Oriented to time, oriented to place, oriented to person. No depression, no anxiety, no agitation.  Gastrointestinal: No mass, no tenderness, no rigidity, obese abdomen.   Eyes: Normal conjunctivae. Normal eyelids.  Ears, Nose, Mouth, and Throat: Left ear no scars, no lesions, no masses. Right ear no scars, no  lesions, no masses. Nose no scars, no lesions, no masses. Normal hearing. Normal lips.  Musculoskeletal: Normal gait and station of head and neck.     Complexity of Data:  Records Review:   Previous Patient Records  Urine Test Review:   Urinalysis   03/23/23  Urinalysis  Urine Appearance Slightly Cloudy   Urine Color Yellow   Urine Glucose Neg mg/dL  Urine Bilirubin Neg mg/dL  Urine Ketones Neg mg/dL  Urine Specific Gravity 1.025   Urine Blood Neg ery/uL  Urine pH 6.0   Urine Protein Neg mg/dL  Urine Urobilinogen 1.0 mg/dL  Urine Nitrites Neg   Urine Leukocyte Esterase 1+ leu/uL  Urine WBC/hpf 6 - 10/hpf   Urine RBC/hpf NS (Not Seen)   Urine Epithelial Cells 0 - 5/hpf   Urine Bacteria Many (>50/hpf)   Urine Mucous Not Present   Urine Yeast NS (Not Seen)   Urine Trichomonas Not Present   Urine Cystals NS (Not Seen)   Urine Casts NS (Not Seen)   Urine Sperm Not Present    PROCEDURES:          Urinalysis w/Scope - 81001 Dipstick Dipstick Cont'd Micro  Color: Yellow Bilirubin: Neg mg/dL WBC/hpf: 6 - 40/JWJ  Appearance: Slightly Cloudy Ketones: Neg mg/dL RBC/hpf: NS (  Not Seen)  Specific Gravity: 1.025 Blood: Neg ery/uL Bacteria: Many (>50/hpf)  pH: 6.0 Protein: Neg mg/dL Cystals: NS (Not Seen)  Glucose: Neg mg/dL Urobilinogen: 1.0 mg/dL Casts: NS (Not Seen)    Nitrites: Neg Trichomonas: Not Present    Leukocyte Esterase: 1+ leu/uL Mucous: Not Present      Epithelial Cells: 0 - 5/hpf      Yeast: NS (Not Seen)      Sperm: Not Present    ASSESSMENT:      ICD-10 Details  1 GU:   Atrophy of kidney - N26.1    PLAN:           Orders Labs Urine Culture          Schedule Return Visit/Planned Activity: Keep Scheduled Appointment - Schedule Surgery          Document Letter(s):  Created for Patient: Clinical Summary         Notes:   There are no changes in the patients history or physical exam since last evaluation by Dr. Liliane Shi. Pt is scheduled to undergo RAL right  radical nephrectomy on 04/09/23.   Urine for culture to r/o infection   All pt's questions were answered to the best of my ability.     -The risks, benefits and alternative of robot-assisted laparoscopic RIGHT simple nephrectomy were discussed in detail including but not limited to: open conversion, infection of the skin/abdominal cavity, VTE, MI/CVA, lymphatic leak, injury to adjacent solid/hollow viscus organs, bleeding requiring a blood transfusion, catastrophic bleeding, hernia formation and other imponderables. The patient voices understanding and wishes to proceed.

## 2023-04-09 ENCOUNTER — Ambulatory Visit (HOSPITAL_BASED_OUTPATIENT_CLINIC_OR_DEPARTMENT_OTHER): Payer: Managed Care, Other (non HMO) | Admitting: Certified Registered"

## 2023-04-09 ENCOUNTER — Other Ambulatory Visit: Payer: Self-pay

## 2023-04-09 ENCOUNTER — Encounter (HOSPITAL_COMMUNITY): Payer: Self-pay | Admitting: Urology

## 2023-04-09 ENCOUNTER — Ambulatory Visit (HOSPITAL_COMMUNITY): Payer: Managed Care, Other (non HMO) | Admitting: Physician Assistant

## 2023-04-09 ENCOUNTER — Encounter (HOSPITAL_COMMUNITY)
Admission: AD | Disposition: A | Discharge status: Home / Self Care | Payer: Self-pay | Source: Ambulatory Visit | Attending: Urology

## 2023-04-09 ENCOUNTER — Inpatient Hospital Stay (HOSPITAL_COMMUNITY)
Admission: AD | Admit: 2023-04-09 | Discharge: 2023-04-13 | DRG: 660 | Disposition: A | Discharge status: Home / Self Care | Payer: Managed Care, Other (non HMO) | Source: Ambulatory Visit | Attending: Urology | Admitting: Urology

## 2023-04-09 DIAGNOSIS — I10 Essential (primary) hypertension: Secondary | ICD-10-CM | POA: Diagnosis not present

## 2023-04-09 DIAGNOSIS — G8929 Other chronic pain: Secondary | ICD-10-CM | POA: Diagnosis present

## 2023-04-09 DIAGNOSIS — Z87891 Personal history of nicotine dependence: Secondary | ICD-10-CM

## 2023-04-09 DIAGNOSIS — X509XXA Other and unspecified overexertion or strenuous movements or postures, initial encounter: Secondary | ICD-10-CM

## 2023-04-09 DIAGNOSIS — Z87442 Personal history of urinary calculi: Secondary | ICD-10-CM

## 2023-04-09 DIAGNOSIS — R110 Nausea: Secondary | ICD-10-CM | POA: Diagnosis present

## 2023-04-09 DIAGNOSIS — N261 Atrophy of kidney (terminal): Secondary | ICD-10-CM

## 2023-04-09 DIAGNOSIS — R059 Cough, unspecified: Secondary | ICD-10-CM | POA: Diagnosis not present

## 2023-04-09 DIAGNOSIS — T8131XA Disruption of external operation (surgical) wound, not elsewhere classified, initial encounter: Secondary | ICD-10-CM | POA: Diagnosis not present

## 2023-04-09 DIAGNOSIS — Z885 Allergy status to narcotic agent status: Secondary | ICD-10-CM

## 2023-04-09 DIAGNOSIS — Z87440 Personal history of urinary (tract) infections: Secondary | ICD-10-CM

## 2023-04-09 DIAGNOSIS — Z881 Allergy status to other antibiotic agents status: Secondary | ICD-10-CM

## 2023-04-09 DIAGNOSIS — N200 Calculus of kidney: Secondary | ICD-10-CM

## 2023-04-09 HISTORY — PX: ROBOT ASSISTED LAPAROSCOPIC NEPHRECTOMY: SHX5140

## 2023-04-09 LAB — HEMOGLOBIN AND HEMATOCRIT, BLOOD
HCT: 36.9 % (ref 36.0–46.0)
Hemoglobin: 11.3 g/dL — ABNORMAL LOW (ref 12.0–15.0)

## 2023-04-09 LAB — TYPE AND SCREEN: Antibody Screen: NEGATIVE

## 2023-04-09 LAB — POCT PREGNANCY, URINE: Preg Test, Ur: NEGATIVE

## 2023-04-09 LAB — ABO/RH: ABO/RH(D): O POS

## 2023-04-09 SURGERY — NEPHRECTOMY, RADICAL, ROBOT-ASSISTED, LAPAROSCOPIC, ADULT
Anesthesia: General | Laterality: Right

## 2023-04-09 MED ORDER — ROCURONIUM BROMIDE 10 MG/ML (PF) SYRINGE
PREFILLED_SYRINGE | INTRAVENOUS | Status: AC
  Filled 2023-04-09: qty 10

## 2023-04-09 MED ORDER — SUGAMMADEX SODIUM 200 MG/2ML IV SOLN
INTRAVENOUS | Status: DC | PRN
  Administered 2023-04-09: 240 mg via INTRAVENOUS

## 2023-04-09 MED ORDER — HYDROMORPHONE HCL 1 MG/ML IJ SOLN
INTRAMUSCULAR | Status: AC
  Filled 2023-04-09: qty 1

## 2023-04-09 MED ORDER — DEXMEDETOMIDINE HCL IN NACL 80 MCG/20ML IV SOLN
INTRAVENOUS | Status: DC | PRN
  Administered 2023-04-09 (×2): 8 ug via INTRAVENOUS

## 2023-04-09 MED ORDER — FENTANYL CITRATE (PF) 100 MCG/2ML IJ SOLN
INTRAMUSCULAR | Status: AC
  Filled 2023-04-09: qty 2

## 2023-04-09 MED ORDER — HYDROMORPHONE HCL 1 MG/ML IJ SOLN
0.2500 mg | INTRAMUSCULAR | Status: DC | PRN
  Administered 2023-04-09 (×2): 0.5 mg via INTRAVENOUS
  Administered 2023-04-09: 0.25 mg via INTRAVENOUS
  Administered 2023-04-09: 0.5 mg via INTRAVENOUS
  Administered 2023-04-09: 0.25 mg via INTRAVENOUS

## 2023-04-09 MED ORDER — MIDAZOLAM HCL 2 MG/2ML IJ SOLN
INTRAMUSCULAR | Status: DC | PRN
  Administered 2023-04-09: 2 mg via INTRAVENOUS

## 2023-04-09 MED ORDER — LIDOCAINE HCL (PF) 2 % IJ SOLN
INTRAMUSCULAR | Status: AC
  Filled 2023-04-09: qty 5

## 2023-04-09 MED ORDER — DIPHENHYDRAMINE HCL 50 MG/ML IJ SOLN
12.5000 mg | Freq: Four times a day (QID) | INTRAMUSCULAR | Status: DC | PRN
  Administered 2023-04-09 – 2023-04-10 (×3): 25 mg via INTRAVENOUS
  Filled 2023-04-09 (×3): qty 1

## 2023-04-09 MED ORDER — SODIUM CHLORIDE 0.45 % IV SOLN: INTRAVENOUS | Status: DC

## 2023-04-09 MED ORDER — MEPERIDINE HCL 50 MG/ML IJ SOLN: 6.2500 mg | INTRAMUSCULAR | Status: DC | PRN

## 2023-04-09 MED ORDER — LIDOCAINE 2% (20 MG/ML) 5 ML SYRINGE
INTRAMUSCULAR | Status: DC | PRN
  Administered 2023-04-09: 80 mg via INTRAVENOUS

## 2023-04-09 MED ORDER — PROPOFOL 10 MG/ML IV BOLUS
INTRAVENOUS | Status: DC | PRN
  Administered 2023-04-09: 200 mg via INTRAVENOUS

## 2023-04-09 MED ORDER — KETAMINE HCL 10 MG/ML IJ SOLN
INTRAMUSCULAR | Status: DC | PRN
  Administered 2023-04-09 (×2): 10 mg via INTRAVENOUS

## 2023-04-09 MED ORDER — ACETAMINOPHEN 325 MG PO TABS
650.0000 mg | ORAL_TABLET | ORAL | Status: DC | PRN
  Filled 2023-04-09: qty 2

## 2023-04-09 MED ORDER — ONDANSETRON HCL 4 MG/2ML IJ SOLN
4.0000 mg | INTRAMUSCULAR | Status: DC | PRN
  Administered 2023-04-09 – 2023-04-12 (×6): 4 mg via INTRAVENOUS
  Filled 2023-04-09 (×6): qty 2

## 2023-04-09 MED ORDER — AMISULPRIDE (ANTIEMETIC) 5 MG/2ML IV SOLN
10.0000 mg | Freq: Once | INTRAVENOUS | Status: AC | PRN
  Administered 2023-04-09: 10 mg via INTRAVENOUS

## 2023-04-09 MED ORDER — ALBUTEROL SULFATE (2.5 MG/3ML) 0.083% IN NEBU
2.5000 mg | INHALATION_SOLUTION | Freq: Four times a day (QID) | RESPIRATORY_TRACT | Status: DC | PRN
  Administered 2023-04-09: 2.5 mg via RESPIRATORY_TRACT

## 2023-04-09 MED ORDER — DIPHENHYDRAMINE HCL 12.5 MG/5ML PO ELIX: 12.5000 mg | ORAL_SOLUTION | Freq: Four times a day (QID) | ORAL | Status: DC | PRN

## 2023-04-09 MED ORDER — DEXAMETHASONE SODIUM PHOSPHATE 10 MG/ML IJ SOLN
INTRAMUSCULAR | Status: DC | PRN
  Administered 2023-04-09: 4 mg via INTRAVENOUS

## 2023-04-09 MED ORDER — HYDROMORPHONE HCL 1 MG/ML IJ SOLN
INTRAMUSCULAR | Status: DC | PRN
  Administered 2023-04-09: .4 mg via INTRAVENOUS

## 2023-04-09 MED ORDER — SODIUM CHLORIDE (PF) 0.9 % IJ SOLN
INTRAMUSCULAR | Status: DC | PRN
  Administered 2023-04-09: 20 mL

## 2023-04-09 MED ORDER — DOCUSATE SODIUM 100 MG PO CAPS: 100.0000 mg | ORAL_CAPSULE | Freq: Two times a day (BID) | ORAL | Status: DC

## 2023-04-09 MED ORDER — HYDROMORPHONE HCL 1 MG/ML IJ SOLN
0.5000 mg | INTRAMUSCULAR | Status: DC | PRN
  Administered 2023-04-09 – 2023-04-11 (×11): 1 mg via INTRAVENOUS
  Administered 2023-04-11: 0.5 mg via INTRAVENOUS
  Administered 2023-04-11 – 2023-04-12 (×2): 1 mg via INTRAVENOUS
  Filled 2023-04-09 (×14): qty 1

## 2023-04-09 MED ORDER — FENTANYL CITRATE (PF) 100 MCG/2ML IJ SOLN
INTRAMUSCULAR | Status: DC | PRN
  Administered 2023-04-09: 100 ug via INTRAVENOUS
  Administered 2023-04-09 (×2): 50 ug via INTRAVENOUS

## 2023-04-09 MED ORDER — KETAMINE HCL 50 MG/5ML IJ SOSY
PREFILLED_SYRINGE | INTRAMUSCULAR | Status: AC
  Filled 2023-04-09: qty 5

## 2023-04-09 MED ORDER — HYDROCODONE-ACETAMINOPHEN 7.5-325 MG PO TABS: 1.0000 | ORAL_TABLET | Freq: Once | ORAL | Status: DC | PRN

## 2023-04-09 MED ORDER — MIDAZOLAM HCL 2 MG/2ML IJ SOLN
INTRAMUSCULAR | Status: AC
  Filled 2023-04-09: qty 2

## 2023-04-09 MED ORDER — ROCURONIUM BROMIDE 10 MG/ML (PF) SYRINGE
PREFILLED_SYRINGE | INTRAVENOUS | Status: DC | PRN
  Administered 2023-04-09: 100 mg via INTRAVENOUS
  Administered 2023-04-09: 20 mg via INTRAVENOUS

## 2023-04-09 MED ORDER — PROPOFOL 10 MG/ML IV BOLUS
INTRAVENOUS | Status: AC
  Filled 2023-04-09: qty 20

## 2023-04-09 MED ORDER — LABETALOL HCL 5 MG/ML IV SOLN
INTRAVENOUS | Status: DC | PRN
  Administered 2023-04-09: 10 mg via INTRAVENOUS
  Administered 2023-04-09 (×2): 5 mg via INTRAVENOUS

## 2023-04-09 MED ORDER — ONDANSETRON HCL 4 MG/2ML IJ SOLN
INTRAMUSCULAR | Status: AC
  Filled 2023-04-09: qty 2

## 2023-04-09 MED ORDER — HYOSCYAMINE SULFATE 0.125 MG SL SUBL: 0.1250 mg | SUBLINGUAL_TABLET | SUBLINGUAL | Status: DC | PRN

## 2023-04-09 MED ORDER — ALBUTEROL SULFATE (2.5 MG/3ML) 0.083% IN NEBU
INHALATION_SOLUTION | RESPIRATORY_TRACT | Status: AC
  Filled 2023-04-09: qty 3

## 2023-04-09 MED ORDER — TRAMADOL HCL 50 MG PO TABS: 50.0000 mg | ORAL_TABLET | Freq: Four times a day (QID) | ORAL | 0 refills | Status: DC | PRN

## 2023-04-09 MED ORDER — HYDRALAZINE HCL 20 MG/ML IJ SOLN
INTRAMUSCULAR | Status: AC
  Filled 2023-04-09: qty 1

## 2023-04-09 MED ORDER — HYDRALAZINE HCL 20 MG/ML IJ SOLN
10.0000 mg | Freq: Once | INTRAMUSCULAR | Status: AC
  Administered 2023-04-09: 10 mg via INTRAVENOUS

## 2023-04-09 MED ORDER — LACTATED RINGERS IR SOLN
Status: DC | PRN
  Administered 2023-04-09: 1000 mL

## 2023-04-09 MED ORDER — BUPIVACAINE LIPOSOME 1.3 % IJ SUSP
INTRAMUSCULAR | Status: AC
  Filled 2023-04-09: qty 20

## 2023-04-09 MED ORDER — LABETALOL HCL 5 MG/ML IV SOLN
INTRAVENOUS | Status: AC
  Filled 2023-04-09: qty 4

## 2023-04-09 MED ORDER — METOPROLOL SUCCINATE ER 50 MG PO TB24
50.0000 mg | ORAL_TABLET | Freq: Every day | ORAL | Status: DC
  Administered 2023-04-10 – 2023-04-13 (×4): 50 mg via ORAL
  Filled 2023-04-09 (×4): qty 1

## 2023-04-09 MED ORDER — ONDANSETRON HCL 4 MG/2ML IJ SOLN
INTRAMUSCULAR | Status: DC | PRN
  Administered 2023-04-09: 4 mg via INTRAVENOUS

## 2023-04-09 MED ORDER — STERILE WATER FOR IRRIGATION IR SOLN
Status: DC | PRN
  Administered 2023-04-09: 1000 mL

## 2023-04-09 MED ORDER — SCOPOLAMINE 1 MG/3DAYS TD PT72
1.0000 | MEDICATED_PATCH | TRANSDERMAL | Status: DC
  Administered 2023-04-09: 1.5 mg via TRANSDERMAL
  Filled 2023-04-09: qty 1

## 2023-04-09 MED ORDER — DOCUSATE SODIUM 100 MG PO CAPS
100.0000 mg | ORAL_CAPSULE | Freq: Two times a day (BID) | ORAL | Status: DC
  Administered 2023-04-10 – 2023-04-13 (×7): 100 mg via ORAL
  Filled 2023-04-09 (×7): qty 1

## 2023-04-09 MED ORDER — CEFAZOLIN SODIUM-DEXTROSE 2-4 GM/100ML-% IV SOLN
2.0000 g | INTRAVENOUS | Status: AC
  Administered 2023-04-09: 2 g via INTRAVENOUS
  Filled 2023-04-09: qty 100

## 2023-04-09 MED ORDER — HYDROMORPHONE HCL 2 MG/ML IJ SOLN
INTRAMUSCULAR | Status: AC
  Filled 2023-04-09: qty 1

## 2023-04-09 MED ORDER — LACTATED RINGERS IV SOLN: INTRAVENOUS | Status: DC | PRN

## 2023-04-09 MED ORDER — ALBUTEROL SULFATE HFA 108 (90 BASE) MCG/ACT IN AERS: 2.0000 | INHALATION_SPRAY | Freq: Four times a day (QID) | RESPIRATORY_TRACT | Status: DC | PRN

## 2023-04-09 MED ORDER — HYDROCODONE-ACETAMINOPHEN 5-325 MG PO TABS
1.0000 | ORAL_TABLET | ORAL | Status: DC | PRN
  Administered 2023-04-11: 1 via ORAL
  Administered 2023-04-12: 2 via ORAL
  Administered 2023-04-12 (×2): 1 via ORAL
  Administered 2023-04-13: 2 via ORAL
  Filled 2023-04-09 (×3): qty 2
  Filled 2023-04-09 (×3): qty 1

## 2023-04-09 MED ORDER — AMISULPRIDE (ANTIEMETIC) 5 MG/2ML IV SOLN
INTRAVENOUS | Status: AC
  Filled 2023-04-09: qty 4

## 2023-04-09 MED ORDER — BUPIVACAINE LIPOSOME 1.3 % IJ SUSP
INTRAMUSCULAR | Status: DC | PRN
  Administered 2023-04-09: 20 mL

## 2023-04-09 MED ORDER — ACETAMINOPHEN 500 MG PO TABS
1000.0000 mg | ORAL_TABLET | Freq: Once | ORAL | Status: AC
  Administered 2023-04-09: 1000 mg via ORAL
  Filled 2023-04-09: qty 2

## 2023-04-09 MED ORDER — DEXAMETHASONE SODIUM PHOSPHATE 10 MG/ML IJ SOLN
INTRAMUSCULAR | Status: AC
  Filled 2023-04-09: qty 1

## 2023-04-09 SURGICAL SUPPLY — 64 items
ADH SKN CLS APL DERMABOND .7 (GAUZE/BANDAGES/DRESSINGS) ×1
ADH SKNCLS APL OCTYL .7 VIOL (GAUZE/BANDAGES/DRESSINGS) ×1
APL PRP STRL LF DISP 70% ISPRP (MISCELLANEOUS) ×1
BAG COUNTER SPONGE SURGICOUNT (BAG) IMPLANT
BAG LAPAROSCOPIC 12 15 PORT 16 (BASKET) ×1 IMPLANT
BAG RETRIEVAL 12/15 (BASKET) ×1
BAG SPNG CNTER NS LX DISP (BAG)
CAUTERY HOOK MNPLR 1.6 DVNC XI (INSTRUMENTS) ×1 IMPLANT
CHLORAPREP W/TINT 26 (MISCELLANEOUS) ×1 IMPLANT
CLIP LIGATING HEM O LOK PURPLE (MISCELLANEOUS) ×1 IMPLANT
CLIP LIGATING HEMO LOK XL GOLD (MISCELLANEOUS) ×1 IMPLANT
CLIP LIGATING HEMO O LOK GREEN (MISCELLANEOUS) IMPLANT
COVER SURGICAL LIGHT HANDLE (MISCELLANEOUS) ×1 IMPLANT
COVER TIP SHEARS 8 DVNC (MISCELLANEOUS) ×1 IMPLANT
CUTTER ECHEON FLEX ENDO 45 340 (ENDOMECHANICALS) IMPLANT
DERMABOND ADVANCED .7 DNX12 (GAUZE/BANDAGES/DRESSINGS) ×1 IMPLANT
DRAPE ARM DVNC X/XI (DISPOSABLE) ×4 IMPLANT
DRAPE COLUMN DVNC XI (DISPOSABLE) ×1 IMPLANT
DRAPE INCISE IOBAN 66X45 STRL (DRAPES) ×1 IMPLANT
DRAPE SHEET LG 3/4 BI-LAMINATE (DRAPES) ×1 IMPLANT
DRIVER NDL LRG 8 DVNC XI (INSTRUMENTS) ×2 IMPLANT
DRIVER NDLE LRG 8 DVNC XI (INSTRUMENTS) ×2 IMPLANT
ELECT PENCIL ROCKER SW 15FT (MISCELLANEOUS) ×1 IMPLANT
ELECT REM PT RETURN 15FT ADLT (MISCELLANEOUS) ×1 IMPLANT
FORCEPS PROGRASP DVNC XI (FORCEP) ×2 IMPLANT
GAUZE 4X4 16PLY ~~LOC~~+RFID DBL (SPONGE) IMPLANT
GLOVE BIO SURGEON STRL SZ 6.5 (GLOVE) ×1 IMPLANT
GLOVE BIOGEL PI IND STRL 8 (GLOVE) ×1 IMPLANT
GLOVE SURG LX STRL 7.5 STRW (GLOVE) ×2 IMPLANT
GOWN SRG XL LVL 4 BRTHBL STRL (GOWNS) ×1 IMPLANT
GOWN STRL NON-REIN XL LVL4 (GOWNS) ×1
GOWN STRL REUS W/ TWL XL LVL3 (GOWN DISPOSABLE) ×2 IMPLANT
GOWN STRL REUS W/TWL XL LVL3 (GOWN DISPOSABLE) ×2
HOLDER FOLEY CATH W/STRAP (MISCELLANEOUS) ×1 IMPLANT
IRRIG SUCT STRYKERFLOW 2 WTIP (MISCELLANEOUS) ×1
IRRIGATION SUCT STRKRFLW 2 WTP (MISCELLANEOUS) ×1 IMPLANT
KIT BASIN OR (CUSTOM PROCEDURE TRAY) ×1 IMPLANT
KIT TURNOVER KIT A (KITS) IMPLANT
MARKER SKIN DUAL TIP RULER LAB (MISCELLANEOUS) ×1 IMPLANT
NDL INSUFFLATION 14GA 120MM (NEEDLE) ×1 IMPLANT
NEEDLE INSUFFLATION 14GA 120MM (NEEDLE) ×1 IMPLANT
PROTECTOR NERVE ULNAR (MISCELLANEOUS) ×2 IMPLANT
RELOAD STAPLE 45 2.6 WHT THIN (STAPLE) IMPLANT
SCISSORS LAP 5X45 EPIX DISP (ENDOMECHANICALS) IMPLANT
SCISSORS MNPLR CVD DVNC XI (INSTRUMENTS) ×1 IMPLANT
SEAL UNIV 5-12 XI (MISCELLANEOUS) ×3 IMPLANT
SET TUBE SMOKE EVAC HIGH FLOW (TUBING) ×1 IMPLANT
SOL ELECTROSURG ANTI STICK (MISCELLANEOUS) ×1
SOLUTION ELECTROSURG ANTI STCK (MISCELLANEOUS) ×1 IMPLANT
SPIKE FLUID TRANSFER (MISCELLANEOUS) ×1 IMPLANT
STAPLE RELOAD 45 WHT (STAPLE) ×2 IMPLANT
STAPLE RELOAD 45MM WHITE (STAPLE) ×2
SUT MNCRL AB 4-0 PS2 18 (SUTURE) ×2 IMPLANT
SUT PDS AB 0 CT1 36 (SUTURE) ×2 IMPLANT
SUT VIC AB 0 CT1 27 (SUTURE) ×1
SUT VIC AB 0 CT1 27XBRD ANTBC (SUTURE) ×1 IMPLANT
SUT VIC AB 2-0 SH 27 (SUTURE) ×1
SUT VIC AB 2-0 SH 27X BRD (SUTURE) ×1 IMPLANT
TOWEL OR 17X26 10 PK STRL BLUE (TOWEL DISPOSABLE) ×1 IMPLANT
TRAY FOLEY MTR SLVR 16FR STAT (SET/KITS/TRAYS/PACK) ×1 IMPLANT
TRAY LAPAROSCOPIC (CUSTOM PROCEDURE TRAY) ×1 IMPLANT
TROCAR Z THREAD OPTICAL 12X100 (TROCAR) ×1 IMPLANT
TROCAR Z-THREAD OPTICAL 5X100M (TROCAR) IMPLANT
WATER STERILE IRR 1000ML POUR (IV SOLUTION) ×1 IMPLANT

## 2023-04-09 NOTE — Anesthesia Preprocedure Evaluation (Addendum)
Anesthesia Evaluation  Patient identified by MRN, date of birth, ID band Patient awake    Reviewed: Allergy & Precautions, H&P , NPO status , Patient's Chart, lab work & pertinent test results, reviewed documented beta blocker date and time   Airway Mallampati: II  TM Distance: >3 FB Neck ROM: Full    Dental  (+) Dental Advisory Given, Teeth Intact   Pulmonary asthma , former smoker Quit smoking 02/2022 Rescue inhaler about once a month Snores at night, has never had sleep study    Pulmonary exam normal breath sounds clear to auscultation       Cardiovascular hypertension (155/100 preop, per pt normally 120/80), Pt. on medications and Pt. on home beta blockers Normal cardiovascular exam Rhythm:Regular Rate:Normal     Neuro/Psych negative neurological ROS  negative psych ROS   GI/Hepatic negative GI ROS, Neg liver ROS,,,  Endo/Other    Morbid obesity  Renal/GU Renal disease (L renal atrophy)Cr 1.1  negative genitourinary   Musculoskeletal negative musculoskeletal ROS (+)    Abdominal  (+) + obese  Peds negative pediatric ROS (+)  Hematology  (+) Blood dyscrasia, anemia Hb 11.8   Anesthesia Other Findings   Reproductive/Obstetrics negative OB ROS                             Anesthesia Physical Anesthesia Plan  ASA: 3  Anesthesia Plan: General   Post-op Pain Management: Tylenol PO (pre-op)*, Ketamine IV* and Dilaudid IV   Induction: Intravenous  PONV Risk Score and Plan: 4 or greater and Scopolamine patch - Pre-op, Midazolam, Dexamethasone, Diphenhydramine, Treatment may vary due to age or medical condition and Ondansetron  Airway Management Planned: Oral ETT  Additional Equipment: None  Intra-op Plan:   Post-operative Plan: Extubation in OR  Informed Consent: I have reviewed the patients History and Physical, chart, labs and discussed the procedure including the risks,  benefits and alternatives for the proposed anesthesia with the patient or authorized representative who has indicated his/her understanding and acceptance.     Dental advisory given  Plan Discussed with: CRNA  Anesthesia Plan Comments: (Allergies to toradol, reglan, zofran- per pt takes zofran sublingual without issue, isnt sure that the hives were really d/t zofran )        Anesthesia Quick Evaluation

## 2023-04-09 NOTE — Discharge Instructions (Signed)

## 2023-04-09 NOTE — Transfer of Care (Signed)
Immediate Anesthesia Transfer of Care Note  Patient: [name]  Procedure(s) Performed: XI ROBOTIC ASSISTED RIGHT LAPAROSCOPIC NEPHRECTOMY (Right)  Patient Location: PACU  Anesthesia Type:General  Level of Consciousness: drowsy  Airway & Oxygen Therapy: Patient Spontanous Breathing and Patient connected to face mask oxygen  Post-op Assessment: Report given to RN, Post -op Vital signs reviewed and stable, and Patient moving all extremities X 4  Post vital signs: Reviewed and stable  Last Vitals:  Vitals Value Taken Time  BP 158/94 04/09/23 1615  Temp    Pulse 70 04/09/23 1617  Resp 17 04/09/23 1617  SpO2 92 % 04/09/23 1617  Vitals shown include unvalidated device data.  Last Pain:  Vitals:   04/09/23 1103  TempSrc:   PainSc: 4          Complications: No notable events documented.

## 2023-04-09 NOTE — Op Note (Signed)
Operative Note  Preoperative diagnosis:  1.  Right renal atrophy and recurrent nephrolithiasis  Postoperative diagnosis: 1.  Right renal atrophy and recurrent nephrolithiasis  Procedure(s): 1.  Right send robot-assisted laparoscopic simple nephrectomy (adrenal sparing)  Surgeon: Rhoderick Moody, MD  Assistants: Harrie Foreman, PA-C  An assistant was required for this surgical procedure.  The duties of the assistant included but were not limited to suctioning, passing suture, camera manipulation, retraction.  This procedure would not be able to be performed without an Geophysicist/field seismologist.   Anesthesia:  General  Complications:  None  EBL: 150 mL  Specimens: 1.  Right kidney  Drains/Catheters: 1.  Foley catheter  Intraoperative findings:   Atrophic right kidney Right renal hilum was hemostatic following staple ligation  Indication:  [name] is a 47 y.o. female with recurrent nephrolithiasis and atrophic right kidney.  She has been consented for the above procedures, voices understanding and wishes to proceed.  Description of procedure:  After informed consent was signed, the patient was taken back to the operating room and properly anesthetized.  The patient was then placed in the left lateral decubitus position with all pressure points padded.  The abdomen was then prepped and draped in the usual sterile fashion.  A time-out was then performed.     An 8 mm incision was then made lateral to the right rectus muscle at the level of the right 12th rib.  A Veress needle was then used to access the abdominal cavity.  A saline drop test showed no signs of obstruction and aspiration of the Veress needle revealed no blood or sucus.  The abdominal cavity was then insufflated to 15 mmHg.  An 8 mm robotic trocar was then atraumatically inserted into the abdominal cavity.  The robotic camera was then inserted through the port and inspection of the abdominal cavity revealed no evidence of  adjacent organ or vessel injury. We then placed three additional 8 mm robotic ports, a 12 mm assistant port and a 5 mm subxiphoid port in such as fashion as to triangulate the right renal hilum.  The robot was then docked into postion.   Using a combination of blunt and cold scissors dissection, the hepatic attachments were released from the abdominal sidewall.  A locking grasper was then inserted through the 5 mm sub-xyphoid port and used to retract the posterior surface of the liver more cephalad.  The white line of Toldt along the ascending colon was then incised, allowing [place] to reflect the colon medially and expose the anterior surface of the right kidney.  The duodenum was then Kocherized medially, which abruptly led [place] to the identification of the inferior vena cava.    Once the colon was adequately mobilized, we moved to the lower pole and identified the gonadal vein and ureter.  The gonadal vein was then left running parallel to the vena cava and the right ureter was reflected anteriorly.  Using cautious cautery, the overlying perihilar attachments were then released.  This yielded visualization of the renal hilum, which included a single right renal vein and a single right renal artery.  The perilymphatic tissue surrounding the right renal artery were carefully released so that the right renal artery was fully encircled.    A 45 mm powered endovascular stapler was then used to ligate the right renal artery and vein, en bloc.  The right hilar stump was hemostatic following staple ligation.  Hemoclips were then applied to the proximal aspects of the right ureter, which  was then sharply incised.  The remaining perinephric attachments were then incised using electrocautery. Reinspection of the right retroperitoneal space revealed excellent hemostasis. Once the right kidney was fully mobile, it was placed in an Endo Catch bag and left in the abdominal cavity.   The 12 mm midline assistant port was then  closed using the [employer] technique with a 0 Vicryl suture.  A right lower quadrant Gibson incision was then made in the right kidney was removed within the Endo Catch bag.  The fascia of the external and internal oblique were then closed with a running 0 PDS suture.  The skin incisions were then closed using 4-0 Monocryl.  Dermabond was applied to all skin incisions.  Plan:  Monitor on the floor

## 2023-04-09 NOTE — Anesthesia Procedure Notes (Signed)
Procedure Name: Intubation Date/Time: 04/09/2023 2:02 PM  Performed by: Nelle Don, CRNAPre-anesthesia Checklist: Patient identified, Emergency Drugs available, Suction available and Patient being monitored Patient Re-evaluated:Patient Re-evaluated prior to induction Oxygen Delivery Method: Circle system utilized Preoxygenation: Pre-oxygenation with 100% oxygen Induction Type: IV induction Ventilation: Mask ventilation without difficulty and Oral airway inserted - appropriate to patient size Laryngoscope Size: Mac and 4 Grade View: Grade I Tube type: Oral Tube size: 7.0 mm Number of attempts: 1 Airway Equipment and Method: Stylet Placement Confirmation: ETT inserted through vocal cords under direct vision, positive ETCO2 and breath sounds checked- equal and bilateral Secured at: 22 cm Tube secured with: Tape Dental Injury: Teeth and Oropharynx as per pre-operative assessment

## 2023-04-09 NOTE — Anesthesia Postprocedure Evaluation (Signed)
Anesthesia Post Note  Patient: [name]  Procedure(s) Performed: XI ROBOTIC ASSISTED RIGHT LAPAROSCOPIC NEPHRECTOMY (Right)     Patient location during evaluation: PACU Anesthesia Type: General Level of consciousness: awake and alert Pain management: pain level controlled Vital Signs Assessment: post-procedure vital signs reviewed and stable Respiratory status: spontaneous breathing, nonlabored ventilation, respiratory function stable and patient connected to nasal cannula oxygen Cardiovascular status: blood pressure returned to baseline and stable Postop Assessment: no apparent nausea or vomiting Anesthetic complications: no  No notable events documented.  Last Vitals:  Vitals:   04/09/23 1745 04/09/23 1750  BP: (!) 165/83 (!) 176/80  Pulse: 60 (!) 59  Resp: 15 16  Temp:    SpO2: 97% 98%    Last Pain:  Vitals:   04/09/23 1745  TempSrc:   PainSc: Asleep                 Lalani Winkles S

## 2023-04-10 ENCOUNTER — Encounter (HOSPITAL_COMMUNITY): Payer: Self-pay | Admitting: Urology

## 2023-04-10 DIAGNOSIS — T8131XA Disruption of external operation (surgical) wound, not elsewhere classified, initial encounter: Secondary | ICD-10-CM | POA: Diagnosis not present

## 2023-04-10 DIAGNOSIS — G8929 Other chronic pain: Secondary | ICD-10-CM | POA: Diagnosis present

## 2023-04-10 DIAGNOSIS — Z87442 Personal history of urinary calculi: Secondary | ICD-10-CM | POA: Diagnosis not present

## 2023-04-10 DIAGNOSIS — R110 Nausea: Secondary | ICD-10-CM | POA: Diagnosis present

## 2023-04-10 DIAGNOSIS — Z87440 Personal history of urinary (tract) infections: Secondary | ICD-10-CM | POA: Diagnosis not present

## 2023-04-10 DIAGNOSIS — Z885 Allergy status to narcotic agent status: Secondary | ICD-10-CM | POA: Diagnosis not present

## 2023-04-10 DIAGNOSIS — Z87891 Personal history of nicotine dependence: Secondary | ICD-10-CM | POA: Diagnosis not present

## 2023-04-10 DIAGNOSIS — N261 Atrophy of kidney (terminal): Secondary | ICD-10-CM | POA: Diagnosis present

## 2023-04-10 DIAGNOSIS — R059 Cough, unspecified: Secondary | ICD-10-CM | POA: Diagnosis not present

## 2023-04-10 DIAGNOSIS — Z881 Allergy status to other antibiotic agents status: Secondary | ICD-10-CM | POA: Diagnosis not present

## 2023-04-10 DIAGNOSIS — X509XXA Other and unspecified overexertion or strenuous movements or postures, initial encounter: Secondary | ICD-10-CM | POA: Diagnosis not present

## 2023-04-10 DIAGNOSIS — I10 Essential (primary) hypertension: Secondary | ICD-10-CM | POA: Diagnosis present

## 2023-04-10 LAB — BASIC METABOLIC PANEL
Anion gap: 7 (ref 5–15)
BUN: 11 mg/dL (ref 6–20)
CO2: 20 mmol/L — ABNORMAL LOW (ref 22–32)
Calcium: 8 mg/dL — ABNORMAL LOW (ref 8.9–10.3)
Chloride: 104 mmol/L (ref 98–111)
Creatinine, Ser: 1.11 mg/dL — ABNORMAL HIGH (ref 0.44–1.00)
GFR, Estimated: >60 mL/min (ref >60)
Glucose, Bld: 123 mg/dL — ABNORMAL HIGH (ref 70–99)
Potassium: 4 mmol/L (ref 3.5–5.1)
Sodium: 131 mmol/L — ABNORMAL LOW (ref 135–145)

## 2023-04-10 LAB — HEMOGLOBIN AND HEMATOCRIT, BLOOD
HCT: 30.9 % — ABNORMAL LOW (ref 36.0–46.0)
Hemoglobin: 10.1 g/dL — ABNORMAL LOW (ref 12.0–15.0)

## 2023-04-10 MED ORDER — HYDROXYZINE HCL 25 MG PO TABS
25.0000 mg | ORAL_TABLET | Freq: Four times a day (QID) | ORAL | Status: DC | PRN
  Administered 2023-04-10 – 2023-04-12 (×5): 25 mg via ORAL
  Filled 2023-04-10 (×5): qty 1

## 2023-04-10 MED ORDER — SODIUM CHLORIDE 0.9 % IV SOLN
25.0000 mg | Freq: Four times a day (QID) | INTRAVENOUS | Status: DC | PRN
  Administered 2023-04-10 (×2): 25 mg via INTRAVENOUS
  Filled 2023-04-10: qty 1
  Filled 2023-04-10 (×2): qty 25

## 2023-04-10 NOTE — Progress Notes (Signed)
1 Day Post-Op Subjective: Patient had a rough night, significant itching and nausea with several bouts of emesis.  Seems to be doing somewhat better this morning.  Limiting intake to some ice chips and sips of clear liquids.  Objective: Vital signs in last 24 hours: Temp:  [97.4 F (36.3 C)-98.6 F (37 C)] 98.6 F (37 C) (06/22 0548) Pulse Rate:  [58-94] 66 (06/22 0548) Resp:  [9-24] 18 (06/22 0548) BP: (143-198)/(79-111) 143/79 (06/22 0548) SpO2:  [87 %-100 %] 99 % (06/22 0548) Weight:  [118.8 kg] 118.8 kg (06/21 1103)  Intake/Output from previous day: 06/21 0701 - 06/22 0700 In: 2522.7 [P.O.:240; I.V.:2182.7; IV Piggyback:100] Out: 1400 [Urine:1250; Blood:150] Intake/Output this shift: No intake/output data recorded.  Physical Exam:  General: Alert and oriented CV: RRR Lungs: Clear Abdomen: Soft, ND, hypoactive bowel sounds Incisions: Look clear no drainage Ext: NT, No erythema  Lab Results: Recent Labs    04/09/23 1723 04/10/23 0454  HGB 11.3* 10.1*  HCT 36.9 30.9*   BMET Recent Labs    04/10/23 0454  NA 131*  K 4.0  CL 104  CO2 20*  GLUCOSE 123*  BUN 11  CREATININE 1.11*  CALCIUM 8.0*     Studies/Results: No results found.  Assessment/Plan: 1.  Status post robot-assisted laparoscopic simple nephrectomy, postoperative nausea /itching Plan/recommendation.  Ambulation today.  Added Phenergan for nausea.  Itching may be related to narcotic use however no evidence of rash.  Will follow treat with Benadryl as needed.  Will advance to clear liquids as tolerated today.     LOS: 0 days   Belva Agee 04/10/2023, 7:51 AM

## 2023-04-11 LAB — CBC WITH DIFFERENTIAL/PLATELET
Abs Immature Granulocytes: 0.04 10*3/uL (ref 0.00–0.07)
Basophils Absolute: 0 10*3/uL (ref 0.0–0.1)
Basophils Relative: 0 %
Eosinophils Absolute: 0.1 10*3/uL (ref 0.0–0.5)
Eosinophils Relative: 1 %
HCT: 30.9 % — ABNORMAL LOW (ref 36.0–46.0)
Hemoglobin: 10 g/dL — ABNORMAL LOW (ref 12.0–15.0)
Immature Granulocytes: 1 %
Lymphocytes Relative: 25 %
Lymphs Abs: 2.2 10*3/uL (ref 0.7–4.0)
MCH: 31.2 pg (ref 26.0–34.0)
MCHC: 32.4 g/dL (ref 30.0–36.0)
MCV: 96.3 fL (ref 80.0–100.0)
Monocytes Absolute: 0.6 10*3/uL (ref 0.1–1.0)
Monocytes Relative: 7 %
Neutro Abs: 5.7 10*3/uL (ref 1.7–7.7)
Neutrophils Relative %: 66 %
Platelets: 265 10*3/uL (ref 150–400)
RBC: 3.21 MIL/uL — ABNORMAL LOW (ref 3.87–5.11)
RDW: 15.1 % (ref 11.5–15.5)
WBC: 8.7 10*3/uL (ref 4.0–10.5)
nRBC: 0 % (ref 0.0–0.2)

## 2023-04-11 LAB — BASIC METABOLIC PANEL
Anion gap: 9 (ref 5–15)
BUN: 10 mg/dL (ref 6–20)
CO2: 21 mmol/L — ABNORMAL LOW (ref 22–32)
Calcium: 7.7 mg/dL — ABNORMAL LOW (ref 8.9–10.3)
Chloride: 106 mmol/L (ref 98–111)
Creatinine, Ser: 1.29 mg/dL — ABNORMAL HIGH (ref 0.44–1.00)
GFR, Estimated: 52 mL/min — ABNORMAL LOW (ref >60)
Glucose, Bld: 112 mg/dL — ABNORMAL HIGH (ref 70–99)
Potassium: 3.5 mmol/L (ref 3.5–5.1)
Sodium: 136 mmol/L (ref 135–145)

## 2023-04-11 MED ORDER — ACETAMINOPHEN 10 MG/ML IV SOLN
1000.0000 mg | Freq: Four times a day (QID) | INTRAVENOUS | Status: AC
  Administered 2023-04-11 – 2023-04-12 (×3): 1000 mg via INTRAVENOUS
  Filled 2023-04-11 (×4): qty 100

## 2023-04-11 NOTE — Progress Notes (Signed)
Pt ambulated on hallway, tolerated well.  

## 2023-04-11 NOTE — Progress Notes (Addendum)
2 Days Post-Op Subjective: Patient continues to have abdominal discomfort and nausea and vomiting.  Unable to keep down much of anything yesterday.  Nausea meds send seems to help.  Itching seems to be related to use of narcotics.  Patient starting to have flatus today.  Objective: Vital signs in last 24 hours: Temp:  [97.5 F (36.4 C)-99.1 F (37.3 C)] 99.1 F (37.3 C) (06/23 0425) Pulse Rate:  [76-90] 76 (06/23 0900) Resp:  [18-20] 20 (06/23 0425) BP: (134-137)/(78-87) 137/78 (06/23 0900) SpO2:  [96 %-97 %] 96 % (06/23 0425) Weight:  [308 kg] 123 kg (06/22 1107)  Intake/Output from previous day: 06/22 0701 - 06/23 0700 In: 1937.7 [P.O.:240; I.V.:1597.7; IV Piggyback:100] Out: 1150 [Urine:1150] Intake/Output this shift: Total I/O In: 273.7 [I.V.:273.7] Out: -   Physical Exam:  General: Alert and oriented CV: RRR Lungs: Clear Abdomen: Soft, ND, positive bowel sounds Incisions: Look clear.  She has superficial separation of a couple of the trocar sites which are now covered with gauze when she coughed. Ext: NT, No erythema  Lab Results: Recent Labs    04/09/23 1723 04/10/23 0454  HGB 11.3* 10.1*  HCT 36.9 30.9*   BMET Recent Labs    04/10/23 0454  NA 131*  K 4.0  CL 104  CO2 20*  GLUCOSE 123*  BUN 11  CREATININE 1.11*  CALCIUM 8.0*     Studies/Results: No results found.  Assessment/Plan: Status post nephrectomy with likely postoperative ileus Plan/recommendation will check labs again today, IV Tylenol for pain relief as it seems to cause itching with the current narcotic.  If continues to have significant nausea vomiting will get KUB.    LOS: 1 day   Belva Agee 04/11/2023, 10:01 AM

## 2023-04-12 LAB — BASIC METABOLIC PANEL
Anion gap: 13 (ref 5–15)
BUN: 7 mg/dL (ref 6–20)
CO2: 21 mmol/L — ABNORMAL LOW (ref 22–32)
Calcium: 7.9 mg/dL — ABNORMAL LOW (ref 8.9–10.3)
Chloride: 106 mmol/L (ref 98–111)
Creatinine, Ser: 1.09 mg/dL — ABNORMAL HIGH (ref 0.44–1.00)
GFR, Estimated: >60 mL/min (ref >60)
Glucose, Bld: 114 mg/dL — ABNORMAL HIGH (ref 70–99)
Potassium: 3.3 mmol/L — ABNORMAL LOW (ref 3.5–5.1)
Sodium: 140 mmol/L (ref 135–145)

## 2023-04-12 LAB — CBC WITH DIFFERENTIAL/PLATELET
Abs Immature Granulocytes: 0.02 10*3/uL (ref 0.00–0.07)
Basophils Absolute: 0 10*3/uL (ref 0.0–0.1)
Basophils Relative: 1 %
Eosinophils Absolute: 0.1 10*3/uL (ref 0.0–0.5)
Eosinophils Relative: 1 %
HCT: 31.5 % — ABNORMAL LOW (ref 36.0–46.0)
Hemoglobin: 9.8 g/dL — ABNORMAL LOW (ref 12.0–15.0)
Immature Granulocytes: 0 %
Lymphocytes Relative: 31 %
Lymphs Abs: 1.8 10*3/uL (ref 0.7–4.0)
MCH: 31.3 pg (ref 26.0–34.0)
MCHC: 31.1 g/dL (ref 30.0–36.0)
MCV: 100.6 fL — ABNORMAL HIGH (ref 80.0–100.0)
Monocytes Absolute: 0.5 10*3/uL (ref 0.1–1.0)
Monocytes Relative: 9 %
Neutro Abs: 3.3 10*3/uL (ref 1.7–7.7)
Neutrophils Relative %: 58 %
Platelets: 253 10*3/uL (ref 150–400)
RBC: 3.13 MIL/uL — ABNORMAL LOW (ref 3.87–5.11)
RDW: 14.8 % (ref 11.5–15.5)
WBC: 5.8 10*3/uL (ref 4.0–10.5)
nRBC: 0 % (ref 0.0–0.2)

## 2023-04-12 MED ORDER — PROMETHAZINE HCL 12.5 MG PO TABS: 12.5000 mg | ORAL_TABLET | ORAL | 0 refills | Status: DC | PRN

## 2023-04-12 MED ORDER — BISACODYL 10 MG RE SUPP
10.0000 mg | Freq: Once | RECTAL | Status: AC
  Administered 2023-04-12: 10 mg via RECTAL
  Filled 2023-04-12: qty 1

## 2023-04-12 MED ORDER — ALUM & MAG HYDROXIDE-SIMETH 200-200-20 MG/5ML PO SUSP
30.0000 mL | ORAL | Status: DC | PRN
  Administered 2023-04-12: 30 mL via ORAL
  Filled 2023-04-12: qty 30

## 2023-04-12 NOTE — Progress Notes (Signed)
3 Days Post-Op Subjective: 6/24: No acute events overnight.  Patient reports N/V resolving yesterday evening.  She has been keeping down water since then.  Continues to have passage of flatus and a small bowel movement.  Pain is well-controlled and patient has been ambulating.   Objective: Vital signs in last 24 hours: Temp:  [97.5 F (36.4 C)-98.7 F (37.1 C)] 98.5 F (36.9 C) (06/24 0441) Pulse Rate:  [70-80] 73 (06/24 0441) Resp:  [17-20] 17 (06/24 0441) BP: (115-142)/(54-96) 125/77 (06/24 0441) SpO2:  [96 %-98 %] 96 % (06/24 0441)  Intake/Output from previous day: 06/23 0701 - 06/24 0700 In: 2540.6 [P.O.:660; I.V.:1580.6; IV Piggyback:300] Out: -  Intake/Output this shift: Total I/O In: 342.5 [I.V.:342.5] Out: -   Physical Exam:  General: Alert and oriented CV: RRR Lungs: Clear Abdomen: Soft, ND, positive bowel sounds Incisions: Look clear.  She has superficial separation of a couple of the trocar sites which are now covered with gauze when she coughed. Ext: NT, No erythema  Lab Results: Recent Labs    04/10/23 0454 04/11/23 1117 04/12/23 0514  HGB 10.1* 10.0* 9.8*  HCT 30.9* 30.9* 31.5*   BMET Recent Labs    04/11/23 1117 04/12/23 0514  NA 136 140  K 3.5 3.3*  CL 106 106  CO2 21* 21*  GLUCOSE 112* 114*  BUN 10 7  CREATININE 1.29* 1.09*  CALCIUM 7.7* 7.9*     Studies/Results: No results found.  Assessment/Plan: # S/p nephrectomy Excellent UOP with serum creatinine of 1.09.  Will stop IV fluids today and encourage oral hydration. Hgb stable Dressings taken down.  Very superficial wound dehiscence of 2 port sites d/t coughing.  Will request nursing to place Steri-Strips over these Hopeful discharge later today or tomorrow morning  # Postoperative ileus Do not believe patient has an ileus as of this morning.  She is not overly distended and abdomen is soft to palpation.  She is passing flatus and has had a small bowel movement. Clear liquid diet  this morning, advance as tolerated. Bisacodyl suppository.    LOS: 2 days   Scherrie Bateman Meliana Canner 04/12/2023, 8:52 AM

## 2023-04-12 NOTE — Plan of Care (Signed)
  Problem: Education: Goal: Knowledge of General Education information will improve Description: Including pain rating scale, medication(s)/side effects and non-pharmacologic comfort measures Outcome: Not Progressing   Problem: Health Behavior/Discharge Planning: Goal: Ability to manage health-related needs will improve Outcome: Not Progressing   

## 2023-04-12 NOTE — Progress Notes (Signed)
Transition of Care [facility]) - Inpatient Brief Assessment   Patient Details  Name: [name] MRN: [id number] Date of Birth: 28-Feb-1976  Transition of Care [facility]) CM/SW Contact:    Larrie Kass, LCSW Phone Number: 04/12/2023, 2:51 PM   Clinical Narrative:  Transition of Care Department [facility]) has reviewed patient and no TOC needs have been identified at this time. We will continue to monitor patient advancement through interdisciplinary progression rounds. If new patient transition needs arise, please place a TOC consult.  Transition of Care Asessment: Insurance and Status: Insurance coverage has been reviewed Patient has primary care physician: Yes Home environment has been reviewed: yes Prior level of function:: independent Prior/Current Home Services: No current home services Social Determinants of Health Reivew: SDOH reviewed no interventions necessary Readmission risk has been reviewed: Yes Transition of care needs: no transition of care needs at this time

## 2023-04-13 LAB — SURGICAL PATHOLOGY

## 2023-04-13 NOTE — Plan of Care (Signed)
  Problem: Education: Goal: Knowledge of the prescribed therapeutic regimen will improve Outcome: Adequate for Discharge   Problem: Bowel/Gastric: Goal: Gastrointestinal status for postoperative course will improve Outcome: Adequate for Discharge   Problem: Clinical Measurements: Goal: Postoperative complications will be avoided or minimized Outcome: Adequate for Discharge   Problem: Respiratory: Goal: Ability to achieve and maintain a regular respiratory rate will improve Outcome: Adequate for Discharge   Problem: Skin Integrity: Goal: Demonstration of wound healing without infection will improve Outcome: Adequate for Discharge   Problem: Urinary Elimination: Goal: Ability to avoid or minimize complications of infection will improve Outcome: Adequate for Discharge Goal: Ability to achieve and maintain urine output will improve Outcome: Adequate for Discharge   Problem: Education: Goal: Knowledge of General Education information will improve Description: Including pain rating scale, medication(s)/side effects and non-pharmacologic comfort measures 04/13/2023 1134 by Val Eagle, RN Outcome: Adequate for Discharge 04/13/2023 1132 by Val Eagle, RN Outcome: Progressing   Problem: Health Behavior/Discharge Planning: Goal: Ability to manage health-related needs will improve Outcome: Adequate for Discharge   Problem: Clinical Measurements: Goal: Ability to maintain clinical measurements within normal limits will improve Outcome: Adequate for Discharge Goal: Will remain free from infection Outcome: Adequate for Discharge Goal: Diagnostic test results will improve Outcome: Adequate for Discharge Goal: Respiratory complications will improve Outcome: Adequate for Discharge Goal: Cardiovascular complication will be avoided Outcome: Adequate for Discharge   Problem: Activity: Goal: Risk for activity intolerance will decrease Outcome: Adequate for Discharge    Problem: Nutrition: Goal: Adequate nutrition will be maintained Outcome: Adequate for Discharge   Problem: Coping: Goal: Level of anxiety will decrease Outcome: Adequate for Discharge   Problem: Elimination: Goal: Will not experience complications related to bowel motility Outcome: Adequate for Discharge Goal: Will not experience complications related to urinary retention Outcome: Adequate for Discharge   Problem: Pain Managment: Goal: General experience of comfort will improve Outcome: Adequate for Discharge   Problem: Safety: Goal: Ability to remain free from injury will improve Outcome: Adequate for Discharge   Problem: Skin Integrity: Goal: Risk for impaired skin integrity will decrease Outcome: Adequate for Discharge

## 2023-04-13 NOTE — Discharge Summary (Signed)
Date of admission: 04/09/2023  Date of discharge: 04/13/2023  Admission diagnosis: Right renal atrophy and recurrent nephrolithiasis  Discharge diagnosis: Same  Secondary diagnoses: HTN  History and Physical: For full details, please see admission history and physical. Briefly, [name] is a 47 y.o. year old patient with a history of recurrent UTIs, kidney stones, s/p right PCNL and multiple right ureteroscopies and right renal atrophy (previously followed by Dr. Pete Glatter).   02/08/23: The patient presents today to establish care. She reports multiple UTIs per year, recurrent stones since age 35. She currently denies any flank pain, dysuria or hematuria. CT from March revealed right renal atrophy with multiple large renal stones along with hydronephrosis w/o evidence of an obstructing stone. Hx of ruptured appendicitis in 2019 and tubal ligation.   Hospital Course: Pt was admitted and taken to the OR on 04/09/23 for a right robotic assisted lap radical nephrectomy.  Pt tolerated the procedure well and was hemodynamically stable throughout.  She was extubated without complication and woke up from anesthesia neurologically intact. Pt was transferred from the OR to PACU and then to the floor without difficulty. Post op course was complicated by nausea that persisted through POD 3.  Anti-emetics did help.  She also had some itching with narcotics but no rash or SOB.  She was able to ambulate starting POD 1.  Foley was removed and she voided without difficulty.  Bowel function returned with passing of flatus and a small BM on POD 3.  By POD 4 she was ambulating in the halls and tolerating a regular diet with good pain control.  She was felt stable for d/c home.   Laboratory values:  Recent Labs    04/11/23 1117 04/12/23 0514  HGB 10.0* 9.8*  HCT 30.9* 31.5*   Recent Labs    04/11/23 1117 04/12/23 0514  CREATININE 1.29* 1.09*    Disposition: Home  Discharge instruction: The patient  was instructed to be ambulatory but told to refrain from heavy lifting, strenuous activity, or driving.   Discharge medications:  Allergies as of 04/13/2023       Reactions   Flagyl [metronidazole Hcl] Hives   Ketorolac Tromethamine Hives   Metoclopramide Hcl Other (See Comments)   Facial spasm; "they thought I was having a stroke"   Percocet [oxycodone-acetaminophen] Hives, Itching   Barium Sulfate Itching        Medication List     TAKE these medications    albuterol 108 (90 Base) MCG/ACT inhaler Commonly known as: VENTOLIN HFA Inhale 2 puffs into the lungs every 6 (six) hours as needed for wheezing. For wheezing   docusate sodium 100 MG capsule Commonly known as: COLACE Take 1 capsule (100 mg total) by mouth 2 (two) times daily.   hydrochlorothiazide 25 MG tablet Commonly known as: HYDRODIURIL Take 25 mg by mouth daily.   metoprolol succinate 50 MG 24 hr tablet Commonly known as: TOPROL-XL Take 50 mg by mouth daily.   promethazine 12.5 MG tablet Commonly known as: PHENERGAN Take 1 tablet (12.5 mg total) by mouth every 4 (four) hours as needed for nausea or vomiting.   traMADol 50 MG tablet Commonly known as: Ultram Take 1-2 tablets (50-100 mg total) by mouth every 6 (six) hours as needed for moderate pain or severe pain.        Followup:   Follow-up Information     Hillery Aldo, NP Follow up on 04/23/2023.   Specialty: Nurse Practitioner Why: at 10:30 Contact information:  [address] [place] [address] [phone]

## 2023-04-13 NOTE — Plan of Care (Signed)
  Problem: Education: Goal: Knowledge of General Education information will improve Description Including pain rating scale, medication(s)/side effects and non-pharmacologic comfort measures Outcome: Progressing   

## 2024-05-18 ENCOUNTER — Emergency Department (HOSPITAL_BASED_OUTPATIENT_CLINIC_OR_DEPARTMENT_OTHER)
Admission: EM | Admit: 2024-05-18 | Discharge: 2024-05-18 | Disposition: A | Discharge status: Home / Self Care | Attending: Emergency Medicine | Admitting: Emergency Medicine

## 2024-05-18 ENCOUNTER — Emergency Department (HOSPITAL_BASED_OUTPATIENT_CLINIC_OR_DEPARTMENT_OTHER)

## 2024-05-18 ENCOUNTER — Encounter (HOSPITAL_BASED_OUTPATIENT_CLINIC_OR_DEPARTMENT_OTHER): Payer: Self-pay | Admitting: Emergency Medicine

## 2024-05-18 ENCOUNTER — Other Ambulatory Visit: Payer: Self-pay

## 2024-05-18 DIAGNOSIS — I10 Essential (primary) hypertension: Secondary | ICD-10-CM | POA: Diagnosis not present

## 2024-05-18 DIAGNOSIS — S92425A Nondisplaced fracture of distal phalanx of left great toe, initial encounter for closed fracture: Secondary | ICD-10-CM | POA: Insufficient documentation

## 2024-05-18 DIAGNOSIS — J45909 Unspecified asthma, uncomplicated: Secondary | ICD-10-CM | POA: Diagnosis not present

## 2024-05-18 DIAGNOSIS — Z79899 Other long term (current) drug therapy: Secondary | ICD-10-CM | POA: Insufficient documentation

## 2024-05-18 DIAGNOSIS — M79674 Pain in right toe(s): Secondary | ICD-10-CM | POA: Diagnosis present

## 2024-05-18 DIAGNOSIS — Z7951 Long term (current) use of inhaled steroids: Secondary | ICD-10-CM | POA: Insufficient documentation

## 2024-05-18 DIAGNOSIS — W208XXA Other cause of strike by thrown, projected or falling object, initial encounter: Secondary | ICD-10-CM | POA: Diagnosis not present

## 2024-05-18 NOTE — Progress Notes (Signed)
 Surgical Instructions   Your procedure is scheduled on Friday, August 8th, 2025. Report to [facility] Main Entrance A at 5:30 A.M., then check in with the Admitting office. Any questions or running late day of surgery: call [phone]  Questions prior to your surgery date: call [phone], Monday-Friday, 8am-4pm. If you experience any cold or flu symptoms such as cough, fever, chills, shortness of breath, etc. between now and your scheduled surgery, please notify us  at the above number.     Remember:  Do not eat after midnight the night before your surgery   You may drink clear liquids until 4:30 the morning of your surgery.   Clear liquids allowed are: Water , Non-Citrus Juices (without pulp), Carbonated Beverages, Clear Tea (no milk, honey, etc.), Black Coffee Only (NO MILK, CREAM OR POWDERED CREAMER of any kind), and Gatorade.    Take these medicines the morning of surgery with A SIP OF WATER : Metoprolol  Succinate (Toprol -XL)    May take these medicines IF NEEDED: Acetaminophen  (Tylenol ) Albuterol  (Proventil  HFA, Ventolin  HFA)    One week prior to surgery, STOP taking any Aspirin (unless otherwise instructed by your surgeon) Aleve, Naproxen, Ibuprofen , Motrin , Advil , Goody's, BC's, all herbal medications, fish oil, and non-prescription vitamins.                     Do NOT Smoke (Tobacco/Vaping) for 24 hours prior to your procedure.  If you use a CPAP at night, you may bring your mask/headgear for your overnight stay.   You will be asked to remove any contacts, glasses, piercing's, hearing aid's, dentures/partials prior to surgery. Please bring cases for these items if needed.    Patients discharged the day of surgery will not be allowed to drive home, and someone needs to stay with them for 24 hours.  SURGICAL WAITING ROOM VISITATION Patients may have no more than 2 support people in the waiting area - these visitors may rotate.   Pre-op nurse will coordinate an  appropriate time for 1 ADULT support person, who may not rotate, to accompany patient in pre-op.  Children under the age of 23 must have an adult with them who is not the patient and must remain in the main waiting area with an adult.  If the patient needs to stay at the hospital during part of their recovery, the visitor guidelines for inpatient rooms apply.  Please refer to the [facility] website for the visitor guidelines for any additional information.   If you received a COVID test during your pre-op visit  it is requested that you wear a mask when out in public, stay away from anyone that may not be feeling well and notify your surgeon if you develop symptoms. If you have been in contact with anyone that has tested positive in the last 10 days please notify you surgeon.      Pre-operative CHG Bathing Instructions   You can play a key role in reducing the risk of infection after surgery. Your skin needs to be as free of germs as possible. You can reduce the number of germs on your skin by washing with CHG (chlorhexidine  gluconate) soap before surgery. CHG is an antiseptic soap that kills germs and continues to kill germs even after washing.   DO NOT use if you have an allergy to chlorhexidine /CHG or antibacterial soaps. If your skin becomes reddened or irritated, stop using the CHG and notify one of our RNs at [phone].  TAKE A SHOWER THE NIGHT BEFORE SURGERY AND THE DAY OF SURGERY    Please keep in mind the following:  DO NOT shave, including legs and underarms, 48 hours prior to surgery.   You may shave your face before/day of surgery.  Place clean sheets on your bed the night before surgery Use a clean washcloth (not used since being washed) for each shower. DO NOT sleep with pet's night before surgery.  CHG Shower Instructions:  Wash your face and private area with normal soap. If you choose to wash your hair, wash first with your normal shampoo.  After you use  shampoo/soap, rinse your hair and body thoroughly to remove shampoo/soap residue.  Turn the water  OFF and apply half the bottle of CHG soap to a CLEAN washcloth.  Apply CHG soap ONLY FROM YOUR NECK DOWN TO YOUR TOES (washing for 3-5 minutes)  DO NOT use CHG soap on face, private areas, open wounds, or sores.  Pay special attention to the area where your surgery is being performed.  If you are having back surgery, having someone wash your back for you may be helpful. Wait 2 minutes after CHG soap is applied, then you may rinse off the CHG soap.  Pat dry with a clean towel  Put on clean pajamas    Additional instructions for the day of surgery: DO NOT APPLY any lotions, deodorants, cologne, or perfumes.   Do not wear jewelry or makeup Do not wear nail polish, gel polish, artificial nails, or any other type of covering on natural nails (fingers and toes) Do not bring valuables to the hospital. [facility] is not responsible for valuables/personal belongings. Put on clean/comfortable clothes.  Please brush your teeth.  Ask your nurse before applying any prescription medications to the skin.

## 2024-05-18 NOTE — Discharge Instructions (Addendum)
 Thank you for letting us  evaluate you today.  You have sustained a fracture to your great toe.  I provided you a cam boot, crutches, pain medicine to use as needed.  Otherwise you may use Tylenol , ibuprofen .  Make sure to not walk on it, elevate, ice area to reduce swelling, further injury.  Please follow-up with podiatry for further management

## 2024-05-18 NOTE — ED Notes (Signed)
 PA applied CAM boot & splinted left great toe.

## 2024-05-18 NOTE — ED Triage Notes (Signed)
 Pt reports having ipad fall on top of L foot tonight.  C/o L great toe pain.

## 2024-05-18 NOTE — ED Provider Notes (Signed)
 [place] [facility] Provider Note   CSN: [id number] Arrival date & time: 05/18/24  2148     Patient presents with: Toe Pain   [name] is a 48 y.o. female with past medical history of HTN, asthma, kidney stones presents to emergency department following having her iPad fall on top of her left great toe tonight.  Subsequently, has left great toe pain with palpation and with bearing weight.  Denies any additional injuries, head injury, LOC, complaints prior to injury    Toe Pain       Prior to Admission medications   Medication Sig Start Date End Date Taking? Authorizing Provider  acetaminophen  (TYLENOL ) 500 MG tablet Take 500-1,000 mg by mouth every 6 (six) hours as needed (pain.).    [provider]  albuterol  (PROVENTIL  HFA;VENTOLIN  HFA) 108 (90 BASE) MCG/ACT inhaler Inhale 1-2 puffs into the lungs every 6 (six) hours as needed for wheezing or shortness of breath. For wheezing    [provider]  hydrochlorothiazide  (HYDRODIURIL ) 25 MG tablet Take 25 mg by mouth in the morning. 09/27/20   [provider]  metoprolol  succinate (TOPROL -XL) 50 MG 24 hr tablet Take 50 mg by mouth in the morning. 11/21/16   [provider]  nystatin (MYCOSTATIN/NYSTOP) powder Apply 1 Application topically 2 (two) times daily as needed (skin irritation.). 03/07/24   [provider]    Allergies: Flagyl [metronidazole hcl], Ketorolac  tromethamine , Metoclopramide hcl, Percocet [oxycodone -acetaminophen ], and Barium sulfate    Review of Systems  Musculoskeletal:  Positive for arthralgias.    Updated Vital Signs BP (!) 144/97   Pulse 74   Temp 97.8 F (36.6 C) (Oral)   Resp 18   Ht 5' 5 (1.651 m)   Wt 118.8 kg   LMP 05/10/2024 (Exact Date)   SpO2 98%   BMI 43.60 kg/m   Physical Exam Vitals and nursing note reviewed.  Constitutional:      General: She is not in acute distress.    Appearance: Normal  appearance.  HENT:     Head: Normocephalic and atraumatic.  Eyes:     Conjunctiva/sclera: Conjunctivae normal.  Cardiovascular:     Rate and Rhythm: Normal rate.  Pulmonary:     Effort: Pulmonary effort is normal. No respiratory distress.     Breath sounds: Normal breath sounds.  Musculoskeletal:     Comments: TTP of right great toe at proximal pharynx. Refuses to move great toe 2/2 pain. No gross swelling, warmth, erythema, eccyhmosis to toe. Sensation 2/2 of digits 1-5 of left foot. No ttp of left foot and digits otherwise. ROM WNL of left ankle, digits 2-5  Skin:    General: Skin is warm.     Capillary Refill: Capillary refill takes less than 2 seconds.     Coloration: Skin is not jaundiced or pale.  Neurological:     Mental Status: She is alert and oriented to person, place, and time. Mental status is at baseline.     (all labs ordered are listed, but only abnormal results are displayed) Labs Reviewed - No data to display  EKG: None  Radiology: No results found.  Medications Ordered in the ED - No data to display                                  Medical Decision Making Amount and/or Complexity of Data Reviewed Radiology: ordered.  Patient presents to the ED for concern of toe injury, this involves an extensive number of treatment options, and is a complaint that carries with it a high risk of complications and morbidity.  The differential diagnosis includes fracture, contusion, dislocation, abrasion, laceration   Co morbidities that complicate the patient evaluation  None   Additional history obtained:  Additional history obtained from Nursing   External records from outside source obtained and reviewed including triage RN note    Imaging Studies ordered:  I ordered imaging studies including great toe XR I independently visualized and interpreted imaging which showed  Nondisplaced first distal phalangeal fracture  I agree with the radiologist  interpretation     Problem List / ED Course:  Closed nondisplaced fracture of distal phalanx of left great toe Sensation intact from distal to proximal phalanx.  No gross swelling nor ecchymosis at this time.  ROM limited secondary to pain.  No open skin nor open fracture X-ray notable for nondisplaced first distal phalangeal fracture Although analgesia while patient was in ED however she refused as she has Tylenol , ibuprofen  at home.  Did provide patient with cam boot, crutches.  Splinted great toe with splints to avoid movement of toe, displacement of fracture.  Discussed that patient should limit weightbearing on that toe and symptomatic care to include RICE Provided podiatry follow-up   Reevaluation:  After the interventions noted above, I reevaluated the patient and found that they have :improved   Social Determinants of Health:  Has PCP   Dispostion:  After consideration of the diagnostic results and the patients response to treatment, I feel that the patent would benefit from outpatient management with podiatry follow-up.   Discussed ED workup, disposition, return to ED precautions with patient who expresses understanding agrees with plan.  All questions answered to their satisfaction.  They are agreeable to plan.  Discharge instructions provided on paperwork  Final diagnoses:  Closed nondisplaced fracture of distal phalanx of left great toe, initial encounter    ED Discharge Orders     None        Minnie Tinnie BRAVO, PA 05/22/24 1359    Towana Ozell BROCKS, MD 05/23/24 [phone]

## 2024-05-19 ENCOUNTER — Encounter (HOSPITAL_COMMUNITY): Payer: Self-pay

## 2024-05-19 ENCOUNTER — Encounter (HOSPITAL_COMMUNITY)
Admission: RE | Admit: 2024-05-19 | Discharge: 2024-05-19 | Disposition: A | Discharge status: Home / Self Care | Source: Ambulatory Visit | Attending: General Surgery | Admitting: General Surgery

## 2024-05-19 ENCOUNTER — Other Ambulatory Visit: Payer: Self-pay

## 2024-05-19 VITALS — BP 115/87 | HR 63 | Temp 98.1°F | Resp 17 | Ht 65.0 in | Wt 263.0 lb

## 2024-05-19 DIAGNOSIS — Z01818 Encounter for other preprocedural examination: Secondary | ICD-10-CM | POA: Insufficient documentation

## 2024-05-19 DIAGNOSIS — I10 Essential (primary) hypertension: Secondary | ICD-10-CM | POA: Insufficient documentation

## 2024-05-19 LAB — BASIC METABOLIC PANEL WITH GFR
Anion gap: 10 (ref 5–15)
BUN: 10 mg/dL (ref 6–20)
CO2: 26 mmol/L (ref 22–32)
Calcium: 8.6 mg/dL — ABNORMAL LOW (ref 8.9–10.3)
Chloride: 101 mmol/L (ref 98–111)
Creatinine, Ser: 1.13 mg/dL — ABNORMAL HIGH (ref 0.44–1.00)
GFR, Estimated: >60 mL/min (ref >60)
Glucose, Bld: 106 mg/dL — ABNORMAL HIGH (ref 70–99)
Potassium: 3.3 mmol/L — ABNORMAL LOW (ref 3.5–5.1)
Sodium: 137 mmol/L (ref 135–145)

## 2024-05-19 LAB — CBC
HCT: 39 % (ref 36.0–46.0)
Hemoglobin: 12.6 g/dL (ref 12.0–15.0)
MCH: 30.9 pg (ref 26.0–34.0)
MCHC: 32.3 g/dL (ref 30.0–36.0)
MCV: 95.6 fL (ref 80.0–100.0)
Platelets: 329 K/uL (ref 150–400)
RBC: 4.08 MIL/uL (ref 3.87–5.11)
RDW: 14.1 % (ref 11.5–15.5)
WBC: 5.4 K/uL (ref 4.0–10.5)
nRBC: 0 % (ref 0.0–0.2)

## 2024-05-19 NOTE — Progress Notes (Signed)
 PCP - Dr. Asberry Gals Cardiologist - denies  PPM/ICD - denies   Chest x-ray - 11/28/21 EKG - 05/19/24 Stress Test - denies ECHO - denies Cardiac Cath - denies  Sleep Study - denies   DM- denies  Last dose of GLP1 agonist-  n/a   ASA/Blood Thinner Instructions: n/a   ERAS Protcol - clears until 0430   COVID TEST- n/a   Anesthesia review: no  Patient denies shortness of breath, fever, cough and chest pain at PAT appointment   All instructions explained to the patient, with a verbal understanding of the material. Patient agrees to go over the instructions while at home for a better understanding.  The opportunity to ask questions was provided.

## 2024-05-25 NOTE — Anesthesia Preprocedure Evaluation (Signed)
 Anesthesia Evaluation  Patient identified by MRN, date of birth, ID band Patient awake    Reviewed: Allergy & Precautions, H&P , NPO status , Patient's Chart, lab work & pertinent test results, reviewed documented beta blocker date and time   Airway Mallampati: III  TM Distance: >3 FB Neck ROM: Full    Dental  (+) Dental Advisory Given, Teeth Intact   Pulmonary asthma , former smoker Quit smoking 02/2022 Rescue inhaler about once a month Snores at night, has never had sleep study    Pulmonary exam normal breath sounds clear to auscultation       Cardiovascular hypertension, Pt. on medications and Pt. on home beta blockers Normal cardiovascular exam Rhythm:Regular Rate:Normal     Neuro/Psych negative neurological ROS  negative psych ROS   GI/Hepatic negative GI ROS, Neg liver ROS,,,  Endo/Other    Class 3 obesity  Renal/GU Renal disease (L renal atrophy)Cr 1.1  negative genitourinary   Musculoskeletal negative musculoskeletal ROS (+)    Abdominal  (+) + obese  Peds negative pediatric ROS (+)  Hematology  (+) Blood dyscrasia, anemia Hb 11.8   Anesthesia Other Findings   Reproductive/Obstetrics negative OB ROS                              Anesthesia Physical Anesthesia Plan  ASA: 3  Anesthesia Plan: General   Post-op Pain Management: Tylenol  PO (pre-op)*, Celebrex  PO (pre-op)* and Gabapentin  PO (pre-op)*   Induction: Intravenous  PONV Risk Score and Plan: 4 or greater and Scopolamine  patch - Pre-op, Midazolam , Dexamethasone , Diphenhydramine , Treatment may vary due to age or medical condition and Ondansetron   Airway Management Planned: Oral ETT  Additional Equipment: None  Intra-op Plan:   Post-operative Plan: Extubation in OR  Informed Consent: I have reviewed the patients History and Physical, chart, labs and discussed the procedure including the risks, benefits and  alternatives for the proposed anesthesia with the patient or authorized representative who has indicated his/her understanding and acceptance.     Dental advisory given  Plan Discussed with: CRNA  Anesthesia Plan Comments: (Allergies to toradol , reglan, zofran - per pt takes zofran  sublingual without issue, isnt sure that the hives were really d/t zofran  )         Anesthesia Quick Evaluation

## 2024-05-26 ENCOUNTER — Encounter (HOSPITAL_COMMUNITY): Payer: Self-pay | Admitting: General Surgery

## 2024-05-26 ENCOUNTER — Ambulatory Visit (HOSPITAL_COMMUNITY): Payer: Self-pay | Admitting: Certified Registered Nurse Anesthetist

## 2024-05-26 ENCOUNTER — Encounter (HOSPITAL_COMMUNITY)
Admission: RE | Disposition: A | Discharge status: Home / Self Care | Payer: Self-pay | Source: Ambulatory Visit | Attending: General Surgery

## 2024-05-26 ENCOUNTER — Inpatient Hospital Stay (HOSPITAL_COMMUNITY)
Admission: RE | Admit: 2024-05-26 | Discharge: 2024-06-02 | DRG: 354 | Disposition: A | Discharge status: Home / Self Care | Attending: General Surgery | Admitting: General Surgery

## 2024-05-26 ENCOUNTER — Other Ambulatory Visit: Payer: Self-pay

## 2024-05-26 DIAGNOSIS — J45909 Unspecified asthma, uncomplicated: Secondary | ICD-10-CM | POA: Diagnosis present

## 2024-05-26 DIAGNOSIS — Z8249 Family history of ischemic heart disease and other diseases of the circulatory system: Secondary | ICD-10-CM

## 2024-05-26 DIAGNOSIS — Z886 Allergy status to analgesic agent status: Secondary | ICD-10-CM

## 2024-05-26 DIAGNOSIS — K432 Incisional hernia without obstruction or gangrene: Principal | ICD-10-CM | POA: Diagnosis present

## 2024-05-26 DIAGNOSIS — Z87891 Personal history of nicotine dependence: Secondary | ICD-10-CM

## 2024-05-26 DIAGNOSIS — Z9049 Acquired absence of other specified parts of digestive tract: Secondary | ICD-10-CM

## 2024-05-26 DIAGNOSIS — Z79899 Other long term (current) drug therapy: Secondary | ICD-10-CM

## 2024-05-26 DIAGNOSIS — Z01818 Encounter for other preprocedural examination: Secondary | ICD-10-CM

## 2024-05-26 DIAGNOSIS — I10 Essential (primary) hypertension: Secondary | ICD-10-CM | POA: Diagnosis not present

## 2024-05-26 DIAGNOSIS — Z885 Allergy status to narcotic agent status: Secondary | ICD-10-CM

## 2024-05-26 DIAGNOSIS — E876 Hypokalemia: Secondary | ICD-10-CM | POA: Diagnosis not present

## 2024-05-26 DIAGNOSIS — Z87442 Personal history of urinary calculi: Secondary | ICD-10-CM

## 2024-05-26 DIAGNOSIS — Z905 Acquired absence of kidney: Secondary | ICD-10-CM

## 2024-05-26 DIAGNOSIS — Z881 Allergy status to other antibiotic agents status: Secondary | ICD-10-CM

## 2024-05-26 DIAGNOSIS — Z888 Allergy status to other drugs, medicaments and biological substances status: Secondary | ICD-10-CM

## 2024-05-26 DIAGNOSIS — N179 Acute kidney failure, unspecified: Secondary | ICD-10-CM | POA: Diagnosis present

## 2024-05-26 DIAGNOSIS — Z91041 Radiographic dye allergy status: Secondary | ICD-10-CM

## 2024-05-26 LAB — CBC
HCT: 37.5 % (ref 36.0–46.0)
Hemoglobin: 12.3 g/dL (ref 12.0–15.0)
MCH: 31.5 pg (ref 26.0–34.0)
MCHC: 32.8 g/dL (ref 30.0–36.0)
MCV: 95.9 fL (ref 80.0–100.0)
Platelets: 304 K/uL (ref 150–400)
RBC: 3.91 MIL/uL (ref 3.87–5.11)
RDW: 14.3 % (ref 11.5–15.5)
WBC: 13.3 K/uL — ABNORMAL HIGH (ref 4.0–10.5)
nRBC: 0 % (ref 0.0–0.2)

## 2024-05-26 LAB — CREATININE, SERUM
Creatinine, Ser: 1.4 mg/dL — ABNORMAL HIGH (ref 0.44–1.00)
GFR, Estimated: 47 mL/min — ABNORMAL LOW (ref >60)

## 2024-05-26 LAB — POCT PREGNANCY, URINE: Preg Test, Ur: NEGATIVE

## 2024-05-26 SURGERY — REPAIR, HERNIA, FLANK
Anesthesia: General | Laterality: Right

## 2024-05-26 MED ORDER — SUGAMMADEX SODIUM 200 MG/2ML IV SOLN
INTRAVENOUS | Status: DC | PRN
  Administered 2024-05-26: 400 mg via INTRAVENOUS

## 2024-05-26 MED ORDER — PROCHLORPERAZINE EDISYLATE 10 MG/2ML IJ SOLN
10.0000 mg | Freq: Four times a day (QID) | INTRAMUSCULAR | Status: DC | PRN
  Administered 2024-05-26 – 2024-05-31 (×7): 10 mg via INTRAVENOUS
  Filled 2024-05-26 (×6): qty 2

## 2024-05-26 MED ORDER — NALOXONE HCL 0.4 MG/ML IJ SOLN: 0.4000 mg | INTRAMUSCULAR | Status: DC | PRN

## 2024-05-26 MED ORDER — MIDAZOLAM HCL 2 MG/2ML IJ SOLN
INTRAMUSCULAR | Status: AC
Start: 2024-05-26 — End: 2024-05-26
  Filled 2024-05-26: qty 2

## 2024-05-26 MED ORDER — SCOPOLAMINE 1 MG/3DAYS TD PT72
1.0000 | MEDICATED_PATCH | TRANSDERMAL | Status: DC
  Administered 2024-05-26: 1.5 mg via TRANSDERMAL
  Filled 2024-05-26: qty 1

## 2024-05-26 MED ORDER — ONDANSETRON HCL 4 MG/2ML IJ SOLN
4.0000 mg | Freq: Four times a day (QID) | INTRAMUSCULAR | Status: DC | PRN
  Administered 2024-05-26 – 2024-05-27 (×3): 4 mg via INTRAVENOUS
  Filled 2024-05-26 (×3): qty 2

## 2024-05-26 MED ORDER — ONDANSETRON HCL 4 MG/2ML IJ SOLN
INTRAMUSCULAR | Status: AC
  Filled 2024-05-26: qty 2

## 2024-05-26 MED ORDER — CEFAZOLIN SODIUM-DEXTROSE 2-4 GM/100ML-% IV SOLN
INTRAVENOUS | Status: AC
  Filled 2024-05-26: qty 100

## 2024-05-26 MED ORDER — ORAL CARE MOUTH RINSE: 15.0000 mL | Freq: Once | OROMUCOSAL | Status: AC

## 2024-05-26 MED ORDER — FENTANYL CITRATE (PF) 250 MCG/5ML IJ SOLN
INTRAMUSCULAR | Status: DC | PRN
  Administered 2024-05-26: 50 ug via INTRAVENOUS
  Administered 2024-05-26: 100 ug via INTRAVENOUS
  Administered 2024-05-26 (×5): 50 ug via INTRAVENOUS

## 2024-05-26 MED ORDER — ACETAMINOPHEN 500 MG PO TABS
1000.0000 mg | ORAL_TABLET | Freq: Four times a day (QID) | ORAL | Status: DC
  Administered 2024-05-28 – 2024-06-01 (×15): 1000 mg via ORAL
  Filled 2024-05-26 (×15): qty 2

## 2024-05-26 MED ORDER — FENTANYL CITRATE (PF) 100 MCG/2ML IJ SOLN
25.0000 ug | INTRAMUSCULAR | Status: DC | PRN
  Administered 2024-05-26 (×2): 50 ug via INTRAVENOUS

## 2024-05-26 MED ORDER — DIPHENHYDRAMINE HCL 12.5 MG/5ML PO ELIX
12.5000 mg | ORAL_SOLUTION | Freq: Four times a day (QID) | ORAL | Status: DC | PRN
  Administered 2024-05-27 (×2): 12.5 mg via ORAL
  Filled 2024-05-26 (×2): qty 10

## 2024-05-26 MED ORDER — CELECOXIB 200 MG PO CAPS
200.0000 mg | ORAL_CAPSULE | Freq: Once | ORAL | Status: AC
  Administered 2024-05-26: 200 mg via ORAL
  Filled 2024-05-26: qty 1

## 2024-05-26 MED ORDER — ACETAMINOPHEN 500 MG PO TABS
1000.0000 mg | ORAL_TABLET | Freq: Once | ORAL | Status: AC
  Administered 2024-05-26: 1000 mg via ORAL
  Filled 2024-05-26: qty 2

## 2024-05-26 MED ORDER — MIDAZOLAM HCL 2 MG/2ML IJ SOLN
INTRAMUSCULAR | Status: DC | PRN
  Administered 2024-05-26: 2 mg via INTRAVENOUS

## 2024-05-26 MED ORDER — DIPHENHYDRAMINE HCL 50 MG/ML IJ SOLN
12.5000 mg | Freq: Four times a day (QID) | INTRAMUSCULAR | Status: DC | PRN
  Administered 2024-05-26 – 2024-05-28 (×2): 12.5 mg via INTRAVENOUS
  Filled 2024-05-26 (×2): qty 1

## 2024-05-26 MED ORDER — DIPHENHYDRAMINE HCL 50 MG/ML IJ SOLN
12.5000 mg | Freq: Once | INTRAMUSCULAR | Status: AC
  Administered 2024-05-26: 12.5 mg via INTRAVENOUS
  Filled 2024-05-26: qty 1

## 2024-05-26 MED ORDER — FENTANYL CITRATE (PF) 250 MCG/5ML IJ SOLN
INTRAMUSCULAR | Status: AC
  Filled 2024-05-26: qty 5

## 2024-05-26 MED ORDER — FENTANYL CITRATE (PF) 100 MCG/2ML IJ SOLN
INTRAMUSCULAR | Status: AC
Start: 2024-05-26 — End: 2024-05-26
  Filled 2024-05-26: qty 2

## 2024-05-26 MED ORDER — HYDROMORPHONE 1 MG/ML IV SOLN
INTRAVENOUS | Status: AC
  Filled 2024-05-26: qty 30

## 2024-05-26 MED ORDER — DIPHENHYDRAMINE HCL 50 MG/ML IJ SOLN
INTRAMUSCULAR | Status: AC
  Filled 2024-05-26: qty 1

## 2024-05-26 MED ORDER — LACTATED RINGERS IV SOLN: INTRAVENOUS | Status: DC

## 2024-05-26 MED ORDER — OXYCODONE HCL 5 MG PO TABS
5.0000 mg | ORAL_TABLET | ORAL | Status: DC | PRN
  Administered 2024-05-28 – 2024-05-31 (×20): 10 mg via ORAL
  Filled 2024-05-26 (×12): qty 2

## 2024-05-26 MED ORDER — ROCURONIUM BROMIDE 10 MG/ML (PF) SYRINGE
PREFILLED_SYRINGE | INTRAVENOUS | Status: AC
  Filled 2024-05-26: qty 10

## 2024-05-26 MED ORDER — VISTASEAL 10 ML EX PSKT
10.0000 mL | PREFILLED_SYRINGE | CUTANEOUS | Status: DC
Start: 2024-05-26 — End: 2024-05-26
  Filled 2024-05-26: qty 10

## 2024-05-26 MED ORDER — DOCUSATE SODIUM 100 MG PO CAPS
100.0000 mg | ORAL_CAPSULE | Freq: Two times a day (BID) | ORAL | Status: DC
  Administered 2024-05-26 – 2024-06-01 (×18): 100 mg via ORAL
  Filled 2024-05-26 (×13): qty 1

## 2024-05-26 MED ORDER — GABAPENTIN 300 MG PO CAPS
300.0000 mg | ORAL_CAPSULE | ORAL | Status: AC
  Administered 2024-05-26: 300 mg via ORAL
  Filled 2024-05-26: qty 1

## 2024-05-26 MED ORDER — SODIUM CHLORIDE 0.9% FLUSH: 9.0000 mL | INTRAVENOUS | Status: DC | PRN

## 2024-05-26 MED ORDER — OXYCODONE HCL 5 MG PO TABS: 5.0000 mg | ORAL_TABLET | Freq: Once | ORAL | Status: DC | PRN

## 2024-05-26 MED ORDER — ONDANSETRON HCL 4 MG/2ML IJ SOLN
INTRAMUSCULAR | Status: DC | PRN
  Administered 2024-05-26: 4 mg via INTRAVENOUS

## 2024-05-26 MED ORDER — CHLORHEXIDINE GLUCONATE 0.12 % MT SOLN
15.0000 mL | Freq: Once | OROMUCOSAL | Status: AC
  Administered 2024-05-26: 15 mL via OROMUCOSAL
  Filled 2024-05-26: qty 15

## 2024-05-26 MED ORDER — CEFAZOLIN SODIUM-DEXTROSE 2-3 GM-%(50ML) IV SOLR
INTRAVENOUS | Status: DC | PRN
  Administered 2024-05-26: 2 g via INTRAVENOUS

## 2024-05-26 MED ORDER — PROPOFOL 10 MG/ML IV BOLUS
INTRAVENOUS | Status: AC
  Filled 2024-05-26: qty 20

## 2024-05-26 MED ORDER — LIDOCAINE 2% (20 MG/ML) 5 ML SYRINGE
INTRAMUSCULAR | Status: DC | PRN
  Administered 2024-05-26: 100 mg via INTRAVENOUS

## 2024-05-26 MED ORDER — METHOCARBAMOL 1000 MG/10ML IJ SOLN: 500.0000 mg | Freq: Three times a day (TID) | INTRAMUSCULAR | Status: DC | PRN

## 2024-05-26 MED ORDER — AMISULPRIDE (ANTIEMETIC) 5 MG/2ML IV SOLN
INTRAVENOUS | Status: AC
  Filled 2024-05-26: qty 4

## 2024-05-26 MED ORDER — OXYCODONE HCL 5 MG/5ML PO SOLN: 5.0000 mg | Freq: Once | ORAL | Status: DC | PRN

## 2024-05-26 MED ORDER — KETAMINE HCL 10 MG/ML IJ SOLN
INTRAMUSCULAR | Status: DC | PRN
  Administered 2024-05-26: 40 mg via INTRAVENOUS

## 2024-05-26 MED ORDER — ROCURONIUM BROMIDE 10 MG/ML (PF) SYRINGE
PREFILLED_SYRINGE | INTRAVENOUS | Status: DC | PRN
  Administered 2024-05-26: 30 mg via INTRAVENOUS
  Administered 2024-05-26: 20 mg via INTRAVENOUS
  Administered 2024-05-26: 100 mg via INTRAVENOUS

## 2024-05-26 MED ORDER — ONDANSETRON HCL 4 MG/2ML IJ SOLN: 4.0000 mg | Freq: Once | INTRAMUSCULAR | Status: DC | PRN

## 2024-05-26 MED ORDER — LIDOCAINE 2% (20 MG/ML) 5 ML SYRINGE
INTRAMUSCULAR | Status: AC
  Filled 2024-05-26: qty 5

## 2024-05-26 MED ORDER — METHOCARBAMOL 500 MG PO TABS
500.0000 mg | ORAL_TABLET | Freq: Three times a day (TID) | ORAL | Status: DC | PRN
  Administered 2024-05-28 – 2024-05-29 (×5): 500 mg via ORAL
  Filled 2024-05-26 (×3): qty 1

## 2024-05-26 MED ORDER — PROPOFOL 10 MG/ML IV BOLUS
INTRAVENOUS | Status: DC | PRN
  Administered 2024-05-26: 200 mg via INTRAVENOUS

## 2024-05-26 MED ORDER — BUPIVACAINE-EPINEPHRINE 0.25% -1:200000 IJ SOLN
INTRAMUSCULAR | Status: DC | PRN
Start: 2024-05-26 — End: 2024-05-26
  Administered 2024-05-26: 30 mL

## 2024-05-26 MED ORDER — PHENYLEPHRINE 80 MCG/ML (10ML) SYRINGE FOR IV PUSH (FOR BLOOD PRESSURE SUPPORT)
PREFILLED_SYRINGE | INTRAVENOUS | Status: DC | PRN
  Administered 2024-05-26: 160 ug via INTRAVENOUS

## 2024-05-26 MED ORDER — DEXAMETHASONE SODIUM PHOSPHATE 10 MG/ML IJ SOLN
INTRAMUSCULAR | Status: DC | PRN
  Administered 2024-05-26: 10 mg via INTRAVENOUS

## 2024-05-26 MED ORDER — MEPERIDINE HCL 25 MG/ML IJ SOLN: 6.2500 mg | INTRAMUSCULAR | Status: DC | PRN

## 2024-05-26 MED ORDER — BUPIVACAINE-EPINEPHRINE (PF) 0.25% -1:200000 IJ SOLN
INTRAMUSCULAR | Status: AC
  Filled 2024-05-26: qty 30

## 2024-05-26 MED ORDER — 0.9 % SODIUM CHLORIDE (POUR BTL) OPTIME
TOPICAL | Status: DC | PRN
Start: 2024-05-26 — End: 2024-05-26
  Administered 2024-05-26 (×2): 1000 mL

## 2024-05-26 MED ORDER — HYDROMORPHONE 1 MG/ML IV SOLN
INTRAVENOUS | Status: DC
  Administered 2024-05-26: 30 mg via INTRAVENOUS
  Administered 2024-05-26: 3.3 mg via INTRAVENOUS
  Administered 2024-05-27: 3 mg via INTRAVENOUS
  Filled 2024-05-26: qty 30

## 2024-05-26 MED ORDER — ALBUTEROL SULFATE (2.5 MG/3ML) 0.083% IN NEBU
2.5000 mg | INHALATION_SOLUTION | Freq: Four times a day (QID) | RESPIRATORY_TRACT | Status: DC | PRN
  Administered 2024-05-29 (×2): 2.5 mg via RESPIRATORY_TRACT
  Filled 2024-05-26: qty 3

## 2024-05-26 MED ORDER — VISTASEAL 10 ML SINGLE DOSE KIT
PACK | CUTANEOUS | Status: DC | PRN
  Administered 2024-05-26: 10 mL via TOPICAL

## 2024-05-26 MED ORDER — POLYETHYLENE GLYCOL 3350 17 G PO PACK: 17.0000 g | PACK | Freq: Every day | ORAL | Status: DC | PRN

## 2024-05-26 MED ORDER — ENOXAPARIN SODIUM 40 MG/0.4ML IJ SOSY
40.0000 mg | PREFILLED_SYRINGE | INTRAMUSCULAR | Status: DC
  Administered 2024-05-27 – 2024-06-02 (×10): 40 mg via SUBCUTANEOUS
  Filled 2024-05-26 (×7): qty 0.4

## 2024-05-26 MED ORDER — AMISULPRIDE (ANTIEMETIC) 5 MG/2ML IV SOLN
10.0000 mg | Freq: Once | INTRAVENOUS | Status: AC
  Administered 2024-05-26: 10 mg via INTRAVENOUS

## 2024-05-26 MED ORDER — DEXAMETHASONE SODIUM PHOSPHATE 10 MG/ML IJ SOLN
INTRAMUSCULAR | Status: AC
  Filled 2024-05-26: qty 1

## 2024-05-26 SURGICAL SUPPLY — 43 items
BIOPATCH RED 1 DISK 7.0 (GAUZE/BANDAGES/DRESSINGS) IMPLANT
CANISTER SUCTION 3000ML PPV (SUCTIONS) ×1 IMPLANT
CHLORAPREP W/TINT 26 (MISCELLANEOUS) ×1 IMPLANT
CONNECTOR 5 IN 1 STRAIGHT STRL (MISCELLANEOUS) IMPLANT
COVER SURGICAL LIGHT HANDLE (MISCELLANEOUS) ×1 IMPLANT
DERMABOND ADVANCED .7 DNX12 (GAUZE/BANDAGES/DRESSINGS) IMPLANT
DRAIN CHANNEL 19F RND (DRAIN) IMPLANT
DRAPE SURG ORHT 6 SPLT 77X108 (DRAPES) IMPLANT
DRAPE WARM FLUID 44X44 (DRAPES) IMPLANT
ELECTRODE BLDE 4.0 EZ CLN MEGD (MISCELLANEOUS) IMPLANT
ELECTRODE REM PT RTRN 9FT ADLT (ELECTROSURGICAL) ×1 IMPLANT
EVACUATOR SILICONE 100CC (DRAIN) IMPLANT
GAUZE 4X4 16PLY ~~LOC~~+RFID DBL (SPONGE) IMPLANT
GLOVE BIO SURGEON STRL SZ7 (GLOVE) ×1 IMPLANT
GLOVE BIOGEL PI IND STRL 7.5 (GLOVE) ×1 IMPLANT
GOWN STRL REUS W/ TWL LRG LVL3 (GOWN DISPOSABLE) ×1 IMPLANT
GOWN STRL REUS W/ TWL XL LVL3 (GOWN DISPOSABLE) ×1 IMPLANT
KIT BASIN OR (CUSTOM PROCEDURE TRAY) ×1 IMPLANT
KIT TURNOVER KIT B (KITS) ×1 IMPLANT
MESH SOFT 12X12IN BARD (Mesh General) IMPLANT
MESH VICRYL KNITTED 12X12 (Mesh General) IMPLANT
NDL HYPO 22X1.5 SAFETY MO (MISCELLANEOUS) IMPLANT
NEEDLE HYPO 22X1.5 SAFETY MO (MISCELLANEOUS) ×1 IMPLANT
NS IRRIG 1000ML POUR BTL (IV SOLUTION) ×1 IMPLANT
PACK GENERAL/GYN (CUSTOM PROCEDURE TRAY) ×1 IMPLANT
PAD ARMBOARD POSITIONER FOAM (MISCELLANEOUS) ×1 IMPLANT
SPONGE T-LAP 18X18 ~~LOC~~+RFID (SPONGE) IMPLANT
SUT MNCRL AB 4-0 PS2 18 (SUTURE) IMPLANT
SUT NOVA 1 T20/GS 25DT (SUTURE) IMPLANT
SUT NOVA NAB GS-21 1 T12 (SUTURE) IMPLANT
SUT PDS AB 0 CT1 27 (SUTURE) IMPLANT
SUT SILK 2 0 TIES 10X30 (SUTURE) IMPLANT
SUT SILK 3 0 TIES 10X30 (SUTURE) IMPLANT
SUT STRATAFIX 1PDS 45CM VIOLET (SUTURE) IMPLANT
SUT VIC AB 2-0 SH 27X BRD (SUTURE) IMPLANT
SUT VIC AB 2-0 SH 27XBRD (SUTURE) IMPLANT
SUT VIC AB 3-0 SH 27X BRD (SUTURE) IMPLANT
SUT VIC AB 3-0 SH 8-18 (SUTURE) IMPLANT
SYR CONTROL 10ML LL (SYRINGE) IMPLANT
TOWEL GREEN STERILE FF (TOWEL DISPOSABLE) ×1 IMPLANT
TOWEL ~~LOC~~+RFID 17X26 BLUE (SPONGE) IMPLANT
TRAY FOLEY W/BAG SLVR 16FR ST (SET/KITS/TRAYS/PACK) IMPLANT
TUBE CONNECTING 12X1/4 (SUCTIONS) IMPLANT

## 2024-05-26 NOTE — Anesthesia Postprocedure Evaluation (Signed)
 Anesthesia Post Note  Patient: [name]  Procedure(s) Performed: REPAIR, HERNIA, FLANK, WITH MESH (Right)     Patient location during evaluation: PACU Anesthesia Type: General Level of consciousness: awake and alert Pain management: pain level controlled Vital Signs Assessment: post-procedure vital signs reviewed and stable Respiratory status: spontaneous breathing, nonlabored ventilation, respiratory function stable and patient connected to nasal cannula oxygen Cardiovascular status: blood pressure returned to baseline and stable Postop Assessment: no apparent nausea or vomiting Anesthetic complications: no   No notable events documented.  Last Vitals:  Vitals:   05/26/24 1145 05/26/24 1200  BP: (!) 142/99 133/88  Pulse: 79 78  Resp: (!) 24 (!) 28  Temp:    SpO2: 91% 90%    Last Pain:  Vitals:   05/26/24 1155  TempSrc:   PainSc: 10-Worst pain ever                 Caz Weaver

## 2024-05-26 NOTE — Op Note (Addendum)
 05/26/2024  11:51 AM  PATIENT:  [name]  48 y.o. female  Patient Care Team: Juliene Asberry NOVAK, DO as PCP - General (Internal Medicine)  PRE-OPERATIVE DIAGNOSIS:  Right flank incisional hernia (12cm x 8cm)  POST-OPERATIVE DIAGNOSIS:  Same  PROCEDURE:   Open right flank incisional hernia repair (12 x 8cm) Right transverse abdominus release  Right retrorectus myofascial release (900 sq cm, 30 x 30)  SURGEON:  Cordella RONAL Idler, MD  ASSISTANT: Deward Eastern, MD A surgeon assistant was essential to the completion of the case given the complexity of the operation. The assistant aided with retraction, dissection, and identification of anatomy.   ANESTHESIA:   general  COUNTS:  Sponge, needle and instrument counts were reported correct x2 at the conclusion of the operation.  EBL: 75mL  DRAINS: 19 Fr blake in the subcutaneous space  SPECIMEN: None  COMPLICATIONS: None  FINDINGS: Right flank incisional hernia reduced on induction. Peritoneal defect measured 12cm x 8cm. Bard soft mesh implanted in the retromuscular space (30cm x 30cm)  DISPOSITION: PACU in satisfactory condition  INDICATION: [name] is a 48 y/o F who underwent a right nephrectomy last year for nephrolithiasis and renal atrophy. She developed a right flank hernia that became increasingly symptomatic so I offered her repair. All of her questions were addressed and written consent was obtained.   DESCRIPTION: The patient was identified in preop holding and taken to the OR where she was placed on the operating room table. SCDs were placed. General endotracheal anesthesia was induced without difficulty. A foley was placed. She was then placed in the left lateral decubitus position and padded appropriately. The right flank and abdomen  was then prepped and draped in the usual sterile fashion. A surgical timeout was performed indicating the correct patient, procedure, positioning and need for preoperative  antibiotics.   With the patient under anesthesia we were able to palpate the fascial defect, which was cephalad and medial to her skin incision from the prior extraction site. An incision overlying the hernia was made using a #10 blade and carried down through subcutaneous tissue using electrocautery. We soon encountered the hernia sac. The hernia sac was entered. The underlying bowel was visible and appeared healthy. We dissected the hernia sac free from the surrounding tissues and ligated it. The hernia defect was evaluated and a few adhesions were lysed. We incised the peritoneum approximately 1-cm from the hernia edge and created flaps using a combination of blunt dissection and selective electrocautery. We created space laterally toward the spine. As we moved medial to the hernia we were able to identify the transversus abdominus muscles and the rectus muscles. We entered the retrorectus space and carefully separated the muscles from the underlying fascia out toward the midline. In order to gain access to this space we had to release the cephalad aspect of the transversus abdominus muscle but were able to preserve the inferior aspect. In total, we were able to release approximately 900 sq cm (30cm x 30cm) to allow for mesh placement. Following reduction of the hernia, the peritoneal defect was measured and found to be 12cm x 8cm.  The peritoneum was bridged closed using a vicryl mesh with a running 2-0 vicryl suture, creating a barrier between the mesh and the bowel. We then brought a 30cm x 30cm piece of bard soft to the field and placed it in a retromuscular position. The mesh layed flat and filled the space nicely. A quadratus lumborum block was performed using 0.25%  marcaine  with epinephrine . The fascia was closed using #1 spiral stratafix suture. The subcutaneous space was irrigated and examined for hemostasis. A 19 Fr blake drain was then brought to the field and placed within the subcutaneous space and  secured with 2-0 nylon. The skin was closed in multiple layers, using 2-0 running vicryl for a deep dermal layer, followed by interrupted 3-0 vicryl, and lastly a 4-0 running monocryl. A final dressing of dermabond was applied.

## 2024-05-26 NOTE — Anesthesia Procedure Notes (Signed)
 Procedure Name: Intubation Date/Time: 05/26/2024 7:33 AM  Performed by: Lyle Delon LABOR, CRNAPre-anesthesia Checklist: Patient identified, Emergency Drugs available, Suction available and Patient being monitored Patient Re-evaluated:Patient Re-evaluated prior to induction Oxygen Delivery Method: Circle system utilized Preoxygenation: Pre-oxygenation with 100% oxygen Induction Type: IV induction Ventilation: Mask ventilation without difficulty Laryngoscope Size: Glidescope and 3 Grade View: Grade I Tube type: Oral Tube size: 7.0 mm Number of attempts: 1 Airway Equipment and Method: Stylet and Oral airway Placement Confirmation: ETT inserted through vocal cords under direct vision, positive ETCO2 and breath sounds checked- equal and bilateral Secured at: 21 cm Tube secured with: Tape Dental Injury: Teeth and Oropharynx as per pre-operative assessment

## 2024-05-26 NOTE — H&P (Signed)
 [name] [birth year]  [id number].    HPI:  48 y/o F w/ a hx of nephrolithiasis s/p right nephrectomy  last year c/b incisional/flank hernia who presents for elective repair. She reports that she is in her usual state of health and denies any recent changes in medication.   ROS: Review of Systems  Constitutional: Negative.   HENT: Negative.    Eyes: Negative.   Respiratory: Negative.    Cardiovascular: Negative.   Gastrointestinal: Negative.   Genitourinary: Negative.   Musculoskeletal: Negative.   Skin: Negative.   Neurological: Negative.   Endo/Heme/Allergies: Negative.   Psychiatric/Behavioral: Negative.      Family History  Problem Relation Age of Onset   HIV/AIDS Mother    Hypertension Father     Past Medical History:  Diagnosis Date   Asthma    Bacterial vaginosis    Candidal vaginitis    Chronic UTI    History of kidney stones    Hypertension    Kidney stones    Ovarian cyst    Pyelonephritis, chronic     Past Surgical History:  Procedure Laterality Date   APPENDECTOMY     CYSTOSCOPY  2001-2012   total of 11; for chronic chronic pyelonephritis   LITHOTRIPSY  06/20/1999   nephrostomy     ROBOT ASSISTED LAPAROSCOPIC NEPHRECTOMY Right 04/09/2023   Procedure: XI ROBOTIC ASSISTED RIGHT LAPAROSCOPIC NEPHRECTOMY;  Surgeon: Devere Lonni Righter, MD;  Location: [facility];  Service: Urology;  Laterality: Right;   TUBAL LIGATION  10/19/2000    Social History:  reports that she quit smoking about 2 years ago. Her smoking use included cigarettes. She started smoking about 20 years ago. She has a 4.5 pack-year smoking history. She has never used smokeless tobacco. She reports that she does not drink alcohol and does not use drugs.  Allergies:  Allergies  Allergen Reactions   Flagyl [Metronidazole Hcl] Hives   Ketorolac  Tromethamine  Hives   Metoclopramide Hcl Other (See Comments)    Facial spasm; they thought I was having a stroke   Percocet  [Oxycodone -Acetaminophen ] Hives and Itching   Barium Sulfate Itching    Medications Prior to Admission  Medication Sig Dispense Refill   acetaminophen  (TYLENOL ) 500 MG tablet Take 500-1,000 mg by mouth every 6 (six) hours as needed (pain.).     albuterol  (PROVENTIL  HFA;VENTOLIN  HFA) 108 (90 BASE) MCG/ACT inhaler Inhale 1-2 puffs into the lungs every 6 (six) hours as needed for wheezing or shortness of breath. For wheezing     hydrochlorothiazide  (HYDRODIURIL ) 25 MG tablet Take 25 mg by mouth in the morning.     metoprolol  succinate (TOPROL -XL) 50 MG 24 hr tablet Take 50 mg by mouth in the morning.     nystatin (MYCOSTATIN/NYSTOP) powder Apply 1 Application topically 2 (two) times daily as needed (skin irritation.).      Physical Exam: Blood pressure (!) 155/99, pulse 70, temperature 98.4 F (36.9 C), temperature source Oral, resp. rate 18, height 5' 5 (1.651 m), weight 118.8 kg, last menstrual period 05/10/2024, SpO2 97%. Gen: female, NAD Abd: soft, non-distended, right-sided incision well healed with appreciable bulge  Results for orders placed or performed during the hospital encounter of 05/26/24 (from the past 48 hours)  Pregnancy, urine POC     Status: None   Collection Time: 05/26/24  6:29 AM  Result Value Ref Range   Preg Test, Ur NEGATIVE NEGATIVE    Comment:        THE SENSITIVITY OF  THIS METHODOLOGY IS >20 mIU/mL.    No results found.  Assessment/Plan 48 y/o F w/ a right flank hernia  - Will proceed to the OR. We discussed the alternatives and potential risks of surgery, including but not limited to: bleeding, infection, damage to bowel or surrounding structures, mesh complications, chronic pain, recurrent hernia, and need for additional procedures. All questions were addressed and consent was obtained.    [name] Surgery 05/26/2024, 6:54 AM Please see Amion for pager number during day hours 7:00am-4:30pm or 7:00am -11:30am on weekends

## 2024-05-26 NOTE — Transfer of Care (Signed)
 Immediate Anesthesia Transfer of Care Note  Patient: [name]  Procedure(s) Performed: REPAIR, HERNIA, FLANK, WITH MESH (Right)  Patient Location: PACU  Anesthesia Type:General  Level of Consciousness: drowsy and patient cooperative  Airway & Oxygen Therapy: Patient Spontanous Breathing and Patient connected to face mask oxygen  Post-op Assessment: Report given to RN and Post -op Vital signs reviewed and stable  Post vital signs: Reviewed and stable  Last Vitals:  Vitals Value Taken Time  BP 142/96 05/26/24 11:21  Temp    Pulse 76 05/26/24 11:29  Resp 15 05/26/24 11:29  SpO2 95 % 05/26/24 11:29  Vitals shown include unfiled device data.  Last Pain:  Vitals:   05/26/24 0622  TempSrc:   PainSc: 0-No pain      Patients Stated Pain Goal: 0 (05/26/24 0622)  Complications: No notable events documented.

## 2024-05-27 ENCOUNTER — Other Ambulatory Visit: Payer: Self-pay

## 2024-05-27 DIAGNOSIS — Z91041 Radiographic dye allergy status: Secondary | ICD-10-CM | POA: Diagnosis not present

## 2024-05-27 DIAGNOSIS — Z9049 Acquired absence of other specified parts of digestive tract: Secondary | ICD-10-CM | POA: Diagnosis not present

## 2024-05-27 DIAGNOSIS — Z8249 Family history of ischemic heart disease and other diseases of the circulatory system: Secondary | ICD-10-CM | POA: Diagnosis not present

## 2024-05-27 DIAGNOSIS — I10 Essential (primary) hypertension: Secondary | ICD-10-CM | POA: Diagnosis present

## 2024-05-27 DIAGNOSIS — Z87442 Personal history of urinary calculi: Secondary | ICD-10-CM | POA: Diagnosis not present

## 2024-05-27 DIAGNOSIS — Z87891 Personal history of nicotine dependence: Secondary | ICD-10-CM | POA: Diagnosis not present

## 2024-05-27 DIAGNOSIS — K432 Incisional hernia without obstruction or gangrene: Secondary | ICD-10-CM | POA: Diagnosis present

## 2024-05-27 DIAGNOSIS — Z888 Allergy status to other drugs, medicaments and biological substances status: Secondary | ICD-10-CM | POA: Diagnosis not present

## 2024-05-27 DIAGNOSIS — Z79899 Other long term (current) drug therapy: Secondary | ICD-10-CM | POA: Diagnosis not present

## 2024-05-27 DIAGNOSIS — Z881 Allergy status to other antibiotic agents status: Secondary | ICD-10-CM | POA: Diagnosis not present

## 2024-05-27 DIAGNOSIS — E876 Hypokalemia: Secondary | ICD-10-CM | POA: Diagnosis not present

## 2024-05-27 DIAGNOSIS — Z885 Allergy status to narcotic agent status: Secondary | ICD-10-CM | POA: Diagnosis not present

## 2024-05-27 DIAGNOSIS — N179 Acute kidney failure, unspecified: Secondary | ICD-10-CM | POA: Diagnosis present

## 2024-05-27 DIAGNOSIS — Z905 Acquired absence of kidney: Secondary | ICD-10-CM | POA: Diagnosis not present

## 2024-05-27 DIAGNOSIS — Z886 Allergy status to analgesic agent status: Secondary | ICD-10-CM | POA: Diagnosis not present

## 2024-05-27 DIAGNOSIS — J45909 Unspecified asthma, uncomplicated: Secondary | ICD-10-CM | POA: Diagnosis present

## 2024-05-27 LAB — CBC
HCT: 34.1 % — ABNORMAL LOW (ref 36.0–46.0)
Hemoglobin: 11.2 g/dL — ABNORMAL LOW (ref 12.0–15.0)
MCH: 31.9 pg (ref 26.0–34.0)
MCHC: 32.8 g/dL (ref 30.0–36.0)
MCV: 97.2 fL (ref 80.0–100.0)
Platelets: 196 K/uL (ref 150–400)
RBC: 3.51 MIL/uL — ABNORMAL LOW (ref 3.87–5.11)
RDW: 14.3 % (ref 11.5–15.5)
WBC: 11 K/uL — ABNORMAL HIGH (ref 4.0–10.5)
nRBC: 0.4 % — ABNORMAL HIGH (ref 0.0–0.2)

## 2024-05-27 LAB — BASIC METABOLIC PANEL WITH GFR
Anion gap: 14 (ref 5–15)
BUN: 13 mg/dL (ref 6–20)
CO2: 21 mmol/L — ABNORMAL LOW (ref 22–32)
Calcium: 8.1 mg/dL — ABNORMAL LOW (ref 8.9–10.3)
Chloride: 101 mmol/L (ref 98–111)
Creatinine, Ser: 1.16 mg/dL — ABNORMAL HIGH (ref 0.44–1.00)
GFR, Estimated: 59 mL/min — ABNORMAL LOW (ref >60)
Glucose, Bld: 138 mg/dL — ABNORMAL HIGH (ref 70–99)
Potassium: 4.1 mmol/L (ref 3.5–5.1)
Sodium: 136 mmol/L (ref 135–145)

## 2024-05-27 MED ORDER — HYDROXYZINE HCL 10 MG PO TABS
10.0000 mg | ORAL_TABLET | Freq: Three times a day (TID) | ORAL | Status: DC | PRN
  Administered 2024-05-27 – 2024-06-02 (×5): 10 mg via ORAL
  Filled 2024-05-27 (×6): qty 1

## 2024-05-27 NOTE — Plan of Care (Deleted)
   Problem: Health Behavior/Discharge Planning: Goal: Ability to manage health-related needs will improve Outcome: Progressing   Problem: Activity: Goal: Risk for activity intolerance will decrease Outcome: Progressing   Problem: Coping: Goal: Level of anxiety will decrease Outcome: Progressing

## 2024-05-27 NOTE — Progress Notes (Signed)
    1 Day Post-Op  Subjective: Endorsing significant incisional pain. Had significant nausea post-op that has improved somewhat this morning.   ROS: See above, otherwise other systems negative  Objective: Vital signs in last 24 hours: Temp:  [97.4 F (36.3 C)-98.1 F (36.7 C)] 98.1 F (36.7 C) (08/09 0451) Pulse Rate:  [61-82] 75 (08/09 0451) Resp:  [12-28] 16 (08/09 0451) BP: (124-167)/(81-99) 129/81 (08/09 0451) SpO2:  [90 %-100 %] 100 % (08/09 0451) FiO2 (%):  [37 %] 37 % (08/09 0019) Last BM Date :  (PTA)  Intake/Output from previous day: 08/08 0701 - 08/09 0700 In: 1490 [P.O.:240; I.V.:1200; IV Piggyback:50] Out: 610 [Urine:100; Emesis/NG output:100; Drains:260; Blood:150] Intake/Output this shift: Total I/O In: 120 [P.O.:120] Out: 80 [Drains:80]  PE: Gen: female, NAD Abd: soft, non-distended, incision clean and dry with dermabond intact, JP w/ SS output  Lab Results:  Recent Labs    05/26/24 1506  WBC 13.3*  HGB 12.3  HCT 37.5  PLT 304   BMET Recent Labs    05/26/24 1506  CREATININE 1.40*   PT/INR No results for input(s): LABPROT, INR in the last 72 hours. CMP     Component Value Date/Time   NA 137 05/19/2024 1200   K 3.3 (L) 05/19/2024 1200   CL 101 05/19/2024 1200   CO2 26 05/19/2024 1200   GLUCOSE 106 (H) 05/19/2024 1200   BUN 10 05/19/2024 1200   CREATININE 1.40 (H) 05/26/2024 1506   CREATININE 0.89 05/09/2014 1345   CALCIUM 8.6 (L) 05/19/2024 1200   PROT 7.0 12/24/2020 2335   ALBUMIN 3.7 12/24/2020 2335   AST 15 12/24/2020 2335   ALT 11 12/24/2020 2335   ALKPHOS 51 12/24/2020 2335   BILITOT 0.8 12/24/2020 2335   GFRNONAA 47 (L) 05/26/2024 1506   GFRAA >60 09/14/2017 1155   Lipase     Component Value Date/Time   LIPASE 20 06/07/2014 1430    Studies/Results: No results found.  Anti-infectives: Anti-infectives (From admission, onward)    Start     Dose/Rate Route Frequency Ordered Stop   05/26/24 0711  ceFAZolin  (ANCEF )  2-4 GM/100ML-% IVPB       Note to Pharmacy: Junette Massa A: cabinet override      05/26/24 0711 05/26/24 1914       Assessment/Plan 48 y/o F POD 1 from open repair of a right flank hernia  - Will keep PCA this morning and attempt to wean off this afternoon - Continue regular diet, nausea meds - OOB and ambulate today. May need PT/OT tomorrow   LOS: 1 day   [name] [name] Surgery 05/27/2024, 7:25 AM Please see Amion for pager number during day hours 7:00am-4:30pm or 7:00am -11:30am on weekends

## 2024-05-27 NOTE — Plan of Care (Signed)

## 2024-05-28 LAB — CBC
HCT: 35.2 % — ABNORMAL LOW (ref 36.0–46.0)
Hemoglobin: 11 g/dL — ABNORMAL LOW (ref 12.0–15.0)
MCH: 31.1 pg (ref 26.0–34.0)
MCHC: 31.3 g/dL (ref 30.0–36.0)
MCV: 99.4 fL (ref 80.0–100.0)
Platelets: 121 K/uL — ABNORMAL LOW (ref 150–400)
RBC: 3.54 MIL/uL — ABNORMAL LOW (ref 3.87–5.11)
RDW: 14.2 % (ref 11.5–15.5)
WBC: 8.4 K/uL (ref 4.0–10.5)
nRBC: 0 % (ref 0.0–0.2)

## 2024-05-28 MED ORDER — MORPHINE SULFATE (PF) 2 MG/ML IV SOLN
2.0000 mg | INTRAVENOUS | Status: DC | PRN
  Administered 2024-05-28 – 2024-05-30 (×15): 2 mg via INTRAVENOUS
  Filled 2024-05-28 (×10): qty 1

## 2024-05-28 MED ORDER — CAMPHOR-MENTHOL 0.5-0.5 % EX LOTN
TOPICAL_LOTION | CUTANEOUS | Status: DC | PRN
  Filled 2024-05-28: qty 222

## 2024-05-28 MED ORDER — DIPHENHYDRAMINE HCL 25 MG PO CAPS
50.0000 mg | ORAL_CAPSULE | Freq: Four times a day (QID) | ORAL | Status: DC | PRN
  Administered 2024-05-28 (×2): 50 mg via ORAL
  Filled 2024-05-28 (×2): qty 2

## 2024-05-28 NOTE — Plan of Care (Signed)

## 2024-05-28 NOTE — Progress Notes (Signed)
    2 Days Post-Op  Subjective: Pain has improved but she developed significant itching and was unable to sleep d/t PCA beeping. JP serous.   ROS: See above, otherwise other systems negative  Objective: Vital signs in last 24 hours: Temp:  [97.7 F (36.5 C)-98.7 F (37.1 C)] 98.7 F (37.1 C) (08/10 0340) Pulse Rate:  [61-84] 71 (08/10 0340) Resp:  [15-22] 22 (08/10 0345) BP: (107-140)/(69-99) 124/69 (08/10 0340) SpO2:  [96 %-100 %] 96 % (08/10 0340) FiO2 (%):  [40 %] 40 % (08/09 1202) Last BM Date :  (PTA)  Intake/Output from previous day: 08/09 0701 - 08/10 0700 In: 986.3 [P.O.:950; I.V.:36.3] Out: 315 [Drains:315] Intake/Output this shift: No intake/output data recorded.  PE: Gen: female, NAD Abd: soft, non-distended, incision clean and dry with dermabond intact, JP w/ serous output  Lab Results:  Recent Labs    05/26/24 1506 05/27/24 0724  WBC 13.3* 11.0*  HGB 12.3 11.2*  HCT 37.5 34.1*  PLT 304 196   BMET Recent Labs    05/26/24 1506 05/27/24 0724  NA  --  136  K  --  4.1  CL  --  101  CO2  --  21*  GLUCOSE  --  138*  BUN  --  13  CREATININE 1.40* 1.16*  CALCIUM  --  8.1*   PT/INR No results for input(s): LABPROT, INR in the last 72 hours. CMP     Component Value Date/Time   NA 136 05/27/2024 0724   K 4.1 05/27/2024 0724   CL 101 05/27/2024 0724   CO2 21 (L) 05/27/2024 0724   GLUCOSE 138 (H) 05/27/2024 0724   BUN 13 05/27/2024 0724   CREATININE 1.16 (H) 05/27/2024 0724   CREATININE 0.89 05/09/2014 1345   CALCIUM 8.1 (L) 05/27/2024 0724   PROT 7.0 12/24/2020 2335   ALBUMIN 3.7 12/24/2020 2335   AST 15 12/24/2020 2335   ALT 11 12/24/2020 2335   ALKPHOS 51 12/24/2020 2335   BILITOT 0.8 12/24/2020 2335   GFRNONAA 59 (L) 05/27/2024 0724   GFRAA >60 09/14/2017 1155   Lipase     Component Value Date/Time   LIPASE 20 06/07/2014 1430    Studies/Results: No results found.  Anti-infectives: Anti-infectives (From admission, onward)     Start     Dose/Rate Route Frequency Ordered Stop   05/26/24 0711  ceFAZolin  (ANCEF ) 2-4 GM/100ML-% IVPB       Note to Pharmacy: Junette Massa A: cabinet override      05/26/24 0711 05/26/24 1914       Assessment/Plan 48 y/o F POD 2 from open repair of a right flank hernia  - PCA off this AM, added IV morphine  for severe paine - Continue regular diet, nausea meds - OOB and ambulate today. PT/OT consulted   LOS: 2 days   [name] Surgery 05/28/2024, 7:17 AM Please see Amion for pager number during day hours 7:00am-4:30pm or 7:00am -11:30am on weekends

## 2024-05-28 NOTE — Progress Notes (Signed)
 Discontinued patient PCA pump at this time. Wasted 10 ML of dilaudid  with Chasidy,LPN.

## 2024-05-28 NOTE — Plan of Care (Signed)
  Problem: Coping: Goal: Level of anxiety will decrease Outcome: Progressing   Problem: Nutrition: Goal: Adequate nutrition will be maintained Outcome: Not Progressing   Problem: Pain Managment: Goal: General experience of comfort will improve and/or be controlled Outcome: Not Progressing

## 2024-05-28 NOTE — Progress Notes (Signed)
 Patient stated that the Atarax  decreased the itching some, but she was still having some problems with it. Tech assisted her with a warm bath and we applied lotion. She stated that she feels much better now and feels that maybe she the soap she used earlier today may have caused the problem. Appears to be resting at this time.

## 2024-05-28 NOTE — Progress Notes (Signed)
 Patient slept some but then woke up with what she stated was mild itching. Gave benedryl per request.

## 2024-05-28 NOTE — Progress Notes (Signed)
 Notified by patient that there is a lump above her abdominal incision. Site is painful to touch. There was no redness noted to the area at this time. Notified Cordella Merck, MD awaiting his response,

## 2024-05-29 LAB — CBC
HCT: 29.5 % — ABNORMAL LOW (ref 36.0–46.0)
Hemoglobin: 9.6 g/dL — ABNORMAL LOW (ref 12.0–15.0)
MCH: 31.1 pg (ref 26.0–34.0)
MCHC: 32.5 g/dL (ref 30.0–36.0)
MCV: 95.5 fL (ref 80.0–100.0)
Platelets: 246 K/uL (ref 150–400)
RBC: 3.09 MIL/uL — ABNORMAL LOW (ref 3.87–5.11)
RDW: 13.9 % (ref 11.5–15.5)
WBC: 6.3 K/uL (ref 4.0–10.5)
nRBC: 0 % (ref 0.0–0.2)

## 2024-05-29 LAB — BASIC METABOLIC PANEL WITH GFR
Anion gap: 11 (ref 5–15)
BUN: 6 mg/dL (ref 6–20)
CO2: 23 mmol/L (ref 22–32)
Calcium: 8.2 mg/dL — ABNORMAL LOW (ref 8.9–10.3)
Chloride: 102 mmol/L (ref 98–111)
Creatinine, Ser: 1.1 mg/dL — ABNORMAL HIGH (ref 0.44–1.00)
GFR, Estimated: >60 mL/min (ref >60)
Glucose, Bld: 108 mg/dL — ABNORMAL HIGH (ref 70–99)
Potassium: 3.2 mmol/L — ABNORMAL LOW (ref 3.5–5.1)
Sodium: 136 mmol/L (ref 135–145)

## 2024-05-29 NOTE — Evaluation (Signed)
 Physical Therapy Evaluation Patient Details Name: [name] MRN: [id number] DOB: 08/15/1976 Today's Date: 05/29/2024  History of Present Illness  Pt is 48 yo female who presents on 05/26/24 for incisional/ flank hernia repair after nephrolithiasis after R nephrectomy in 03/2023. PMH: HTN, chronic pyelonephritis, chronic UTI, kidney stones  Clinical Impression  Pt admitted with above diagnosis. Pt from home with good family support. Pt presents with R sided abdominal pain but pt very motivated to mobilize and be able to return home. Pt needing min A for bed mobility and family educated on how to best assist pt. Pt ambulated 125' with RW and supervision. RW did help manage pain with mobility and do recommend for home. Will follow acutely but no PT needed at d/c.  Pt currently with functional limitations due to the deficits listed below (see PT Problem List). Pt will benefit from acute skilled PT to increase their independence and safety with mobility to allow discharge.           If plan is discharge home, recommend the following: Assistance with cooking/housework;Assist for transportation   Can travel by private vehicle        Equipment Recommendations Rolling walker (2 wheels)  Recommendations for Other Services       Functional Status Assessment Patient has had a recent decline in their functional status and demonstrates the ability to make significant improvements in function in a reasonable and predictable amount of time.     Precautions / Restrictions Precautions Precautions: Fall Precaution/Restrictions Comments: R JP drain Restrictions Weight Bearing Restrictions Per Provider Order: No      Mobility  Bed Mobility Overal bed mobility: Needs Assistance Bed Mobility: Rolling, Sidelying to Sit Rolling: Supervision Sidelying to sit: Min assist, Used rails, HOB elevated       General bed mobility comments: pt unable to come to sitting independently, taught pt's sister  how to give pt a firm arm and let pt get self up instead of pulling on pt. Pt and family voice understanding    Transfers Overall transfer level: Needs assistance Equipment used: Rolling walker (2 wheels) Transfers: Sit to/from Stand Sit to Stand: Supervision           General transfer comment: vc's for hand placement    Ambulation/Gait Ambulation/Gait assistance: Supervision Gait Distance (Feet): 125 Feet Assistive device: Rolling walker (2 wheels) Gait Pattern/deviations: Step-through pattern, Trunk flexed Gait velocity: decreased Gait velocity interpretation: 1.31 - 2.62 ft/sec, indicative of limited community ambulator   General Gait Details: cautious, guarded gait with trunk flexed. Educated on erect posture which pt cannot achieve at this time due to pain but pt voices understanding that this is the eventual goal  Stairs            Wheelchair Mobility     Tilt Bed    Modified Rankin (Stroke Patients Only)       Balance Overall balance assessment: No apparent balance deficits (not formally assessed)                                           Pertinent Vitals/Pain Pain Assessment Pain Assessment: Faces Faces Pain Scale: Hurts even more Pain Location: R abdomen Pain Descriptors / Indicators: Operative site guarding, Sore, Grimacing Pain Intervention(s): Limited activity within patient's tolerance, Monitored during session    Home Living Family/patient expects to be discharged to:: Private residence Living Arrangements:  Spouse/significant other Available Help at Discharge: Family;Available 24 hours/day Type of Home: House Home Access: Level entry       Home Layout: One level Home Equipment: None Additional Comments: good family support    Prior Function Prior Level of Function : Independent/Modified Independent             Mobility Comments: indep ADLs Comments: indep     Extremity/Trunk Assessment   Upper Extremity  Assessment Upper Extremity Assessment: Defer to OT evaluation    Lower Extremity Assessment Lower Extremity Assessment: Overall WFL for tasks assessed    Cervical / Trunk Assessment Cervical / Trunk Assessment: Other exceptions Cervical / Trunk Exceptions: abd sx  Communication   Communication Communication: No apparent difficulties    Cognition Arousal: Alert Behavior During Therapy: WFL for tasks assessed/performed   PT - Cognitive impairments: No apparent impairments                         Following commands: Intact       Cueing Cueing Techniques: Verbal cues     General Comments General comments (skin integrity, edema, etc.): VSS    Exercises     Assessment/Plan    PT Assessment Patient needs continued PT services  PT Problem List Decreased activity tolerance;Decreased mobility;Pain;Obesity       PT Treatment Interventions DME instruction;Gait training;Functional mobility training;Therapeutic activities;Therapeutic exercise;Patient/family education    PT Goals (Current goals can be found in the Care Plan section)  Acute Rehab PT Goals Patient Stated Goal: return home PT Goal Formulation: With patient Time For Goal Achievement: 06/12/24 Potential to Achieve Goals: Good    Frequency Min 2X/week     Co-evaluation               AM-PAC PT 6 Clicks Mobility  Outcome Measure Help needed turning from your back to your side while in a flat bed without using bedrails?: None Help needed moving from lying on your back to sitting on the side of a flat bed without using bedrails?: A Little Help needed moving to and from a bed to a chair (including a wheelchair)?: A Little Help needed standing up from a chair using your arms (e.g., wheelchair or bedside chair)?: A Little Help needed to walk in hospital room?: A Little Help needed climbing 3-5 steps with a railing? : A Little 6 Click Score: 19    End of Session   Activity Tolerance: Patient  tolerated treatment well Patient left: in chair;with call bell/phone within reach;with family/visitor present Nurse Communication: Mobility status PT Visit Diagnosis: Difficulty in walking, not elsewhere classified (R26.2);Pain Pain - Right/Left: Right Pain - part of body:  (abdomen)    Time: 8998-8975 PT Time Calculation (min) (ACUTE ONLY): 23 min   Charges:   PT Evaluation $PT Eval Moderate Complexity: 1 Mod PT Treatments $Gait Training: 8-22 mins PT General Charges $$ ACUTE PT VISIT: 1 Visit         Richerd Lipoma, PT  Acute Rehab Services Secure chat preferred Office [phone]   Richerd CROME Wess Baney 05/29/2024, 1:42 PM

## 2024-05-29 NOTE — Progress Notes (Signed)
    3 Days Post-Op  Subjective: The itching resolved with the lotion but she reports that her pain is the worst this morning. She has been able to eat more. She has only ambulated to the restroom.   ROS: See above, otherwise other systems negative  Objective: Vital signs in last 24 hours: Temp:  [98 F (36.7 C)-99.4 F (37.4 C)] 99.4 F (37.4 C) (08/11 0439) Pulse Rate:  [91-108] 108 (08/11 0439) Resp:  [17-20] 17 (08/10 1603) BP: (119-145)/(57-92) 129/57 (08/11 0439) SpO2:  [94 %-99 %] 98 % (08/11 0439) Last BM Date :  (PTA)  Intake/Output from previous day: 08/10 0701 - 08/11 0700 In: 240 [P.O.:240] Out: 300 [Drains:300] Intake/Output this shift: No intake/output data recorded.  PE: Gen: female, NAD Abd: soft, non-distended, incision clean and dry with dermabond intact, JP w/ serous output  Lab Results:  Recent Labs    05/27/24 0724 05/28/24 1237  WBC 11.0* 8.4  HGB 11.2* 11.0*  HCT 34.1* 35.2*  PLT 196 121*   BMET Recent Labs    05/26/24 1506 05/27/24 0724  NA  --  136  K  --  4.1  CL  --  101  CO2  --  21*  GLUCOSE  --  138*  BUN  --  13  CREATININE 1.40* 1.16*  CALCIUM  --  8.1*   PT/INR No results for input(s): LABPROT, INR in the last 72 hours. CMP     Component Value Date/Time   NA 136 05/27/2024 0724   K 4.1 05/27/2024 0724   CL 101 05/27/2024 0724   CO2 21 (L) 05/27/2024 0724   GLUCOSE 138 (H) 05/27/2024 0724   BUN 13 05/27/2024 0724   CREATININE 1.16 (H) 05/27/2024 0724   CREATININE 0.89 05/09/2014 1345   CALCIUM 8.1 (L) 05/27/2024 0724   PROT 7.0 12/24/2020 2335   ALBUMIN 3.7 12/24/2020 2335   AST 15 12/24/2020 2335   ALT 11 12/24/2020 2335   ALKPHOS 51 12/24/2020 2335   BILITOT 0.8 12/24/2020 2335   GFRNONAA 59 (L) 05/27/2024 0724   GFRAA >60 09/14/2017 1155   Lipase     Component Value Date/Time   LIPASE 20 06/07/2014 1430    Studies/Results: No results found.  Anti-infectives: Anti-infectives (From admission,  onward)    Start     Dose/Rate Route Frequency Ordered Stop   05/26/24 0711  ceFAZolin  (ANCEF ) 2-4 GM/100ML-% IVPB       Note to Pharmacy: Junette Massa A: cabinet override      05/26/24 0711 05/26/24 1914       Assessment/Plan 48 y/o F POD 3 from open repair of a right flank hernia  - Continue current pain regimen - Continue regular diet, nausea meds - OOB and ambulate today. PT/OT consulted   LOS: 3 days   [name] Surgery 05/29/2024, 7:36 AM Please see Amion for pager number during day hours 7:00am-4:30pm or 7:00am -11:30am on weekends

## 2024-05-29 NOTE — TOC Initial Note (Signed)
 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 No current TOC needs please consult as needs arise following therapy eval.         Patient Goals and CMS Choice            Expected Discharge Plan and Services                                              Prior Living Arrangements/Services                       Activities of Daily Living   ADL Screening (condition at time of admission) Independently performs ADLs?: Yes (appropriate for developmental age) Is the patient deaf or have difficulty hearing?: No Does the patient have difficulty seeing, even when wearing glasses/contacts?: No Does the patient have difficulty concentrating, remembering, or making decisions?: No  Permission Sought/Granted                  Emotional Assessment              Admission diagnosis:  Incisional hernia, without obstruction or gangrene [K43.2] Incisional hernia [K43.2] Patient Active Problem List   Diagnosis Date Noted   Incisional hernia 05/26/2024   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/2023   Vomiting 04/21/2012   Pyelonephritis 04/20/2012   HTN (hypertension) 04/20/2012   Asthma 04/20/2012   PCP:  Juliene Asberry NOVAK, DO Pharmacy:   [facility] [phone] - [place], [place] - [address] AT [facility] [address] [place] [address] Phone: [phone] Fax: [phone]  [facility] [place] [facility] [address] [place] [address] Phone: [phone] Fax: [phone]     Social Drivers of Health (SDOH) Social History: SDOH Screenings   Food Insecurity: No Food Insecurity  (05/26/2024)  Housing: Low Risk  (05/27/2024)  Transportation Needs: No Transportation Needs (05/27/2024)  Utilities: Not At Risk (05/27/2024)  Tobacco Use: Medium Risk (05/26/2024)   SDOH Interventions:     Readmission Risk Interventions     No data to display

## 2024-05-29 NOTE — TOC Progression Note (Signed)
 Transition of Care [facility]) - Progression Note    Patient Details  Name: SHATINA STREETS MRN: [id number] Date of Birth: 1976-04-17  Transition of Care [facility]) CM/SW Contact  Nola Devere Hands, RN Phone Number: 05/29/2024, 2:47 PM  Clinical Narrative:    Patient is a 48 yr old female s/p Incisional hernia repair. She is from home with spouse. No Home Health needs, RW has been requested from [employer], to be delivered to patient's room. No further needs Identified.   Expected Discharge Plan: Home/Self Care Barriers to Discharge: No Barriers Identified               Expected Discharge Plan and Services In-house Referral: NA Discharge Planning Services: CM Consult Post Acute Care Choice: Durable Medical Equipment Living arrangements for the past 2 months: Apartment                 DME Arranged: Walker rolling DME Agency: [employer] Date DME Agency Contacted: 05/29/24 Time DME Agency Contacted: [phone] Representative spoke with at DME Agency: Mitch HH Arranged: NA HH Agency: NA         Social Drivers of Health (SDOH) Interventions SDOH Screenings   Food Insecurity: No Food Insecurity (05/26/2024)  Housing: Low Risk  (05/27/2024)  Transportation Needs: No Transportation Needs (05/27/2024)  Utilities: Not At Risk (05/27/2024)  Tobacco Use: Medium Risk (05/26/2024)    Readmission Risk Interventions     No data to display

## 2024-05-29 NOTE — Progress Notes (Signed)
 Walker delivered to pt room for discharge

## 2024-05-29 NOTE — Plan of Care (Signed)
   Problem: Health Behavior/Discharge Planning: Goal: Ability to manage health-related needs will improve Outcome: Progressing   Problem: Nutrition: Goal: Adequate nutrition will be maintained Outcome: Progressing   Problem: Coping: Goal: Level of anxiety will decrease Outcome: Progressing   Problem: Pain Managment: Goal: General experience of comfort will improve and/or be controlled Outcome: Progressing

## 2024-05-29 NOTE — Evaluation (Addendum)
 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 PMH: HTN, chronic pyelonephritis, chronic UTI, kidney stones     Clinical Impressions Maelys was evaluated s/p the above admission list. She is typically indep at baseline and has great family support. Upon evaluation, pt demonstrated mod I ability to complete mobility and ADLs. Generalized superivsion A provided throughout for safety, pt reported improved tolerance for functional mobility this session when compared to her first walk this morning. Pt does not require further acute, or follow up OT services. Recommend discharge back to pt's environment with assist as needed. OT to sign off with appreciation of order, please re-consult if needed.   Pt did report a pulling sensation at her incision with mobility; therapist inquired about abdominal binder use via secure chat to [place], MD. Awaiting response.       If plan is discharge home, recommend the following:   Assist for transportation;Assistance with cooking/housework     Functional Status Assessment   Patient has had a recent decline in their functional status and demonstrates the ability to make significant improvements in function in a reasonable and predictable amount of time.     Equipment Recommendations   None recommended by OT      Precautions/Restrictions   Precautions Precautions: Fall Precaution/Restrictions Comments: R JP drain Restrictions Weight Bearing Restrictions Per Provider Order: No     Mobility Bed Mobility Overal bed mobility: Needs Assistance Bed Mobility: Rolling, Sidelying to Sit Rolling: Supervision Sidelying to sit: Contact guard assist       General bed mobility comments: use of bed rails,  family offering assist.    Transfers Overall transfer level: Needs assistance Equipment used: Rolling walker (2 wheels) Transfers: Sit to/from Stand Sit to Stand: Supervision                  Balance Overall balance assessment: No apparent balance deficits (not formally assessed)                 ADL either performed or assessed with clinical judgement   ADL Overall ADL's : Modified independent               General ADL Comments: generalized supervision A provided for safety, pt demonstrated mod I with RW     Vision Baseline Vision/History: 0 No visual deficits Vision Assessment?: No apparent visual deficits     Perception Perception: Within Functional Limits       Praxis Praxis: WFL       Pertinent Vitals/Pain Pain Assessment Pain Assessment: 0-10 Pain Score: 4  Pain Location: abdomen Pain Intervention(s): Limited activity within patient's tolerance, Monitored during session     Extremity/Trunk Assessment Upper Extremity Assessment Upper Extremity Assessment: Overall WFL for tasks assessed   Lower Extremity Assessment Lower Extremity Assessment: Defer to PT evaluation   Cervical / Trunk Assessment Cervical / Trunk Assessment: Other exceptions Cervical / Trunk Exceptions: abd sx   Communication Communication Communication: No apparent difficulties   Cognition Arousal: Alert Behavior During Therapy: WFL for tasks assessed/performed Cognition: No apparent impairments           Following commands: Intact       Cueing  General Comments   Cueing Techniques: Verbal cues  VSS, pt stated she has a pulling sensation at the  incision when she is up. (therapist messaged MD about use of abd binder)    Home Living Family/patient expects to be discharged to:: Private residence Living Arrangements: Spouse/significant other Available Help at Discharge: Family;Available 24 hours/day Type of Home: House                                   Prior Functioning/Environment Prior Level of Function : Independent/Modified Independent             Mobility Comments: indep ADLs Comments: indep    OT Problem List: Decreased strength;Decreased range of motion;Decreased activity tolerance;Impaired balance (sitting and/or standing);Pain        OT Goals(Current goals can be found in the care plan section)   Acute Rehab OT Goals Patient Stated Goal: less pain OT Goal Formulation: With patient Time For Goal Achievement: 06/12/24 Potential to Achieve Goals: Good   AM-PAC OT 6 Clicks Daily Activity     Outcome Measure Help from another person eating meals?: None Help from another person taking care of personal grooming?: None Help from another person toileting, which includes using toliet, bedpan, or urinal?: None Help from another person bathing (including washing, rinsing, drying)?: None Help from another person to put on and taking off regular upper body clothing?: None Help from another person to put on and taking off regular lower body clothing?: None 6 Click Score: 24   End of Session Equipment Utilized During Treatment: Rolling walker (2 wheels) Nurse Communication: Mobility status  Activity Tolerance: Patient tolerated treatment well Patient left: in bed;with call bell/phone within reach;with family/visitor present  OT Visit Diagnosis: Unsteadiness on feet (R26.81);Other abnormalities of gait and mobility (R26.89);Muscle weakness (generalized) (M62.81);Pain                Time: 1150-1206 OT Time Calculation (min): 16 min Charges:  OT General Charges $OT Visit: 1 Visit OT Evaluation $OT Eval Low Complexity: 1 Low  Lucie Kendall, OTR/L Acute Rehabilitation Services Office [phone] Secure Chat Communication Preferred   Lucie JONETTA Kendall 05/29/2024, 12:12 PM

## 2024-05-30 ENCOUNTER — Inpatient Hospital Stay (HOSPITAL_COMMUNITY)

## 2024-05-30 MED ORDER — MORPHINE SULFATE (PF) 2 MG/ML IV SOLN
2.0000 mg | Freq: Four times a day (QID) | INTRAVENOUS | Status: DC | PRN
  Administered 2024-05-30 (×2): 2 mg via INTRAVENOUS
  Filled 2024-05-30: qty 1

## 2024-05-30 MED ORDER — PROMETHAZINE HCL 12.5 MG PO TABS
12.5000 mg | ORAL_TABLET | Freq: Four times a day (QID) | ORAL | Status: DC | PRN
  Administered 2024-05-30 (×2): 12.5 mg via ORAL
  Filled 2024-05-30 (×2): qty 1

## 2024-05-30 MED ORDER — PROMETHAZINE HCL 12.5 MG RE SUPP: 12.5000 mg | Freq: Four times a day (QID) | RECTAL | Status: DC | PRN

## 2024-05-30 MED ORDER — SODIUM CHLORIDE 0.9 % IV SOLN: 12.5000 mg | Freq: Four times a day (QID) | INTRAVENOUS | Status: DC | PRN

## 2024-05-30 MED ORDER — POLYETHYLENE GLYCOL 3350 17 G PO PACK
17.0000 g | PACK | Freq: Two times a day (BID) | ORAL | Status: DC
  Administered 2024-05-30 – 2024-06-01 (×7): 17 g via ORAL
  Filled 2024-05-30 (×5): qty 1

## 2024-05-30 NOTE — Progress Notes (Signed)
 Order for new IV placed

## 2024-05-30 NOTE — Plan of Care (Signed)

## 2024-05-30 NOTE — Progress Notes (Addendum)
    4 Days Post-Op  Subjective: Continues to endorse incisional pain. Worked with PT/OT yesterday and is using a walker for assistance.   She reports emesis x 1 with persistent nausea and decreased PO. No BM since surgery.   ROS: See above, otherwise other systems negative  Objective: Vital signs in last 24 hours: Temp:  [98.1 F (36.7 C)-100.7 F (38.2 C)] 98.5 F (36.9 C) (08/12 0852) Pulse Rate:  [83-100] 83 (08/12 0852) Resp:  [17-18] 17 (08/12 0852) BP: (112-133)/(55-80) 133/80 (08/12 0852) SpO2:  [97 %-100 %] 100 % (08/12 0852) Last BM Date :  (pta)  Intake/Output from previous day: 08/11 0701 - 08/12 0700 In: 960 [P.O.:960] Out: 200 [Drains:200] Intake/Output this shift: No intake/output data recorded.  PE: Gen: female, NAD Abd: soft, non-distended, incision clean and dry with dermabond intact, JP w/ serous output  Lab Results:  Recent Labs    05/28/24 1237 05/29/24 0755  WBC 8.4 6.3  HGB 11.0* 9.6*  HCT 35.2* 29.5*  PLT 121* 246   BMET Recent Labs    05/29/24 0755  NA 136  K 3.2*  CL 102  CO2 23  GLUCOSE 108*  BUN 6  CREATININE 1.10*  CALCIUM 8.2*   PT/INR No results for input(s): LABPROT, INR in the last 72 hours. CMP     Component Value Date/Time   NA 136 05/29/2024 0755   K 3.2 (L) 05/29/2024 0755   CL 102 05/29/2024 0755   CO2 23 05/29/2024 0755   GLUCOSE 108 (H) 05/29/2024 0755   BUN 6 05/29/2024 0755   CREATININE 1.10 (H) 05/29/2024 0755   CREATININE 0.89 05/09/2014 1345   CALCIUM 8.2 (L) 05/29/2024 0755   PROT 7.0 12/24/2020 2335   ALBUMIN 3.7 12/24/2020 2335   AST 15 12/24/2020 2335   ALT 11 12/24/2020 2335   ALKPHOS 51 12/24/2020 2335   BILITOT 0.8 12/24/2020 2335   GFRNONAA >60 05/29/2024 0755   GFRAA >60 09/14/2017 1155   Lipase     Component Value Date/Time   LIPASE 20 06/07/2014 1430    Studies/Results: No results found.  Anti-infectives: Anti-infectives (From admission, onward)    Start     Dose/Rate  Route Frequency Ordered Stop   05/26/24 0711  ceFAZolin  (ANCEF ) 2-4 GM/100ML-% IVPB       Note to Pharmacy: Junette Massa A: cabinet override      05/26/24 0711 05/26/24 1914       Assessment/Plan 48 y/o F POD 4 from open repair of a right flank hernia  - Will space out IV pain meds to q6, encourage PO - Continue regular diet, nausea meds - OOB and ambulate today.  - AXR - Increase Mirlax to BID  AKI (resolved) - Cr 1.4 POD 1 but has since normalized.    LOS: 4 days   [name] Surgery 05/30/2024, 9:15 AM Please see Amion for pager number during day hours 7:00am-4:30pm or 7:00am -11:30am on weekends

## 2024-05-30 NOTE — Progress Notes (Signed)
 Mobility Specialist Progress Note:    05/30/24 1009  Mobility  Activity Ambulated with assistance (In hallway)  Level of Assistance Standby assist, set-up cues, supervision of patient - no hands on  Assistive Device Front wheel walker  Distance Ambulated (ft) 125 ft  Activity Response Tolerated well  Mobility Referral Yes  Mobility visit 1 Mobility  Mobility Specialist Start Time (ACUTE ONLY) [id number]  Mobility Specialist Stop Time (ACUTE ONLY) 1003  Mobility Specialist Time Calculation (min) (ACUTE ONLY) 10 min   Received pt in bed and agreeable to mobility. No physical assistance needed. C/o nausea, otherwise tolerated well. Returned to room without fault. Pt left in bed. Personal belongings and call light within reach.   Lavanda Pollack Mobility Specialist  Please contact via [employer] or  Rehab Office [phone]

## 2024-05-31 LAB — BASIC METABOLIC PANEL WITH GFR
Anion gap: 11 (ref 5–15)
BUN: 5 mg/dL — ABNORMAL LOW (ref 6–20)
CO2: 24 mmol/L (ref 22–32)
Calcium: 8.2 mg/dL — ABNORMAL LOW (ref 8.9–10.3)
Chloride: 103 mmol/L (ref 98–111)
Creatinine, Ser: 1.05 mg/dL — ABNORMAL HIGH (ref 0.44–1.00)
GFR, Estimated: >60 mL/min (ref >60)
Glucose, Bld: 111 mg/dL — ABNORMAL HIGH (ref 70–99)
Potassium: 2.9 mmol/L — ABNORMAL LOW (ref 3.5–5.1)
Sodium: 138 mmol/L (ref 135–145)

## 2024-05-31 LAB — CBC
HCT: 29.2 % — ABNORMAL LOW (ref 36.0–46.0)
Hemoglobin: 9.3 g/dL — ABNORMAL LOW (ref 12.0–15.0)
MCH: 30.3 pg (ref 26.0–34.0)
MCHC: 31.8 g/dL (ref 30.0–36.0)
MCV: 95.1 fL (ref 80.0–100.0)
Platelets: 302 K/uL (ref 150–400)
RBC: 3.07 MIL/uL — ABNORMAL LOW (ref 3.87–5.11)
RDW: 13.8 % (ref 11.5–15.5)
WBC: 6.4 K/uL (ref 4.0–10.5)
nRBC: 0 % (ref 0.0–0.2)

## 2024-05-31 NOTE — Progress Notes (Signed)
 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 PMH: HTN, chronic pyelonephritis, chronic UTI, kidney stones    PT Comments  Pt resting in bed on arrival, pleasant and agreeable to session. Pt demonstrating good progress towards acute goals, progressing activity tolerance tolerance with pt demonstrating ambulation >200' with RW for support and grossly supervision for safety. Pt endorsing increased comfort with abdominal binder donned throughout activity with pt and pt spouse verbalizing understanding of how to donn and optimal positioning. Pt was educated on continued walker use to maximize functional independence, safety, and decrease risk for falls.  Pt continues to benefit from skilled PT services to progress toward functional mobility goals.     If plan is discharge home, recommend the following: Assistance with cooking/housework;Assist for transportation   Can travel by private vehicle        Equipment Recommendations  Rolling walker (2 wheels)    Recommendations for Other Services       Precautions / Restrictions Precautions Precautions: Fall Precaution/Restrictions Comments: R JP drain Restrictions Weight Bearing Restrictions Per Provider Order: No     Mobility  Bed Mobility Overal bed mobility: Needs Assistance Bed Mobility: Supine to Sit     Supine to sit: Supervision, HOB elevated, Used rails     General bed mobility comments: pt with HOB significantly elevated on arrival, able to come to sitting on R EOB without assist    Transfers Overall transfer level: Needs assistance Equipment used: Rolling walker (2 wheels) Transfers: Sit to/from Stand Sit to Stand: Supervision           General transfer comment: good recall for hand  placement    Ambulation/Gait Ambulation/Gait assistance: Supervision Gait Distance (Feet): 225 Feet Assistive device: Rolling walker (2 wheels) Gait Pattern/deviations: Step-through pattern, Trunk flexed Gait velocity: decreased     General Gait Details: improved posture with abdominal binder donned, cues for safety with RW, keeping RW in contact with floor, no LOB   Stairs             Wheelchair Mobility     Tilt Bed    Modified Rankin (Stroke Patients Only)       Balance Overall balance assessment: No apparent balance deficits (not formally assessed)                                          Communication Communication Communication: No apparent difficulties  Cognition Arousal: Alert Behavior During Therapy: WFL for tasks assessed/performed   PT - Cognitive impairments: No apparent impairments                         Following commands: Intact      Cueing Cueing Techniques: Verbal cues  Exercises      General Comments General comments (skin integrity, edema, etc.): VSS      Pertinent Vitals/Pain Pain Assessment Pain Assessment: Faces Faces Pain Scale: Hurts a little bit Pain Location: R abdomen Pain Descriptors / Indicators: Operative site guarding, Sore, Grimacing Pain Intervention(s): Premedicated before session, Monitored during session, Limited activity within patient's tolerance    Home Living  Prior Function            PT Goals (current goals can now be found in the care plan section) Acute Rehab PT Goals Patient Stated Goal: return home PT Goal Formulation: With patient Time For Goal Achievement: 06/12/24 Progress towards PT goals: Progressing toward goals    Frequency    Min 2X/week      PT Plan      Co-evaluation              AM-PAC PT 6 Clicks Mobility   Outcome Measure  Help needed turning from your back to your side while in a flat bed without  using bedrails?: None Help needed moving from lying on your back to sitting on the side of a flat bed without using bedrails?: A Little Help needed moving to and from a bed to a chair (including a wheelchair)?: A Little Help needed standing up from a chair using your arms (e.g., wheelchair or bedside chair)?: A Little Help needed to walk in hospital room?: A Little Help needed climbing 3-5 steps with a railing? : A Little 6 Click Score: 19    End of Session Equipment Utilized During Treatment: Other (comment) (abdominal binder) Activity Tolerance: Patient tolerated treatment well Patient left: with call bell/phone within reach;with family/visitor present;in bed Nurse Communication: Mobility status PT Visit Diagnosis: Difficulty in walking, not elsewhere classified (R26.2);Pain Pain - Right/Left: Right Pain - part of body:  (abdomen)     Time: 8854-8843 PT Time Calculation (min) (ACUTE ONLY): 11 min  Charges:    $Gait Training: 8-22 mins PT General Charges $$ ACUTE PT VISIT: 1 Visit                     Olivette Beckmann R. PTA Acute Rehabilitation Services Office: [phone]   Therisa CHRISTELLA Boor 05/31/2024, 1:37 PM

## 2024-05-31 NOTE — Progress Notes (Signed)
 5 Days Post-Op  Subjective: Appears much more comfortable this morning. She has already been out of bed and ambulating in the hall. No BM yet. Denies current nausea.   ROS: See above, otherwise other systems negative  Objective: Vital signs in last 24 hours: Temp:  [97.4 F (36.3 C)-98.6 F (37 C)] 98.6 F (37 C) (08/13 0738) Pulse Rate:  [96-105] 96 (08/13 0738) Resp:  [16-20] 16 (08/13 0738) BP: (140-175)/(85-95) 175/90 (08/13 0738) SpO2:  [95 %-100 %] 98 % (08/13 0738) Last BM Date :  (pta)  Intake/Output from previous day: 08/12 0701 - 08/13 0700 In: 930 [P.O.:930] Out: 170 [Drains:170] Intake/Output this shift: No intake/output data recorded.  PE: Gen: female, NAD Abd: soft, non-distended, incision clean and dry with dermabond intact, JP w/ serous output  Lab Results:  Recent Labs    05/29/24 0755 05/31/24 0233  WBC 6.3 6.4  HGB 9.6* 9.3*  HCT 29.5* 29.2*  PLT 246 302   BMET Recent Labs    05/29/24 0755 05/31/24 0233  NA 136 138  K 3.2* 2.9*  CL 102 103  CO2 23 24  GLUCOSE 108* 111*  BUN 6 5*  CREATININE 1.10* 1.05*  CALCIUM 8.2* 8.2*   PT/INR No results for input(s): LABPROT, INR in the last 72 hours. CMP     Component Value Date/Time   NA 138 05/31/2024 0233   K 2.9 (L) 05/31/2024 0233   CL 103 05/31/2024 0233   CO2 24 05/31/2024 0233   GLUCOSE 111 (H) 05/31/2024 0233   BUN 5 (L) 05/31/2024 0233   CREATININE 1.05 (H) 05/31/2024 0233   CREATININE 0.89 05/09/2014 1345   CALCIUM 8.2 (L) 05/31/2024 0233   PROT 7.0 12/24/2020 2335   ALBUMIN 3.7 12/24/2020 2335   AST 15 12/24/2020 2335   ALT 11 12/24/2020 2335   ALKPHOS 51 12/24/2020 2335   BILITOT 0.8 12/24/2020 2335   GFRNONAA >60 05/31/2024 0233   GFRAA >60 09/14/2017 1155   Lipase     Component Value Date/Time   LIPASE 20 06/07/2014 1430    Studies/Results: DG Abd 1 View Result Date: 05/30/2024 CLINICAL DATA:  Nausea and vomiting.  Post open hernia repair. EXAM:  ABDOMEN - 1 VIEW COMPARISON:  None Available. FINDINGS: No bowel dilatation or evidence of obstruction. Moderate formed stool in the right colon, with otherwise small volume of stool in the colon. Right lower quadrant drain is partially visualized in the lateral field of view. Postsurgical change of the soft tissues. No visible radiopaque calculi. Left pelvic phlebolith. IMPRESSION: Normal bowel gas pattern.  Small to moderate colonic stool burden. Electronically Signed   By: Andrea Gasman M.D.   On: 05/30/2024 14:36    Anti-infectives: Anti-infectives (From admission, onward)    Start     Dose/Rate Route Frequency Ordered Stop   05/26/24 0711  ceFAZolin  (ANCEF ) 2-4 GM/100ML-% IVPB       Note to Pharmacy: Junette Massa A: cabinet override      05/26/24 0711 05/26/24 1914       Assessment/Plan 48 y/o F POD 5 from open repair of a right flank hernia  - DC IV pain meds - Continue regular diet, nausea meds - OOB and ambulate today.  - Miralax  BID. She is not interested in a suppository at this time  AKI (resolved) - Cr 1.4 POD 1 but has since normalized.    LOS: 5 days   [name] Surgery 05/31/2024, 9:51 AM Please see  Amion for pager number during day hours 7:00am-4:30pm or 7:00am -11:30am on weekends

## 2024-05-31 NOTE — Progress Notes (Signed)
 Mobility Specialist Progress Note:   05/31/24 1517  Mobility  Activity Ambulated with assistance (In hallway)  Level of Assistance Standby assist, set-up cues, supervision of patient - no hands on  Assistive Device Front wheel walker  Distance Ambulated (ft) 550 ft  Activity Response Tolerated well  Mobility Referral Yes  Mobility visit 1 Mobility  Mobility Specialist Start Time (ACUTE ONLY) 1446  Mobility Specialist Stop Time (ACUTE ONLY) 1500  Mobility Specialist Time Calculation (min) (ACUTE ONLY) 14 min   Received pt in bed and agreeable to mobility. No physical assistance needed. No c/o. Returned to room and pt requested to use the BR. Instructed pt to call out once finished. All needs met. Husband in room.  Lavanda Pollack Mobility Specialist  Please contact via [employer] or  Rehab Office [phone]

## 2024-06-01 LAB — BASIC METABOLIC PANEL WITH GFR
Anion gap: 11 (ref 5–15)
BUN: <5 mg/dL — ABNORMAL LOW (ref 6–20)
CO2: 24 mmol/L (ref 22–32)
Calcium: 8.3 mg/dL — ABNORMAL LOW (ref 8.9–10.3)
Chloride: 103 mmol/L (ref 98–111)
Creatinine, Ser: 1.05 mg/dL — ABNORMAL HIGH (ref 0.44–1.00)
GFR, Estimated: >60 mL/min (ref >60)
Glucose, Bld: 141 mg/dL — ABNORMAL HIGH (ref 70–99)
Potassium: 3.2 mmol/L — ABNORMAL LOW (ref 3.5–5.1)
Sodium: 138 mmol/L (ref 135–145)

## 2024-06-01 LAB — GLUCOSE, CAPILLARY: Glucose-Capillary: 123 mg/dL — ABNORMAL HIGH (ref 70–99)

## 2024-06-01 MED ORDER — POTASSIUM CHLORIDE CRYS ER 20 MEQ PO TBCR: 80.0000 meq | EXTENDED_RELEASE_TABLET | Freq: Once | ORAL | Status: DC

## 2024-06-01 MED ORDER — OXYCODONE HCL 5 MG PO TABS: 5.0000 mg | ORAL_TABLET | ORAL | 0 refills | Status: AC | PRN

## 2024-06-01 MED ORDER — POTASSIUM CHLORIDE CRYS ER 20 MEQ PO TBCR
80.0000 meq | EXTENDED_RELEASE_TABLET | Freq: Once | ORAL | Status: AC
Start: 2024-06-01 — End: 2024-06-01
  Administered 2024-06-01: 80 meq via ORAL
  Filled 2024-06-01: qty 4

## 2024-06-01 MED ORDER — MAGNESIUM SULFATE 4 GM/100ML IV SOLN
4.0000 g | Freq: Once | INTRAVENOUS | Status: DC
  Filled 2024-06-01: qty 100

## 2024-06-01 MED ORDER — POTASSIUM CHLORIDE CRYS ER 20 MEQ PO TBCR
40.0000 meq | EXTENDED_RELEASE_TABLET | ORAL | Status: AC
  Administered 2024-06-01 (×2): 40 meq via ORAL
  Filled 2024-06-01 (×2): qty 2

## 2024-06-01 MED ORDER — ACETAMINOPHEN 325 MG PO TABS: 650.0000 mg | ORAL_TABLET | Freq: Four times a day (QID) | ORAL | 0 refills | Status: AC

## 2024-06-01 MED ORDER — MAGNESIUM CHLORIDE 64 MG PO TBEC
1.0000 | DELAYED_RELEASE_TABLET | Freq: Once | ORAL | Status: AC
  Administered 2024-06-01: 64 mg via ORAL
  Filled 2024-06-01: qty 1

## 2024-06-01 MED ORDER — METHOCARBAMOL 500 MG PO TABS: 500.0000 mg | ORAL_TABLET | Freq: Three times a day (TID) | ORAL | 0 refills | Status: AC | PRN

## 2024-06-01 MED ORDER — POTASSIUM CHLORIDE 10 MEQ/100ML IV SOLN
10.0000 meq | INTRAVENOUS | Status: AC
  Filled 2024-06-01: qty 100

## 2024-06-01 MED ORDER — POLYETHYLENE GLYCOL 3350 17 G PO PACK: 17.0000 g | PACK | Freq: Two times a day (BID) | ORAL | 0 refills | Status: AC

## 2024-06-01 MED ORDER — ONDANSETRON HCL 4 MG PO TABS: 4.0000 mg | ORAL_TABLET | Freq: Every day | ORAL | 1 refills | Status: DC | PRN

## 2024-06-01 NOTE — Progress Notes (Signed)
 Loss IV access, IV team was consulted but failed. MD was notified and changed the IV meds (K, Mg) to PO.  06/01/24 0919  Unsuccessful Nursing Procedure/Treatment  Type of Nursing Procedure/Treatment Peripheral IV insertion  Number of attempts 1  Location of attempt R AC  [REMOVED] Peripheral IV 05/30/24 22 G 1.75 Anterior;Left Forearm  Removal Date/Time: 06/01/24 1046  Placement Date/Time: 05/30/24 1025   Ultrasound Used?: Yes, depth 5mm or less  Size (Gauge): 22 G  Size (Length): 1.75  Orientation: Anterior;Left  Location: Forearm  Site Prep: Chlorhexidine  (preferred);Skin Prep Co...  Site Assessment Phlebitis

## 2024-06-01 NOTE — Progress Notes (Signed)
    6 Days Post-Op  Subjective: Her pain continues to improve. Had 2 BM yesterday and has had less nausea. K yesterday was 2.9 but she denies symptoms. Overall, doing better.  ROS: See above, otherwise other systems negative  Objective: Vital signs in last 24 hours: Temp:  [97.6 F (36.4 C)-98.7 F (37.1 C)] 98.5 F (36.9 C) (08/14 0541) Pulse Rate:  [84-99] 99 (08/14 0541) Resp:  [16-17] 17 (08/14 0541) BP: (150-163)/(86-94) 150/94 (08/14 0541) SpO2:  [98 %-100 %] 100 % (08/14 0541) Last BM Date : 05/31/24  Intake/Output from previous day: 08/13 0701 - 08/14 0700 In: 240 [P.O.:240] Out: 80 [Drains:80] Intake/Output this shift: No intake/output data recorded.  PE: Gen: female, NAD Abd: soft, non-distended, incision clean and dry with dermabond intact, JP w/ serous output  Lab Results:  Recent Labs    05/31/24 0233  WBC 6.4  HGB 9.3*  HCT 29.2*  PLT 302   BMET Recent Labs    05/31/24 0233  NA 138  K 2.9*  CL 103  CO2 24  GLUCOSE 111*  BUN 5*  CREATININE 1.05*  CALCIUM 8.2*   PT/INR No results for input(s): LABPROT, INR in the last 72 hours. CMP     Component Value Date/Time   NA 138 05/31/2024 0233   K 2.9 (L) 05/31/2024 0233   CL 103 05/31/2024 0233   CO2 24 05/31/2024 0233   GLUCOSE 111 (H) 05/31/2024 0233   BUN 5 (L) 05/31/2024 0233   CREATININE 1.05 (H) 05/31/2024 0233   CREATININE 0.89 05/09/2014 1345   CALCIUM 8.2 (L) 05/31/2024 0233   PROT 7.0 12/24/2020 2335   ALBUMIN 3.7 12/24/2020 2335   AST 15 12/24/2020 2335   ALT 11 12/24/2020 2335   ALKPHOS 51 12/24/2020 2335   BILITOT 0.8 12/24/2020 2335   GFRNONAA >60 05/31/2024 0233   GFRAA >60 09/14/2017 1155   Lipase     Component Value Date/Time   LIPASE 20 06/07/2014 1430    Studies/Results: DG Abd 1 View Result Date: 05/30/2024 CLINICAL DATA:  Nausea and vomiting.  Post open hernia repair. EXAM: ABDOMEN - 1 VIEW COMPARISON:  None Available. FINDINGS: No bowel dilatation or  evidence of obstruction. Moderate formed stool in the right colon, with otherwise small volume of stool in the colon. Right lower quadrant drain is partially visualized in the lateral field of view. Postsurgical change of the soft tissues. No visible radiopaque calculi. Left pelvic phlebolith. IMPRESSION: Normal bowel gas pattern.  Small to moderate colonic stool burden. Electronically Signed   By: Andrea Gasman M.D.   On: 05/30/2024 14:36    Anti-infectives: Anti-infectives (From admission, onward)    Start     Dose/Rate Route Frequency Ordered Stop   05/26/24 0711  ceFAZolin  (ANCEF ) 2-4 GM/100ML-% IVPB       Note to Pharmacy: Junette Massa A: cabinet override      05/26/24 0711 05/26/24 1914       Assessment/Plan 48 y/o F POD 6 from open repair of a right flank hernia  - Replete K this morning. Repeat labs this afternoon.  - Continue to encourage OOB and ambulation - Possible DC this afternoon versus tomorrow  AKI (resolved) - Cr 1.4 POD 1 but has since normalized.    LOS: 6 days   [name] Surgery 06/01/2024, 9:01 AM Please see Amion for pager number during day hours 7:00am-4:30pm or 7:00am -11:30am on weekends

## 2024-06-01 NOTE — Progress Notes (Addendum)
 PIV consult: LUE phlebitis noted. RUE veins too small for PIV cannulation. Midline is contraindicated for K+ runs. Recommend temporary central line if prolonged venous access is needed.

## 2024-06-01 NOTE — Discharge Instructions (Signed)
 Home Care After Complex Abdominal Wall Hernia  Activity  Limit activity for the first 24 hours, then you may return to normal daily activities. Returning to normal daily activities as soon as you can following surgery will enhance recovery time.  No heavy lifting pushing or pulling, anything heavier than 10 pounds (gallon of milk weighs approx. 8.8 pounds) for 12 weeks from surgery date.  Wear abdominal binder for 12 weeks.  Do not mow the lawn, use a vacuum cleaner, or do any other strenuous activities without first consulting your surgical team.  Climb stairs slowly and watch your step.  Walk as often as you feel able to increase strength and endurance.  No driving or operating heavy machinery within 24 hours of taking narcotic pain medication.  Abdominal Core Rehab if referral placed for you. If you think you could benefit from rehab please talk with your surgeon.   For additional information about your recovery and activities you can do to help you recover, please download the [place] [facility]) app to your phone and follow the instructions.  The app can be found by clicking one of the links below, by entering [facility]' in your Appstore, or by opening your camera app and following one of the links in the QR codes below:  Diet  Drink plenty of fluids and eat a light meal on the night of surgery. Some patients may find their appetite is poor for a week or two after surgery. This is a normal result of the stress of surgery-your appetite will return in time.   There are no specific diet restrictions after surgery.  Drinking plenty of fluids and high fiber diet will assist with the prevention of constipation.   Dressings and Wound Care  Keep your wound or incision site clean and dry.  You may have different types of dressings covering your incisions depending on your operation and your surgeon: Dermabond/Durabond (skin glue): This will usually remain in place for  10-14 days, then naturally fall off your skin. You may take a shower 24 hrs after surgery, carefully wash, not scrub the incision site with a mild non-scented soap. Pat dry with a soft towel.  Do not pick or peel skin glue off.  You can shower and let the water  fall on the dressings above. Do not soak or submerge your incision(s) in a bath tub, hot tub, or swimming pool, until your doctor says it is ok to do so or the incision(s) have completely healed, usually about 2-4 weeks.  Do not use creams, powder, salves or balms on your incision(s).  Drain Care  If you are discharged with a drain you will given instructions on changing the dressing around the drain, how to empty the drain, how to clean the skin around the drain tube, and how to prevent clogs in the drain from your nurse.   Remember to empty the drain when it is half full, and measure and record the output every time you empty the drain.  Please bring your daily drain output log with you to your appointments for the surgical team to review.  This log helps determine if the drain is ready to be removed by the surgical team.   Before emptying the drain please remember to look for any clots and clear them from the drain tube by "stripping" or "milking" the tubing. Follow the steps for stripping or milking the tube: o Use one hand to hold and pinch the tube close to  where it leaves the skin. o With the thumb and first finger of your other hand, pinch the tube just below where you are holding it.  o Slowly and firmly push your thumb and first finger down the tubing toward the end of the tube. o Repeat as many times as needed to move the clot.   It is important that the drain bulb stays decompressed or flat as this will create the suction that pulls the fluid into the bulb.  stripping the drain tube to prevent the tube from clogging.  Call your surgeon's office if: o The drainage color has suddenly changed from a light, clear red or pink to bloody  or bright red in color. o The drainage smell has changed. o The drainage has pus (thick yellowish fluid). o There is a lot of fluid around the drain. o Signs of infection, such as sudden increased pain, swelling, warmth, or redness around the drain tube.   Stoma Care  Resume stoma care as previously carried out before your operation, including emptying your pouch as needed, replacing your pouching system as needed (usually every 3 to 7 days), and watch for skin irritation.  The stoma size and shape may have changed after the operation.  Make sure to measure and cut the barrier for appropriate size and fitting.   Monitor for stoma discoloration.   What to Expect After Surgery   Moderate discomfort controlled with medications  Minimal drainage from incision  You may feel pain in one or both shoulders. This pain comes from the gas still left in your belly after the surgery, if you had laparoscopic surgery (several small incisions). The pain should ease over several days to a week. Ambulation will help with this pain.   Belly swelling  Feeling fatigue and weak  Constipation after surgery is common. Drink plenty fluids and eat a high fiber diet.  Swelling - In some patients might feel that their hernia has returned after surgery-DO NOT Worry this is normal. Swelling may be due to the development of a seroma. Seroma is fluid that has built up where the hernia was repaired this is a normal result after surgery and it will slowly reabsorb back into your body over the next several weeks.   Pain Control: Prescribed Narcotic and Non-Narcotic Pain Medication  You will be sent home with two prescriptions. One of them will be for prescription strength acetaminophen  (i.e. Tylenol ) and prescription strength NSAIDs (i.e. celecoxib  (Celebrex ), meloxicam (Mobic), ibuprofen  (Advil ), or naproxen (Aleve)).  One prescription will be an oral narcotic (i.e. oxycodone ) to take as needed for pain control.    As your  pain improves, the narcotic (i.e. oxycodone ) pain medication should be the first pain medication to stop. Narcotic can be addictive and habit forming.  You may also be sent home with or prescribed later, a nerve pain medication, gabapentin  (Neurontin ), if pain control needs are not met with the above.  Pain Control: Opioid Sparing Protocol - Prescribed Non-Narcotic Pain Medication  If you are taking part in the opioid sparing protocol, you will be given three prescriptions.  Two of them will be for prescription strength ibuprofen  (i.e. Advil ) and prescription strength acetaminophen  (i.e. Tylenol ).  The vast majority of patients will just need these two medications.  One prescription will be for a 'rescue' prescription of an oral narcotic (oxycodone ).  You may fill this if needed.  You will alternate taking the ibuprofen  (600mg ) every 6 hours and also the acetaminophen  (650mg ) every  6 hours so that you are taking one of those medications every 3 hours.  For example: o 0800 - take ibuprofen  600mg  o 1100 - take acetaminophen  650mg  o 1400 - take ibuprofen  600mg  o 1700 - take acetaminophen  650mg  o Etc.  Continue taking this alternating pattern of ibuprofen  and acetaminophen  for 3 days  If you cannot take one or the other of these medications, just take the one you can every 6 hours.  If you are comfortable at night, you don't have to wake up and take a medication.  If you are still uncomfortable after taking either ibuprofen  or acetaminophen , try gentle stretching exercise and ice packs (a bag of frozen vegetables works great).  If you are still uncomfortable, you may fill the narcotic prescription of Oxycodone  and take as directed.  Once you have completed these prescriptions, your pain level should be low enough to stop taking medications altogether or just use an over the counter medication (ibuprofen  or acetaminophen ) as needed.   Pain Control: Over the Counter Medications to take as  needed  Acetaminophen /Ibuprofen : Once you are finished with prescribed narcotic and non-narcotic pain medications, your pain level should be low enough to start using over the counter pain relief medications (ibuprofen  or acetaminophen ) as needed.  Colace/Docusate: May be prescribed by your surgeon to prevent constipation caused by the combination of narcotics, effects of anesthesia, and decreased ambulation.  Hold for loose stools or diarrhea. Take 100 mg 1-2 times a day starting tonight.   Fiber: High fiber foods, extra liquids (water  9-13 cups/day) can also assist with constipation. Examples of high fiber foods are fruit, bran. Prune juice and water  are also good liquids to drink.  Milk of Magnesia/Miralax :  If constipated despite take the over the counter stool softeners you may take Milk of Magnesia or Miralax  as directed on bottle to assist with constipation.     Pepcid /Famotidine : May be prescribed while taking naproxen (Aleve) or other NSAIDs such as ibuprofen  (Motrin /Advil ) to prevent stomach upset or Acid-reflux symptoms. Take 1 tablet 1-2 times a day.   **Constipation: The first bowel movement may occur anywhere between 1-5 days after surgery.  As long as you are not nauseated or not having significant abdominal pain this variation is acceptable. Narcotic pain medications can cause constipation increasing discomfort; early discontinuation will assist with bowel management. If constipated despite taking stool softeners, you may take Milk of Magnesia or Miralax  as directed on the bottle.     **Home medications: You may restart your home medications as directed by your respective Primary Care Physician or Surgeon.   When to notify your Doctor or Healthcare Team  Sign of Wound Infection   Fever over 100 degrees.  Wound becomes extremely swollen, shows red streaks, warm to the touch, and/or drainage from the incision site or foul-smelling drainage.  Wound edges separate or opens up  Bleeding  or bruising   If you have bleeding, apply pressure to the site and hold the pressure firmly for 5 minutes. If the bleeding continues, apply pressure again and call 911. If the bleeding stopped, call your doctor to report it.   Call your doctor or nurse if you have increased bleeding from your site and increased bruising or a lump forms or gets larger under your skin at the site.  Unrelieved Pain    Call your doctor or nurse if your pain gets worse or is not eased 1 hour after taking your pain medicine, or if it is severe and  uncontrolled.  Nausea and Vomiting    Call your doctor or nurse if you have nausea and vomiting that continues more than 24 hours, will not let you keep medicine down and will not let you keep fluids down Fever, Flu-like symptoms   Fever over 100 degrees and/or chills  Gastrointestinal Bleeding Symptoms    Black tarry bowel movements.  This can be normal after surgery on the stomach, but should resolve in a day or two.    Call 911 if you suddenly have signs of blood loss such as:  Vomiting blood  Fast heart rate  Feeling faint, sweaty, or blacking out  Passing bright red blood from your rectum  Blood Clot Symptoms  Tender, swollen or reddened areas in your calf muscle or thighs.  Numbness or tingling in your lower leg or calf, or at the top of your leg or groin  Skin on your leg looks pale or blue or feels cold to touch  Chest pain or have trouble breathing, lightheadedness, fast heart rate  Sudden Onset of Symptoms    Call 911 if you suddenly have:  Leg weakness and spasm  Loss of bladder or bowel function  Seizure  Confusion, severe headache, dizziness or feeling unsteady, problems talking, difficulty swallowing, and/or numbness or muscle weakness as these could be signs of a stroke.  Follow up Appointment Your follow up appointment should be scheduled 2-3 weeks after your surgery date.  If you have not previously scheduled for a follow-up visit you can be  scheduled by contacting [phone].

## 2024-06-02 LAB — BASIC METABOLIC PANEL WITH GFR
Anion gap: 11 (ref 5–15)
BUN: 5 mg/dL — ABNORMAL LOW (ref 6–20)
CO2: 22 mmol/L (ref 22–32)
Calcium: 8.1 mg/dL — ABNORMAL LOW (ref 8.9–10.3)
Chloride: 106 mmol/L (ref 98–111)
Creatinine, Ser: 1.01 mg/dL — ABNORMAL HIGH (ref 0.44–1.00)
GFR, Estimated: >60 mL/min (ref >60)
Glucose, Bld: 119 mg/dL — ABNORMAL HIGH (ref 70–99)
Potassium: 3.3 mmol/L — ABNORMAL LOW (ref 3.5–5.1)
Sodium: 139 mmol/L (ref 135–145)

## 2024-06-02 MED ORDER — POTASSIUM CHLORIDE CRYS ER 20 MEQ PO TBCR
40.0000 meq | EXTENDED_RELEASE_TABLET | Freq: Once | ORAL | Status: AC
  Administered 2024-06-02: 40 meq via ORAL
  Filled 2024-06-02: qty 2

## 2024-06-02 MED ORDER — POTASSIUM CHLORIDE CRYS ER 20 MEQ PO TBCR: 40.0000 meq | EXTENDED_RELEASE_TABLET | Freq: Two times a day (BID) | ORAL | 0 refills | Status: AC

## 2024-06-02 MED ORDER — METOPROLOL SUCCINATE ER 25 MG PO TB24: 50.0000 mg | ORAL_TABLET | Freq: Every morning | ORAL | Status: DC

## 2024-06-02 MED ORDER — ONDANSETRON HCL 4 MG PO TABS: 4.0000 mg | ORAL_TABLET | ORAL | 1 refills | Status: AC | PRN

## 2024-06-02 NOTE — Progress Notes (Signed)
    7 Days Post-Op  Subjective: Pain improved. Still does not have much of an appetite but she is drinking and overall doing well.   ROS: See above, otherwise other systems negative  Objective: Vital signs in last 24 hours: Temp:  [98 F (36.7 C)-99.4 F (37.4 C)] 99.4 F (37.4 C) (08/15 0722) Pulse Rate:  [94-104] 94 (08/15 0722) Resp:  [16-17] 16 (08/15 0722) BP: (140-158)/(84-91) 143/91 (08/15 0722) SpO2:  [99 %-100 %] 100 % (08/15 0722) Last BM Date : 06/02/24  Intake/Output from previous day: 08/14 0701 - 08/15 0700 In: -  Out: 70 [Drains:70] Intake/Output this shift: No intake/output data recorded.  PE: Gen: female, NAD Abd: soft, non-distended, incision clean and dry with dermabond intact, JP w/ serous output  Lab Results:  Recent Labs    05/31/24 0233  WBC 6.4  HGB 9.3*  HCT 29.2*  PLT 302   BMET Recent Labs    06/01/24 1212 06/02/24 0722  NA 138 139  K 3.2* 3.3*  CL 103 106  CO2 24 22  GLUCOSE 141* 119*  BUN <5* 5*  CREATININE 1.05* 1.01*  CALCIUM 8.3* 8.1*   PT/INR No results for input(s): LABPROT, INR in the last 72 hours. CMP     Component Value Date/Time   NA 139 06/02/2024 0722   K 3.3 (L) 06/02/2024 0722   CL 106 06/02/2024 0722   CO2 22 06/02/2024 0722   GLUCOSE 119 (H) 06/02/2024 0722   BUN 5 (L) 06/02/2024 0722   CREATININE 1.01 (H) 06/02/2024 0722   CREATININE 0.89 05/09/2014 1345   CALCIUM 8.1 (L) 06/02/2024 0722   PROT 7.0 12/24/2020 2335   ALBUMIN 3.7 12/24/2020 2335   AST 15 12/24/2020 2335   ALT 11 12/24/2020 2335   ALKPHOS 51 12/24/2020 2335   BILITOT 0.8 12/24/2020 2335   GFRNONAA >60 06/02/2024 0722   GFRAA >60 09/14/2017 1155   Lipase     Component Value Date/Time   LIPASE 20 06/07/2014 1430    Studies/Results: No results found.   Anti-infectives: Anti-infectives (From admission, onward)    Start     Dose/Rate Route Frequency Ordered Stop   05/26/24 0711  ceFAZolin  (ANCEF ) 2-4 GM/100ML-% IVPB        Note to Pharmacy: Junette Massa A: cabinet override      05/26/24 0711 05/26/24 1914       Assessment/Plan 48 y/o F POD 7 from open repair of a right flank hernia  - DC to home  AKI (resolved) - Cr 1.4 POD 1 but has since normalized.    LOS: 7 days   [name] [name] Surgery 06/02/2024, 10:14 AM Please see Amion for pager number during day hours 7:00am-4:30pm or 7:00am -11:30am on weekends

## 2024-06-02 NOTE — Plan of Care (Signed)
°  Problem: Education: Goal: Knowledge of General Education information will improve Description: Including pain rating scale, medication(s)/side effects and non-pharmacologic comfort measures Outcome: Progressing   Problem: Activity: Goal: Risk for activity intolerance will decrease Outcome: Progressing   Problem: Nutrition: Goal: Adequate nutrition will be maintained Outcome: Progressing   Problem: Elimination: Goal: Will not experience complications related to bowel motility Outcome: Progressing   Problem: Safety: Goal: Ability to remain free from injury will improve Outcome: Progressing   Problem: Skin Integrity: Goal: Risk for impaired skin integrity will decrease Outcome: Progressing   

## 2024-06-02 NOTE — Progress Notes (Signed)
 Mobility Specialist Progress Note:    06/02/24 1052  Mobility  Activity Ambulated with assistance (In hallway)  Level of Assistance Standby assist, set-up cues, supervision of patient - no hands on  Assistive Device None  Distance Ambulated (ft) 125 ft (x2)  Activity Response Tolerated well  Mobility Referral Yes  Mobility visit 1 Mobility  Mobility Specialist Start Time (ACUTE ONLY) 1034  Mobility Specialist Stop Time (ACUTE ONLY) 1044  Mobility Specialist Time Calculation (min) (ACUTE ONLY) 10 min   Received pt in bed and agreeable to mobility. No physical assistance needed. No c/o. Returned pt to room and left in bed. Personal belongings and call light within reach. All needs met.  Lavanda Pollack Mobility Specialist  Please contact via [employer] or  Rehab Office [phone]

## 2024-06-02 NOTE — Discharge Summary (Signed)
 Physician Discharge Summary  Patient ID: [name] MRN: [id number] DOB/AGE: August 17, 1976 48 y.o.  Admit date: 05/26/2024 Discharge date: 06/02/2024  Admission Diagnoses:  Discharge Diagnoses:  Principal Problem:   Incisional hernia   Discharged Condition: stable  Hospital Course: 48 y/o F w/ a hx of prior right nephrectomy c/b incisional flank hernia who presented for elective hernia repair. She underwent an open hernia repair with mesh on 8/8 and was kept inpatient for post-operative monitoring and pain control. Her course was relatively uncomplicated. She did have an AKI on POD 1 that resolved with hydration. On POD 4 she developed hypokalemia. She was evaluated by PT/OT and deemed to be an appropriate candidate for discharge to home. On POD 7 she was discharged in stable condition. At the time of discharge she was ambulating with assistance, her pain was well controlled on oral meds, and she was tolerating PO and having bowel function. She has 1 JP drain in place  Discharge Exam: Blood pressure (!) 143/91, pulse 94, temperature 99.4 F (37.4 C), temperature source Oral, resp. rate 16, height 5' 5 (1.651 m), weight 118.8 kg, last menstrual period 05/10/2024, SpO2 100%. Gen: female, NAD Abd: soft, non-distended, incision clean and dry, JP with serous output  Disposition: Discharge disposition: 01-Home or Self Care        Allergies as of 06/02/2024       Reactions   Flagyl [metronidazole Hcl] Hives   Ketorolac  Tromethamine  Hives   Metoclopramide Hcl Other (See Comments)   Facial spasm; they thought I was having a stroke   Percocet [oxycodone -acetaminophen ] Hives, Itching   Barium Sulfate Itching        Medication List     TAKE these medications    acetaminophen  325 MG tablet Commonly known as: Tylenol  Take 2 tablets (650 mg total) by mouth every 6 (six) hours for 6 days. What changed:  medication strength how much to take when to take this reasons to  take this   albuterol  108 (90 Base) MCG/ACT inhaler Commonly known as: VENTOLIN  HFA Inhale 1-2 puffs into the lungs every 6 (six) hours as needed for wheezing or shortness of breath. For wheezing   hydrochlorothiazide  25 MG tablet Commonly known as: HYDRODIURIL  Take 25 mg by mouth in the morning.   methocarbamol  500 MG tablet Commonly known as: ROBAXIN  Take 1 tablet (500 mg total) by mouth every 8 (eight) hours as needed for muscle spasms.   metoprolol  succinate 50 MG 24 hr tablet Commonly known as: TOPROL -XL Take 50 mg by mouth in the morning.   nystatin powder Commonly known as: MYCOSTATIN/NYSTOP Apply 1 Application topically 2 (two) times daily as needed (skin irritation.).   ondansetron  4 MG tablet Commonly known as: Zofran  Take 1 tablet (4 mg total) by mouth daily as needed for nausea or vomiting.   oxyCODONE  5 MG immediate release tablet Commonly known as: Oxy IR/ROXICODONE  Take 1-2 tablets (5-10 mg total) by mouth every 4 (four) hours as needed for moderate pain (pain score 4-6).   polyethylene glycol 17 g packet Commonly known as: MIRALAX  / GLYCOLAX  Take 17 g by mouth 2 (two) times daily.   potassium chloride  SA 20 MEQ tablet Commonly known as: KLOR-CON  M Take 2 tablets (40 mEq total) by mouth 2 (two) times daily for 4 days.               Durable Medical Equipment  (From admission, onward)           Start  Ordered   05/29/24 1444  For home use only DME Walker rolling  Once       Question Answer Comment  Walker: With 5 Inch Wheels   Patient needs a walker to treat with the following condition Generalized weakness      05/29/24 1444             Signed: Cordella DELENA Idler 06/02/2024, 10:27 AM

## 2024-06-02 NOTE — Progress Notes (Signed)
 Pt has DC order. AVS was given and explained to pt (husband over the phone). Husband provided transport. Pt forgot to bring the walker home, pt was called in and informed to pick it at the nurse's station.

## 2024-06-03 ENCOUNTER — Emergency Department (HOSPITAL_BASED_OUTPATIENT_CLINIC_OR_DEPARTMENT_OTHER)
Admission: EM | Admit: 2024-06-03 | Discharge: 2024-06-03 | Disposition: A | Discharge status: Home / Self Care | Attending: Emergency Medicine | Admitting: Emergency Medicine

## 2024-06-03 ENCOUNTER — Encounter (HOSPITAL_BASED_OUTPATIENT_CLINIC_OR_DEPARTMENT_OTHER): Payer: Self-pay | Admitting: Emergency Medicine

## 2024-06-03 ENCOUNTER — Other Ambulatory Visit: Payer: Self-pay

## 2024-06-03 DIAGNOSIS — I10 Essential (primary) hypertension: Secondary | ICD-10-CM | POA: Insufficient documentation

## 2024-06-03 DIAGNOSIS — Z5189 Encounter for other specified aftercare: Secondary | ICD-10-CM

## 2024-06-03 DIAGNOSIS — Z4801 Encounter for change or removal of surgical wound dressing: Secondary | ICD-10-CM | POA: Diagnosis present

## 2024-06-03 DIAGNOSIS — J45909 Unspecified asthma, uncomplicated: Secondary | ICD-10-CM | POA: Insufficient documentation

## 2024-06-03 NOTE — ED Triage Notes (Signed)
 Pt c/o waking up to her JP drain removed. States that she had of drainage prior to bed.

## 2024-06-03 NOTE — ED Provider Notes (Signed)
 Emergency Department Provider Note   I have reviewed the triage vital signs and the nursing notes.   HISTORY  Chief Complaint Post-op Problem   HPI [name] is a 48 y.o. female POD 8 s/p open incisional hernia repair with Dr. Polly presents to the ED with her JP drain pulling out tonight while asleep. She denies pain. No fever. She is having BMs. They covered the area with gauze and attempted to reach the general surgery team without success so came to the ED. Drain had put out 20 ml of fluid before bed this evening.   Past Medical History:  Diagnosis Date   Asthma    Bacterial vaginosis    Candidal vaginitis    Chronic UTI    History of kidney stones    Hypertension    Kidney stones    Ovarian cyst    Pyelonephritis, chronic     Review of Systems  Constitutional: No fever/chills Cardiovascular: Denies chest pain. Respiratory: Denies shortness of breath. Gastrointestinal: No abdominal pain.  No nausea, no vomiting.   Skin: Negative for rash. Neurological: Negative for headaches.  ____________________________________________   PHYSICAL EXAM:  VITAL SIGNS: ED Triage Vitals  Encounter Vitals Group     BP 06/03/24 0409 (!) 140/75     Pulse Rate 06/03/24 0409 90     Resp 06/03/24 0409 18     Temp 06/03/24 0409 98.1 F (36.7 C)     Temp src --      SpO2 06/03/24 0409 100 %     Weight 06/03/24 0408 261 lb 14.5 oz (118.8 kg)     Height 06/03/24 0408 5' 5 (1.651 m)   Constitutional: Alert and oriented. Well appearing and in no acute distress. Eyes: Conjunctivae are normal.  Head: Atraumatic. Nose: No congestion/rhinnorhea. Mouth/Throat: Mucous membranes are moist. Neck: No stridor.   Cardiovascular: Normal rate, regular rhythm. Good peripheral circulation. Grossly normal heart sounds.   Respiratory: Normal respiratory effort.  No retractions. Lungs CTAB. Gastrointestinal: Soft and nontender. No distention.  Musculoskeletal: No lower extremity  tenderness nor edema. No gross deformities of extremities. Neurologic:  Normal speech and language. No gross focal neurologic deficits are appreciated.  Skin:  Skin is warm, dry and intact.  Incision in the right lower abdomen is very well-appearing.  No wound dehiscence.  Site of JP drain noted without bleeding or discharge.  ____________________________________________   PROCEDURES  Procedure(s) performed:   Procedures  None  ____________________________________________   INITIAL IMPRESSION / ASSESSMENT AND PLAN / ED COURSE  Pertinent labs & imaging results that were available during my care of the patient were reviewed by me and considered in my medical decision making (see chart for details).   This patient is Presenting for Evaluation of JP drain dislodged, which does require a range of treatment options, and is a complaint that involves a moderate risk of morbidity and mortality.  Medical Decision Making: Summary:  Patient's JP drain has dislodged and patient immediately presented to the ED.  There is no active drainage or bleeding.  The wound is very well-appearing.  I have covered it with a gauze dressing.  Discussed that we cannot replace these at the bedside.  In speaking with the patient it seems like the drainage had significantly decreased.  I advised that she call the general surgery team in the morning to discuss follow-up timeframe.  I do not see any indication for emergent labs or imaging at this time. Contact information for surgery team  provided at discharge.    Patient's presentation is most consistent with acute, uncomplicated illness.   Disposition: discharge  ____________________________________________  FINAL CLINICAL IMPRESSION(S) / ED DIAGNOSES  Final diagnoses:  Visit for wound check    Note:  This document was prepared using Dragon voice recognition software and may include unintentional dictation errors.  Fonda Law, MD, [facility] Emergency  Medicine    Niurka Benecke, Fonda MATSU, MD 06/03/24 [phone]

## 2024-06-03 NOTE — Discharge Instructions (Signed)
 Your JP drain fell out this evening.  We cannot reinsert this in the emergency department.  Keep the area covered with gauze and monitor for drainage.  If you develop significant drainage, bleeding, pain, fever you should return to the emergency department.  Please call your general surgery office in the morning to make them aware of your drain being dislodged.  They can decide if it needs to be replaced at that time.
